# Patient Record
Sex: Male | Born: 1935 | Race: White | Hispanic: No | Marital: Married | Smoking: Never smoker
Health system: Southern US, Community
[De-identification: ages and names within clinical notes are randomized; demographics above are authoritative.]

## PROBLEM LIST (undated history)

## (undated) DIAGNOSIS — H409 Unspecified glaucoma: Secondary | ICD-10-CM

## (undated) DIAGNOSIS — D649 Anemia, unspecified: Secondary | ICD-10-CM

## (undated) DIAGNOSIS — N400 Enlarged prostate without lower urinary tract symptoms: Secondary | ICD-10-CM

## (undated) DIAGNOSIS — Z860100 Personal history of colon polyps, unspecified: Secondary | ICD-10-CM

## (undated) DIAGNOSIS — F419 Anxiety disorder, unspecified: Secondary | ICD-10-CM

## (undated) DIAGNOSIS — S0291XA Unspecified fracture of skull, initial encounter for closed fracture: Secondary | ICD-10-CM

## (undated) DIAGNOSIS — T7840XA Allergy, unspecified, initial encounter: Secondary | ICD-10-CM

## (undated) DIAGNOSIS — K219 Gastro-esophageal reflux disease without esophagitis: Secondary | ICD-10-CM

## (undated) DIAGNOSIS — N329 Bladder disorder, unspecified: Secondary | ICD-10-CM

## (undated) DIAGNOSIS — E785 Hyperlipidemia, unspecified: Secondary | ICD-10-CM

## (undated) DIAGNOSIS — K5790 Diverticulosis of intestine, part unspecified, without perforation or abscess without bleeding: Secondary | ICD-10-CM

## (undated) DIAGNOSIS — I700 Atherosclerosis of aorta: Secondary | ICD-10-CM

## (undated) DIAGNOSIS — E559 Vitamin D deficiency, unspecified: Secondary | ICD-10-CM

## (undated) DIAGNOSIS — IMO0002 Reserved for concepts with insufficient information to code with codable children: Secondary | ICD-10-CM

## (undated) DIAGNOSIS — I10 Essential (primary) hypertension: Secondary | ICD-10-CM

## (undated) DIAGNOSIS — H269 Unspecified cataract: Secondary | ICD-10-CM

## (undated) DIAGNOSIS — I495 Sick sinus syndrome: Secondary | ICD-10-CM

## (undated) DIAGNOSIS — H8309 Labyrinthitis, unspecified ear: Secondary | ICD-10-CM

## (undated) DIAGNOSIS — J309 Allergic rhinitis, unspecified: Secondary | ICD-10-CM

## (undated) DIAGNOSIS — D696 Thrombocytopenia, unspecified: Secondary | ICD-10-CM

## (undated) DIAGNOSIS — D126 Benign neoplasm of colon, unspecified: Secondary | ICD-10-CM

## (undated) HISTORY — DX: Hyperlipidemia, unspecified: E78.5

## (undated) HISTORY — DX: Personal history of colonic polyps: Z86.010

## (undated) HISTORY — DX: Unspecified cataract: H26.9

## (undated) HISTORY — DX: Reserved for concepts with insufficient information to code with codable children: IMO0002

## (undated) HISTORY — PX: TREATMENT FISTULA ANAL: SUR1390

## (undated) HISTORY — DX: Diverticulosis of intestine, part unspecified, without perforation or abscess without bleeding: K57.90

## (undated) HISTORY — DX: Thrombocytopenia, unspecified: D69.6

## (undated) HISTORY — DX: Benign prostatic hyperplasia without lower urinary tract symptoms: N40.0

## (undated) HISTORY — DX: Bladder disorder, unspecified: N32.9

## (undated) HISTORY — DX: Allergic rhinitis, unspecified: J30.9

## (undated) HISTORY — DX: Atherosclerosis of aorta: I70.0

## (undated) HISTORY — PX: COLONOSCOPY: SHX174

## (undated) HISTORY — PX: OTHER SURGICAL HISTORY: SHX169

## (undated) HISTORY — DX: Personal history of colon polyps, unspecified: Z86.0100

## (undated) HISTORY — DX: Benign neoplasm of colon, unspecified: D12.6

## (undated) HISTORY — DX: Unspecified glaucoma: H40.9

## (undated) HISTORY — PX: COLON SURGERY: SHX602

## (undated) HISTORY — DX: Vitamin D deficiency, unspecified: E55.9

## (undated) HISTORY — DX: Allergy, unspecified, initial encounter: T78.40XA

## (undated) HISTORY — DX: Labyrinthitis, unspecified ear: H83.09

## (undated) HISTORY — DX: Unspecified fracture of skull, initial encounter for closed fracture: S02.91XA

## (undated) HISTORY — PX: EYE SURGERY: SHX253

---
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
## 2000-10-10 ENCOUNTER — Ambulatory Visit (HOSPITAL_COMMUNITY): Admission: RE | Admit: 2000-10-10 | Discharge: 2000-10-10 | Payer: Self-pay | Admitting: Family Medicine

## 2000-10-10 ENCOUNTER — Encounter: Payer: Self-pay | Admitting: Family Medicine

## 2001-12-28 ENCOUNTER — Encounter: Payer: Self-pay | Admitting: Family Medicine

## 2001-12-28 ENCOUNTER — Encounter: Admission: RE | Admit: 2001-12-28 | Discharge: 2001-12-28 | Payer: Self-pay | Admitting: Family Medicine

## 2003-04-18 ENCOUNTER — Ambulatory Visit (HOSPITAL_COMMUNITY): Admission: RE | Admit: 2003-04-18 | Discharge: 2003-04-18 | Payer: Self-pay | Admitting: Gastroenterology

## 2008-04-09 ENCOUNTER — Encounter: Payer: Self-pay | Admitting: Internal Medicine

## 2008-04-09 ENCOUNTER — Ambulatory Visit: Payer: Self-pay | Admitting: Internal Medicine

## 2008-04-24 ENCOUNTER — Ambulatory Visit: Payer: Self-pay | Admitting: Internal Medicine

## 2008-04-24 ENCOUNTER — Encounter: Payer: Self-pay | Admitting: Internal Medicine

## 2008-04-24 DIAGNOSIS — Z860100 Personal history of colon polyps, unspecified: Secondary | ICD-10-CM | POA: Insufficient documentation

## 2008-04-25 ENCOUNTER — Encounter: Payer: Self-pay | Admitting: Internal Medicine

## 2010-10-25 ENCOUNTER — Encounter: Admission: RE | Admit: 2010-10-25 | Discharge: 2010-10-25 | Payer: Self-pay | Admitting: Family Medicine

## 2010-11-03 ENCOUNTER — Encounter: Admission: RE | Admit: 2010-11-03 | Discharge: 2010-11-03 | Payer: Self-pay | Admitting: Family Medicine

## 2011-04-22 NOTE — Op Note (Signed)
   Paul Parks, Paul Parks                          ACCOUNT NO.:  [id number]   MEDICAL RECORD NO.:  [id number]                   PATIENT TYPE:  AMB   LOCATION:  ENDO                                 FACILITY:  [facility]   PHYSICIAN:  John C. Madilyn Fireman, M.D.                 DATE OF BIRTH:  09/04/1936   DATE OF PROCEDURE:  04/18/2003  DATE OF DISCHARGE:                                 OPERATIVE REPORT   INDICATIONS FOR PROCEDURE:  Family history of colon cancer in a first-degree  relative.   PROCEDURE:  The patient was placed in the left lateral decubitus position  and placed on the pulse monitor with continuous low-flow oxygen delivered by  nasal cannula.  He was sedated with 50 mcg IV fentanyl and 4 mg IV Versed.  The Olympus video colonoscope was inserted into the rectum and advanced to  the cecum, confirmed by transillumination at McBurney's point and  visualization of the ileocecal valve and appendiceal orifice.  The prep was  excellent.  The cecum, ascending, transverse, descending, and sigmoid colon  all appeared normal with no masses, polyps, diverticula, or other mucosal  abnormalities.  The rectum likewise appeared normal and retroflex view of  the anus revealed no obvious internal hemorrhoids.  The colonoscope was then  withdrawn and the patient returned to the recovery room in stable condition.  He tolerated the procedure well and there were no immediate complications.   IMPRESSION:  Normal colonoscopy.   PLAN:  Repeat colonoscopy in five years based on family history.                                               John C. Madilyn Fireman, M.D.    [facility]MED[facility]  D:  04/18/2003  T:  04/18/2003  Job:  [id number]   cc:   Ernestina Penna, M.D.  [address] [place]  [place] [address]  Fax: [phone]

## 2012-07-24 ENCOUNTER — Other Ambulatory Visit: Payer: Self-pay | Admitting: Ophthalmology

## 2012-07-24 DIAGNOSIS — H5340 Unspecified visual field defects: Secondary | ICD-10-CM

## 2012-07-30 ENCOUNTER — Other Ambulatory Visit: Payer: Self-pay | Admitting: Ophthalmology

## 2012-07-30 DIAGNOSIS — H5340 Unspecified visual field defects: Secondary | ICD-10-CM

## 2012-07-31 ENCOUNTER — Ambulatory Visit
Admission: RE | Admit: 2012-07-31 | Discharge: 2012-07-31 | Disposition: A | Discharge status: Home / Self Care | Payer: Medicare Other | Source: Ambulatory Visit | Attending: Ophthalmology | Admitting: Ophthalmology

## 2012-07-31 DIAGNOSIS — H5340 Unspecified visual field defects: Secondary | ICD-10-CM

## 2012-07-31 MED ORDER — GADOBENATE DIMEGLUMINE 529 MG/ML IV SOLN
17.0000 mL | Freq: Once | INTRAVENOUS | Status: AC | PRN
  Administered 2012-07-31: 17 mL via INTRAVENOUS

## 2013-03-12 ENCOUNTER — Ambulatory Visit (INDEPENDENT_AMBULATORY_CARE_PROVIDER_SITE_OTHER): Payer: Medicare Other

## 2013-03-12 ENCOUNTER — Encounter: Payer: Self-pay | Admitting: Family Medicine

## 2013-03-12 ENCOUNTER — Ambulatory Visit (INDEPENDENT_AMBULATORY_CARE_PROVIDER_SITE_OTHER): Payer: Medicare Other | Admitting: Family Medicine

## 2013-03-12 VITALS — BP 118/60 | HR 69 | Temp 97.3°F | Ht 69.25 in | Wt 196.0 lb

## 2013-03-12 DIAGNOSIS — E785 Hyperlipidemia, unspecified: Secondary | ICD-10-CM | POA: Insufficient documentation

## 2013-03-12 DIAGNOSIS — S81812A Laceration without foreign body, left lower leg, initial encounter: Secondary | ICD-10-CM

## 2013-03-12 DIAGNOSIS — M79609 Pain in unspecified limb: Secondary | ICD-10-CM

## 2013-03-12 DIAGNOSIS — M79605 Pain in left leg: Secondary | ICD-10-CM

## 2013-03-12 DIAGNOSIS — S81809A Unspecified open wound, unspecified lower leg, initial encounter: Secondary | ICD-10-CM

## 2013-03-12 MED ORDER — ROSUVASTATIN CALCIUM 10 MG PO TABS: 10.0000 mg | ORAL_TABLET | Freq: Every day | ORAL | Status: DC

## 2013-03-12 MED ORDER — CEPHALEXIN 500 MG PO CAPS: 500.0000 mg | ORAL_CAPSULE | Freq: Three times a day (TID) | ORAL | Status: DC

## 2013-03-12 NOTE — Patient Instructions (Signed)
Elevate leg as much as possible Cleanse daily with Betadine solution Do not use Neosporin ointment Bactroban ointment or Polysporin ointment would be okay to place on cut

## 2013-03-12 NOTE — Addendum Note (Signed)
Addended by: Gwenith Daily on: 03/12/2013 03:25 PM   Modules accepted: Orders
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
## 2013-03-19 ENCOUNTER — Encounter: Payer: Self-pay | Admitting: Family Medicine

## 2013-03-19 ENCOUNTER — Ambulatory Visit (INDEPENDENT_AMBULATORY_CARE_PROVIDER_SITE_OTHER): Payer: Medicare Other | Admitting: Family Medicine

## 2013-03-19 ENCOUNTER — Ambulatory Visit
Admission: RE | Admit: 2013-03-19 | Discharge: 2013-03-19 | Disposition: A | Discharge status: Home / Self Care | Payer: 59 | Source: Ambulatory Visit | Attending: Family Medicine | Admitting: Family Medicine

## 2013-03-19 VITALS — BP 126/72 | HR 72 | Temp 97.7°F | Ht 69.25 in | Wt 198.6 lb

## 2013-03-19 DIAGNOSIS — M79609 Pain in unspecified limb: Secondary | ICD-10-CM

## 2013-03-19 DIAGNOSIS — M79662 Pain in left lower leg: Secondary | ICD-10-CM

## 2013-03-19 DIAGNOSIS — M79605 Pain in left leg: Secondary | ICD-10-CM

## 2013-03-19 NOTE — Patient Instructions (Signed)
Get Doppler study Elevate leg as directed finish Keflex

## 2013-03-19 NOTE — Progress Notes (Signed)
  Subjective:    Patient ID: Paul Parks, male    DOB: 1936-11-07, 77 y.o.   MRN: [id number]  HPI [name] returns today for a followup of his left leg injury from over a week ago. He indicates that that it is better but there is still a lot of swelling and especially pain after arising in the morning and after arising after sitting.   Review of Systems See above.No  chest pain no shortness of breath.    Objective:   Physical Exam The left lower leg is still swollen and slightly warm to touch. The abrasion appears to be healing without any sign of any infection. There is bruising in the distal toes. There is a good dorsalis pedis pulse. The leg and calf are tender to palpation and to touch    Assessment & Plan:  This is a recheck of the left leg injury  Persistent edema  Persistent pain in calf and leg  We'll schedule venous Dopplers. Continue to elevate leg several times during the day Continue and finish Keflex Will call patient once Doppler results are available

## 2013-05-20 ENCOUNTER — Encounter: Payer: Self-pay | Admitting: Internal Medicine

## 2013-05-30 ENCOUNTER — Encounter: Payer: Self-pay | Admitting: Family Medicine

## 2013-05-30 ENCOUNTER — Ambulatory Visit (INDEPENDENT_AMBULATORY_CARE_PROVIDER_SITE_OTHER): Payer: Medicare Other | Admitting: Family Medicine

## 2013-05-30 VITALS — BP 140/70 | HR 54 | Temp 97.1°F | Ht 69.25 in | Wt 190.0 lb

## 2013-05-30 DIAGNOSIS — J309 Allergic rhinitis, unspecified: Secondary | ICD-10-CM

## 2013-05-30 DIAGNOSIS — N400 Enlarged prostate without lower urinary tract symptoms: Secondary | ICD-10-CM

## 2013-05-30 DIAGNOSIS — E785 Hyperlipidemia, unspecified: Secondary | ICD-10-CM

## 2013-05-30 DIAGNOSIS — Z8739 Personal history of other diseases of the musculoskeletal system and connective tissue: Secondary | ICD-10-CM

## 2013-05-30 DIAGNOSIS — R5383 Other fatigue: Secondary | ICD-10-CM

## 2013-05-30 DIAGNOSIS — H8309 Labyrinthitis, unspecified ear: Secondary | ICD-10-CM

## 2013-05-30 DIAGNOSIS — R5381 Other malaise: Secondary | ICD-10-CM

## 2013-05-30 DIAGNOSIS — H8303 Labyrinthitis, bilateral: Secondary | ICD-10-CM

## 2013-05-30 DIAGNOSIS — E559 Vitamin D deficiency, unspecified: Secondary | ICD-10-CM

## 2013-05-30 LAB — POCT CBC
Granulocyte percent: 50.7 %G (ref 37–80)
HCT, POC: 41.3 % — AB (ref 43.5–53.7)
Hemoglobin: 14.8 g/dL (ref 14.1–18.1)
Lymph, poc: 2.3 (ref 0.6–3.4)
MCHC: 35.9 g/dL — AB (ref 31.8–35.4)
MPV: 7.2 fL (ref 0–99.8)
POC Granulocyte: 2.6 (ref 2–6.9)
RDW, POC: 12.6 %

## 2013-05-30 LAB — HEPATIC FUNCTION PANEL
Albumin: 4.2 g/dL (ref 3.5–5.2)
Alkaline Phosphatase: 47 U/L (ref 39–117)
Bilirubin, Direct: 0.2 mg/dL (ref 0.0–0.3)
Total Bilirubin: 0.8 mg/dL (ref 0.3–1.2)

## 2013-05-30 LAB — BASIC METABOLIC PANEL WITH GFR
Calcium: 9.4 mg/dL (ref 8.4–10.5)
GFR, Est African American: 75 mL/min
GFR, Est Non African American: 65 mL/min
Potassium: 4.9 mEq/L (ref 3.5–5.3)
Sodium: 137 mEq/L (ref 135–145)

## 2013-05-30 MED ORDER — FLUTICASONE PROPIONATE 50 MCG/ACT NA SUSP: NASAL | Status: DC

## 2013-05-30 NOTE — Patient Instructions (Addendum)
Fall precautions discussed Continue current meds and therapeutic lifestyle changes Use Flonase more regularly 1-2 sprays each nostril at bedtime Take Claritin more regularly one daily in the morning We will see if this regular use of these 2 medications helps with the recurrent dizziness
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
## 2013-05-31 LAB — NMR LIPOPROFILE WITH LIPIDS
HDL Size: 9.2 nm (ref >=9.2)
HDL-C: 48 mg/dL (ref >=40)
LDL (calc): 68 mg/dL (ref <100)
LDL Size: 20.4 nm — ABNORMAL LOW (ref >20.5)
LP-IR Score: <25 (ref <=45)
Triglycerides: 100 mg/dL (ref <150)

## 2013-05-31 LAB — VITAMIN D 25 HYDROXY (VIT D DEFICIENCY, FRACTURES): Vit D, 25-Hydroxy: 38 ng/mL (ref 30–89)

## 2013-07-29 ENCOUNTER — Other Ambulatory Visit: Payer: Self-pay | Admitting: Family Medicine

## 2013-09-24 ENCOUNTER — Other Ambulatory Visit: Payer: Self-pay | Admitting: Family Medicine

## 2013-10-01 ENCOUNTER — Ambulatory Visit: Payer: Medicare Other | Admitting: Family Medicine

## 2013-10-08 ENCOUNTER — Ambulatory Visit (INDEPENDENT_AMBULATORY_CARE_PROVIDER_SITE_OTHER): Payer: Medicare Other | Admitting: Family Medicine

## 2013-10-08 ENCOUNTER — Encounter: Payer: Self-pay | Admitting: Family Medicine

## 2013-10-08 ENCOUNTER — Ambulatory Visit (INDEPENDENT_AMBULATORY_CARE_PROVIDER_SITE_OTHER): Payer: Medicare Other

## 2013-10-08 VITALS — BP 120/69 | HR 51 | Temp 98.2°F | Ht 69.25 in | Wt 183.0 lb

## 2013-10-08 DIAGNOSIS — M5137 Other intervertebral disc degeneration, lumbosacral region: Secondary | ICD-10-CM

## 2013-10-08 DIAGNOSIS — H8309 Labyrinthitis, unspecified ear: Secondary | ICD-10-CM

## 2013-10-08 DIAGNOSIS — Z23 Encounter for immunization: Secondary | ICD-10-CM

## 2013-10-08 DIAGNOSIS — M5136 Other intervertebral disc degeneration, lumbar region: Secondary | ICD-10-CM | POA: Insufficient documentation

## 2013-10-08 DIAGNOSIS — E559 Vitamin D deficiency, unspecified: Secondary | ICD-10-CM

## 2013-10-08 DIAGNOSIS — N400 Enlarged prostate without lower urinary tract symptoms: Secondary | ICD-10-CM

## 2013-10-08 DIAGNOSIS — E785 Hyperlipidemia, unspecified: Secondary | ICD-10-CM

## 2013-10-08 LAB — POCT CBC
Lymph, poc: 2.4 (ref 0.6–3.4)
MCH, POC: 30.5 pg (ref 27–31.2)
MCHC: 34.7 g/dL (ref 31.8–35.4)
MCV: 88.1 fL (ref 80–97)
Platelet Count, POC: 134 10*3/uL — AB (ref 142–424)
RBC: 4.8 M/uL (ref 4.69–6.13)
RDW, POC: 12.4 %
WBC: 5.8 10*3/uL (ref 4.6–10.2)

## 2013-10-08 NOTE — Progress Notes (Addendum)
Subjective:    Patient ID: Paul Parks, male    DOB: 02-23-1936, 77 y.o.   MRN: [id number]  HPI Pt here for follow up and management of chronic medical problems. He does complain of urgency. He has a visit scheduled with the urologist in January. He does not complain of any burning.     Patient Active Problem List   Diagnosis Date Noted  . BPH (benign prostatic hypertrophy) 05/30/2013  . Labyrinthitis 05/30/2013  . Hyperlipemia 03/12/2013   Outpatient Encounter Prescriptions as of 10/08/2013  Medication Sig  . aspirin 81 MG tablet Take 81 mg by mouth daily.  . beta carotene w/minerals (OCUVITE) tablet Take 1 tablet by mouth daily.  . cholecalciferol (VITAMIN D) 1000 UNITS tablet Take 1,000 Units by mouth daily.  . fluticasone (FLONASE) 50 MCG/ACT nasal spray INSTILL 1 SPRAY IN EACH NOSTRIL DAILY  . meloxicam (MOBIC) 15 MG tablet TAKE 1/2 TO 1 TABLET BY MOUTH EVERY DAY AFTER MEALS  . rosuvastatin (CRESTOR) 10 MG tablet Take 1 tablet (10 mg total) by mouth daily. 1/2 qd  . dutasteride (AVODART) 0.5 MG capsule Take 0.5 mg by mouth daily. qod    Review of Systems  Constitutional: Negative.   HENT: Negative.   Eyes: Negative.   Respiratory: Negative.   Cardiovascular: Negative.   Gastrointestinal: Negative.   Endocrine: Negative.   Genitourinary: Positive for urgency (dr Annabell Howells- last visit 1/14).  Musculoskeletal: Negative.   Skin: Negative.   Allergic/Immunologic: Negative.   Neurological: Positive for dizziness.  Hematological: Negative.   Psychiatric/Behavioral: Negative.        Objective:   Physical Exam  Nursing note and vitals reviewed. Constitutional: He is oriented to person, place, and time. He appears well-developed and well-nourished. No distress.  HENT:  Head: Normocephalic and atraumatic.  Right Ear: External ear normal.  Left Ear: External ear normal.  Nose: Nose normal.  Mouth/Throat: Oropharynx is clear and moist. No oropharyngeal exudate.  Eyes:  Conjunctivae and EOM are normal. Pupils are equal, round, and reactive to light. Right eye exhibits no discharge. Left eye exhibits no discharge. No scleral icterus.  Neck: Normal range of motion. Neck supple. No tracheal deviation present. No thyromegaly present.  No carotid bruit  Cardiovascular: Normal rate, regular rhythm, normal heart sounds and intact distal pulses.  Exam reveals no gallop and no friction rub.   No murmur heard. At 60 per minute  Pulmonary/Chest: Effort normal and breath sounds normal. No respiratory distress. He has no wheezes. He has no rales. He exhibits no tenderness.  Abdominal: Soft. Bowel sounds are normal. He exhibits no mass. There is no tenderness. There is no rebound and no guarding.  Musculoskeletal: Normal range of motion. He exhibits no edema and no tenderness.  Lymphadenopathy:    He has no cervical adenopathy.  Neurological: He is alert and oriented to person, place, and time. He has normal reflexes. No cranial nerve deficit.  Skin: Skin is warm and dry. No rash noted. No erythema. No pallor.  Psychiatric: He has a normal mood and affect. His behavior is normal. Judgment and thought content normal.   BP 120/69  Pulse 51  Temp(Src) 98.2 F (36.8 C) (Oral)  Ht 5' 9.25" (1.759 m)  Wt 183 lb (83.008 kg)  BMI 26.83 kg/m2  [facility] reading (PRIMARY) by  Dr. Christell Constant; chest x-ray--within normal limits  Assessment & Plan:   1. Hyperlipemia   2. BPH (benign prostatic hypertrophy)   3. Vitamin D deficiency   4. Degenerative disc disease, lumbar   5. Labyrinthitis, unspecified laterality    Orders Placed This Encounter  Procedures  . DG Chest 2 View    Standing Status: Future     Number of Occurrences: 1     Standing Expiration Date: 12/08/2014    Order Specific Question:  Reason for Exam (SYMPTOM  OR DIAGNOSIS REQUIRED)    Answer:  hyperlipidemia    Order Specific Question:  Preferred imaging location?    Answer:   Internal  . Hepatic function panel  . BMP8+EGFR  . NMR, lipoprofile  . Vit D  25 hydroxy (rtn osteoporosis monitoring)  . POCT CBC   Meds ordered this encounter  Medications  . cholecalciferol (VITAMIN D) 1000 UNITS tablet    Sig: Take 1,000 Units by mouth daily.  Marland Kitchen aspirin 81 MG tablet    Sig: Take 81 mg by mouth daily.  . beta carotene w/minerals (OCUVITE) tablet    Sig: Take 1 tablet by mouth daily.   Patient Instructions  Continue current medications. Continue good therapeutic lifestyle changes.  Fall precautions discussed with patient. Follow up as planned and earlier as needed. Take Claritin more regularly Followup with the urologist as planned We will call you with the results of your lab work and chest x-ray when these are available We will also try to arrange an exercise stress test with cardiology     Nyra Capes MD

## 2013-10-08 NOTE — Patient Instructions (Addendum)
Continue current medications. Continue good therapeutic lifestyle changes.  Fall precautions discussed with patient. Follow up as planned and earlier as needed. Take Claritin more regularly Followup with the urologist as planned We will call you with the results of your lab work and chest x-ray when these are available We will also try to arrange an exercise stress test with cardiology

## 2013-10-09 ENCOUNTER — Telehealth: Payer: Self-pay | Admitting: *Deleted

## 2013-10-09 NOTE — Telephone Encounter (Signed)
error 

## 2013-10-10 LAB — BMP8+EGFR
BUN/Creatinine Ratio: 12 (ref 10–22)
BUN: 13 mg/dL (ref 8–27)
CO2: 25 mmol/L (ref 18–29)
Chloride: 99 mmol/L (ref 97–108)
Creatinine, Ser: 1.05 mg/dL (ref 0.76–1.27)
GFR calc non Af Amer: 69 mL/min/{1.73_m2} (ref >59)
Glucose: 95 mg/dL (ref 65–99)
Potassium: 4.9 mmol/L (ref 3.5–5.2)

## 2013-10-10 LAB — HEPATIC FUNCTION PANEL
AST: 21 IU/L (ref 0–40)
Albumin: 4.5 g/dL (ref 3.5–4.8)
Alkaline Phosphatase: 55 IU/L (ref 39–117)
Total Protein: 6.2 g/dL (ref 6.0–8.5)

## 2013-10-10 LAB — NMR, LIPOPROFILE
HDL Cholesterol by NMR: 57 mg/dL (ref >=40)
HDL Particle Number: 30.6 umol/L (ref >=30.5)
LDL Particle Number: 971 nmol/L (ref <1000)
LDLC SERPL CALC-MCNC: 79 mg/dL (ref <100)
LP-IR Score: <25 (ref <=45)

## 2013-10-10 LAB — VITAMIN D 25 HYDROXY (VIT D DEFICIENCY, FRACTURES): Vit D, 25-Hydroxy: 32 ng/mL (ref 30.0–100.0)

## 2013-11-05 ENCOUNTER — Encounter (HOSPITAL_COMMUNITY): Payer: 59

## 2013-11-11 ENCOUNTER — Ambulatory Visit: Payer: Medicare Other | Admitting: Family Medicine

## 2013-11-12 ENCOUNTER — Ambulatory Visit (HOSPITAL_COMMUNITY)
Admission: RE | Admit: 2013-11-12 | Discharge: 2013-11-12 | Disposition: A | Discharge status: Home / Self Care | Payer: 59 | Source: Ambulatory Visit | Attending: Cardiovascular Disease | Admitting: Cardiovascular Disease

## 2013-11-12 DIAGNOSIS — I4949 Other premature depolarization: Secondary | ICD-10-CM | POA: Insufficient documentation

## 2013-11-12 DIAGNOSIS — E785 Hyperlipidemia, unspecified: Secondary | ICD-10-CM

## 2013-12-09 ENCOUNTER — Other Ambulatory Visit: Payer: Self-pay | Admitting: Family Medicine

## 2013-12-11 NOTE — Telephone Encounter (Signed)
Not on med list

## 2013-12-27 ENCOUNTER — Encounter: Payer: Self-pay | Admitting: Internal Medicine

## 2014-01-15 ENCOUNTER — Ambulatory Visit (INDEPENDENT_AMBULATORY_CARE_PROVIDER_SITE_OTHER): Payer: Medicare Other | Admitting: Family Medicine

## 2014-01-15 ENCOUNTER — Encounter: Payer: Self-pay | Admitting: Family Medicine

## 2014-01-15 VITALS — BP 117/72 | HR 55 | Temp 98.6°F | Ht 69.25 in | Wt 188.0 lb

## 2014-01-15 DIAGNOSIS — E785 Hyperlipidemia, unspecified: Secondary | ICD-10-CM

## 2014-01-15 DIAGNOSIS — N400 Enlarged prostate without lower urinary tract symptoms: Secondary | ICD-10-CM

## 2014-01-15 DIAGNOSIS — J069 Acute upper respiratory infection, unspecified: Secondary | ICD-10-CM

## 2014-01-15 DIAGNOSIS — Z139 Encounter for screening, unspecified: Secondary | ICD-10-CM

## 2014-01-15 DIAGNOSIS — J029 Acute pharyngitis, unspecified: Secondary | ICD-10-CM

## 2014-01-15 DIAGNOSIS — E559 Vitamin D deficiency, unspecified: Secondary | ICD-10-CM

## 2014-01-15 LAB — POCT CBC
GRANULOCYTE PERCENT: 40.6 % (ref 37–80)
HEMATOCRIT: 43.9 % (ref 43.5–53.7)
HEMOGLOBIN: 14.9 g/dL (ref 14.1–18.1)
LYMPH, POC: 2.6 (ref 0.6–3.4)
MCH, POC: 30.2 pg (ref 27–31.2)
MCHC: 33.8 g/dL (ref 31.8–35.4)
MCV: 89.2 fL (ref 80–97)
MPV: 7.3 fL (ref 0–99.8)
POC Granulocyte: 1.9 — AB (ref 2–6.9)
POC LYMPH PERCENT: 54.9 %L — AB (ref 10–50)
Platelet Count, POC: 102 10*3/uL — AB (ref 142–424)
RBC: 4.9 M/uL (ref 4.69–6.13)
RDW, POC: 12.6 %
WBC: 4.7 10*3/uL (ref 4.6–10.2)

## 2014-01-15 LAB — POCT INFLUENZA A/B
Influenza A, POC: NEGATIVE
Influenza B, POC: NEGATIVE

## 2014-01-15 MED ORDER — AMOXICILLIN 500 MG PO CAPS: 500.0000 mg | ORAL_CAPSULE | Freq: Three times a day (TID) | ORAL | Status: DC

## 2014-01-15 NOTE — Addendum Note (Signed)
Addended by: Zannie Cove on: 01/15/2014 10:51 AM   Modules accepted: Orders

## 2014-01-15 NOTE — Patient Instructions (Addendum)
Continue current medications. Continue good therapeutic lifestyle changes which include good diet and exercise. Fall precautions discussed with patient. Schedule your flu vaccine if you haven't had it yet If you are over 76 years old - you may need Prevnar 41 or the adult Pneumonia vaccine. Take Tylenol for aches pains and fever Drink plenty of fluids Use Mucinex maximum strength, blue and white in color one twice daily for cough and congestion with a large glass of water Take prescribed medication as directed Continued followup with the urologist Do not forget to return and get your Prevnar vaccine when you're feeling better Get your colonoscopy as planned

## 2014-01-15 NOTE — Addendum Note (Signed)
Addended by: Pollyann Kennedy F on: 01/15/2014 10:00 AM   Modules accepted: Orders

## 2014-01-15 NOTE — Progress Notes (Signed)
Subjective:    Patient ID: Paul Parks, male    DOB: 10-08-1936, 78 y.o.   MRN: [id number]  HPI Pt here for follow up and management of chronic medical problems.         Patient Active Problem List   Diagnosis Date Noted  . Degenerative disc disease, lumbar 10/08/2013  . BPH (benign prostatic hypertrophy) 05/30/2013  . Labyrinthitis 05/30/2013  . Hyperlipemia 03/12/2013   Outpatient Encounter Prescriptions as of 01/15/2014  Medication Sig  . aspirin 81 MG tablet Take 81 mg by mouth daily.  . cholecalciferol (VITAMIN D) 1000 UNITS tablet Take 1,000 Units by mouth daily.  Marland Kitchen dutasteride (AVODART) 0.5 MG capsule Take 0.5 mg by mouth daily. qod  . fluticasone (FLONASE) 50 MCG/ACT nasal spray INSTILL 1 SPRAY IN EACH NOSTRIL DAILY  . meclizine (ANTIVERT) 12.5 MG tablet TAKE 1 TABLET 4 TIMES A DAY WITH MEALS AND AT BEDTIME FOR DIZZINESS  . meloxicam (MOBIC) 15 MG tablet TAKE 1/2 TO 1 TABLET BY MOUTH EVERY DAY AFTER MEALS  . Multiple Vitamin (MULTIVITAMIN WITH MINERALS) TABS tablet Take 1 tablet by mouth daily.  . rosuvastatin (CRESTOR) 10 MG tablet Take 1 tablet (10 mg total) by mouth daily. 1/2 qd  . [DISCONTINUED] beta carotene w/minerals (OCUVITE) tablet Take 1 tablet by mouth daily.    Review of Systems  Constitutional: Negative.  Negative for fever.  HENT: Positive for congestion (x 1 week).   Eyes: Negative.   Respiratory: Positive for cough.   Cardiovascular: Negative.   Gastrointestinal: Negative.   Endocrine: Negative.   Genitourinary: Negative.   Musculoskeletal: Negative.   Skin: Negative.   Allergic/Immunologic: Negative.   Neurological: Negative.   Hematological: Negative.   Psychiatric/Behavioral: Negative.        Objective:   Physical Exam  Nursing note and vitals reviewed. Constitutional: He is oriented to person, place, and time. He appears well-developed and well-nourished. No distress.  Patient feels weak and achy and fatigued today do to a  respiratory infection and head congestion that have been going on for about a week.  HENT:  Head: Normocephalic and atraumatic.  Right Ear: External ear normal.  Left Ear: External ear normal.  Mouth/Throat: Oropharynx is clear and moist. No oropharyngeal exudate.  Patient has nasal congestion bilaterally, a red posterior throat, and bilateral ethmoid and maxillary sinus tenderness.  Eyes: Conjunctivae and EOM are normal. Pupils are equal, round, and reactive to light. Right eye exhibits no discharge. Left eye exhibits no discharge. No scleral icterus.  Neck: Normal range of motion. Neck supple. No thyromegaly present.  No carotid bruits  Cardiovascular: Normal rate, regular rhythm, normal heart sounds and intact distal pulses.  Exam reveals no gallop and no friction rub.   No murmur heard. At 72 per minute  Pulmonary/Chest: Effort normal and breath sounds normal. No respiratory distress. He has no wheezes. He has no rales. He exhibits no tenderness.  Mostly a dry cough. No axillary nodes and no chest wall masses.  Abdominal: Soft. Bowel sounds are normal. He exhibits no mass. There is tenderness (generally slightly tender). There is no rebound and no guarding.  Genitourinary: Penis normal.  No inguinal hernia palpated on either side and no abdominal wall hernia palpated  Musculoskeletal: Normal range of motion. He exhibits no edema and no tenderness.  Lymphadenopathy:    He has no cervical adenopathy.  Neurological: He is alert and oriented to person, place, and time. He has normal reflexes. No cranial nerve deficit.  Skin:  Skin is warm and dry. No rash noted.  Psychiatric: He has a normal mood and affect. His behavior is normal. Judgment and thought content normal.   BP 117/72  Pulse 55  Temp(Src) 98.6 F (37 C) (Oral)  Ht 5' 9.25" (1.759 m)  Wt 188 lb (85.276 kg)  BMI 27.56 kg/m2        Assessment & Plan:  1. BPH (benign prostatic hypertrophy) - POCT CBC  2.  Hyperlipemia - POCT CBC - BMP8+EGFR - Hepatic function panel - NMR, lipoprofile  3. Vitamin D deficiency - POCT CBC - Vit D  25 hydroxy (rtn osteoporosis monitoring)  4. URI (upper respiratory infection) - amoxicillin (AMOXIL) 500 MG capsule; Take 1 capsule (500 mg total) by mouth 3 (three) times daily.  Dispense: 30 capsule; Refill: 0  Patient Instructions  Continue current medications. Continue good therapeutic lifestyle changes which include good diet and exercise. Fall precautions discussed with patient. Schedule your flu vaccine if you haven't had it yet If you are over 33 years old - you may need Prevnar 34 or the adult Pneumonia vaccine. Take Tylenol for aches pains and fever Drink plenty of fluids Use Mucinex maximum strength, blue and white in color one twice daily for cough and congestion with a large glass of water Take prescribed medication as directed Continued followup with the urologist Do not forget to return and get your Prevnar vaccine when you're feeling better Get your colonoscopy as planned   Arrie Senate MD

## 2014-01-18 LAB — HEPATIC FUNCTION PANEL
ALBUMIN: 4.5 g/dL (ref 3.5–4.8)
ALK PHOS: 60 IU/L (ref 39–117)
ALT: 17 IU/L (ref 0–44)
AST: 25 IU/L (ref 0–40)
BILIRUBIN DIRECT: 0.13 mg/dL (ref 0.00–0.40)
BILIRUBIN TOTAL: 0.4 mg/dL (ref 0.0–1.2)
Total Protein: 6.7 g/dL (ref 6.0–8.5)

## 2014-01-18 LAB — BMP8+EGFR
BUN/Creatinine Ratio: 11 (ref 10–22)
BUN: 11 mg/dL (ref 8–27)
CO2: 28 mmol/L (ref 18–29)
CREATININE: 1.04 mg/dL (ref 0.76–1.27)
Calcium: 9.4 mg/dL (ref 8.6–10.2)
Chloride: 98 mmol/L (ref 97–108)
GFR, EST AFRICAN AMERICAN: 81 mL/min/{1.73_m2} (ref >59)
GFR, EST NON AFRICAN AMERICAN: 70 mL/min/{1.73_m2} (ref >59)
Glucose: 95 mg/dL (ref 65–99)
Potassium: 5 mmol/L (ref 3.5–5.2)
SODIUM: 140 mmol/L (ref 134–144)

## 2014-01-18 LAB — NMR, LIPOPROFILE
Cholesterol: 147 mg/dL (ref <200)
HDL CHOLESTEROL BY NMR: 51 mg/dL (ref >=40)
HDL PARTICLE NUMBER: 29.6 umol/L — AB (ref >=30.5)
LDL Particle Number: 1080 nmol/L — ABNORMAL HIGH (ref <1000)
LDL SIZE: 20.4 nm — AB (ref >20.5)
LDLC SERPL CALC-MCNC: 68 mg/dL (ref <100)
LP-IR Score: 41 (ref <=45)
SMALL LDL PARTICLE NUMBER: 581 nmol/L — AB (ref <=527)
Triglycerides by NMR: 140 mg/dL (ref <150)

## 2014-01-18 LAB — VITAMIN D 25 HYDROXY (VIT D DEFICIENCY, FRACTURES): VIT D 25 HYDROXY: 40.9 ng/mL (ref 30.0–100.0)

## 2014-01-29 ENCOUNTER — Ambulatory Visit (AMBULATORY_SURGERY_CENTER): Payer: Self-pay

## 2014-01-29 VITALS — Ht 70.5 in | Wt 183.0 lb

## 2014-01-29 DIAGNOSIS — Z860100 Personal history of colon polyps, unspecified: Secondary | ICD-10-CM

## 2014-01-29 MED ORDER — SUPREP BOWEL PREP KIT 17.5-3.13-1.6 GM/177ML PO SOLN: 1.0000 | Freq: Once | ORAL | Status: DC

## 2014-01-30 ENCOUNTER — Encounter: Payer: Self-pay | Admitting: Internal Medicine

## 2014-02-06 ENCOUNTER — Ambulatory Visit (AMBULATORY_SURGERY_CENTER): Payer: Medicare Other | Admitting: Internal Medicine

## 2014-02-06 ENCOUNTER — Encounter: Payer: Self-pay | Admitting: Internal Medicine

## 2014-02-06 VITALS — BP 127/69 | HR 48 | Temp 98.1°F | Resp 21 | Ht 70.5 in | Wt 183.0 lb

## 2014-02-06 DIAGNOSIS — Z86010 Personal history of colonic polyps: Secondary | ICD-10-CM

## 2014-02-06 DIAGNOSIS — D126 Benign neoplasm of colon, unspecified: Secondary | ICD-10-CM

## 2014-02-06 DIAGNOSIS — K5730 Diverticulosis of large intestine without perforation or abscess without bleeding: Secondary | ICD-10-CM

## 2014-02-06 MED ORDER — SODIUM CHLORIDE 0.9 % IV SOLN: 500.0000 mL | INTRAVENOUS | Status: DC

## 2014-02-06 NOTE — Progress Notes (Signed)
Called to room to assist during endoscopic procedure.  Patient ID and intended procedure confirmed with present staff. Received instructions for my participation in the procedure from the performing physician.  

## 2014-02-06 NOTE — Progress Notes (Signed)
A/ox3 pleased with MAC, report to April RN 

## 2014-02-06 NOTE — Patient Instructions (Addendum)
I found and removed two small polyps from the colon - no signs of cancer. You also have a condition called diverticulosis - common and not usually a problem. Please read the handout provided.  I will let you know pathology results and when to have another routine colonoscopy by mail. Probably to be considered in 5 years.  I appreciate the opportunity to care for you. Gatha Mayer, MD, FACG   YOU HAD AN ENDOSCOPIC PROCEDURE TODAY AT [facility]: Refer to the procedure report that was given to you for any specific questions about what was found during the examination.  If the procedure report does not answer your questions, please call your gastroenterologist to clarify.  If you requested that your care partner not be given the details of your procedure findings, then the procedure report has been included in a sealed envelope for you to review at your convenience later.  YOU SHOULD EXPECT: Some feelings of bloating in the abdomen. Passage of more gas than usual.  Walking can help get rid of the air that was put into your GI tract during the procedure and reduce the bloating. If you had a lower endoscopy (such as a colonoscopy or flexible sigmoidoscopy) you may notice spotting of blood in your stool or on the toilet paper. If you underwent a bowel prep for your procedure, then you may not have a normal bowel movement for a few days.  DIET: Your first meal following the procedure should be a light meal and then it is ok to progress to your normal diet.  A half-sandwich or bowl of soup is an example of a good first meal.  Heavy or fried foods are harder to digest and may make you feel nauseous or bloated.  Likewise meals heavy in dairy and vegetables can cause extra gas to form and this can also increase the bloating.  Drink plenty of fluids but you should avoid alcoholic beverages for 24 hours.  ACTIVITY: Your care partner should take you home directly after the procedure.  You should  plan to take it easy, moving slowly for the rest of the day.  You can resume normal activity the day after the procedure however you should NOT DRIVE or use heavy machinery for 24 hours (because of the sedation medicines used during the test).    SYMPTOMS TO REPORT IMMEDIATELY: A gastroenterologist can be reached at any hour.  During normal business hours, 8:30 AM to 5:00 PM Monday through Friday, call [phone].  After hours and on weekends, please call the GI answering service at [phone] who will take a message and have the physician on call contact you.   Following lower endoscopy (colonoscopy or flexible sigmoidoscopy):  Excessive amounts of blood in the stool  Significant tenderness or worsening of abdominal pains  Swelling of the abdomen that is new, acute  Fever of 100F or higher  FOLLOW UP: If any biopsies were taken you will be contacted by phone or by letter within the next 1-3 weeks.  Call your gastroenterologist if you have not heard about the biopsies in 3 weeks.  Our staff will call the home number listed on your records the next business day following your procedure to check on you and address any questions or concerns that you may have at that time regarding the information given to you following your procedure. This is a courtesy call and so if there is no answer at the home number and we have  heard from you through the emergency physician on call, we will assume that you have returned to your regular daily activities without incident.  SIGNATURES/CONFIDENTIALITY: You and/or your care partner have signed paperwork which will be entered into your electronic medical record.  These signatures attest to the fact that that the information above on your After Visit Summary has been reviewed and is understood.  Full responsibility of the confidentiality of this discharge information lies with you and/or your care-partner. 

## 2014-02-06 NOTE — Op Note (Signed)
[facility]  [address] [place], [id number]   COLONOSCOPY PROCEDURE REPORT  PATIENT: Paul Parks, Paul Parks  MR#: [id number] BIRTHDATE: 1936-11-21 , 77  yrs. old GENDER: Male ENDOSCOPIST: Gatha Mayer, MD, [facility] PROCEDURE DATE:  02/06/2014 PROCEDURE:   Colonoscopy with snare polypectomy First Screening Colonoscopy - Avg.  risk and is 50 yrs.  old or older - No.  Prior Negative Screening - Now for repeat screening. N/A  History of Adenoma - Now for follow-up colonoscopy & has been > or = to 3 yrs.  Yes hx of adenoma.  Has been 3 or more years since last colonoscopy.  Polyps Removed Today? Yes. ASA CLASS:   Class II INDICATIONS:Patient's personal history of adenomatous colon polyps.  MEDICATIONS: propofol (Diprivan) 200mg  IV, MAC sedation, administered by CRNA, and These medications were titrated to patient response per physician's verbal order  DESCRIPTION OF PROCEDURE:   After the risks benefits and alternatives of the procedure were thoroughly explained, informed consent was obtained.  A digital rectal exam revealed no rectal mass.   The LB [id number]  endoscope was introduced through the anus and advanced to the cecum, which was identified by both the appendix and ileocecal valve. No adverse events experienced. The quality of the prep was Suprep good  The instrument was then slowly withdrawn as the colon was fully examined.   COLON FINDINGS: Two diminutive sessile polyps were found at the cecum and in the transverse colon.  A polypectomy was performed with a cold snare.  The resection was complete and the polyp tissue was completely retrieved.   Moderate diverticulosis was noted in the sigmoid colon.   The colon mucosa was otherwise normal.   A right colon retroflexion was performed.  Retroflexed views revealed no abnormalities. The time to cecum=1 minutes 52 seconds. Withdrawal time=11 minutes 53 seconds.  The scope was withdrawn and the procedure  completed. COMPLICATIONS: There were no complications.  ENDOSCOPIC IMPRESSION: 1.   Two diminutive sessile polyps were found at the cecum and in the transverse colon; polypectomy was performed with a cold snare 2.   Moderate diverticulosis was noted in the sigmoid colon 3.   The colon mucosa was otherwise normal - good prep - hx 2 adenomas 2009 and FHx CRCA  RECOMMENDATIONS: 1.  Timing of repeat colonoscopy will be determined by pathology findings. 2.   probably 5 years if remains vigorous  eSigned:  Gatha Mayer, MD, [facility] 02/06/2014 12:26 PM  cc: The Patient

## 2014-02-07 ENCOUNTER — Telehealth: Payer: Self-pay | Admitting: *Deleted

## 2014-02-07 NOTE — Telephone Encounter (Signed)
  Follow up Call-  Call back number 02/06/2014  Post procedure Call Back phone  # [phone]  Permission to leave phone message Yes     Patient questions:  Do you have a fever, pain , or abdominal swelling? no Pain Score  0 *  Have you tolerated food without any problems? yes  Have you been able to return to your normal activities? yes  Do you have any questions about your discharge instructions: Diet   no Medications  no Follow up visit  no  Do you have questions or concerns about your Care? no  Actions: * If pain score is 4 or above: No action needed, pain <4.  Pt. States he has had several bouts of "dizziness" after procedure.  Denies bleeding, abdominal pain, or abdominal distention..  Advised that it was unlikely this could be coming from anesthesia.  Encouraged  to call back if this becomes more of a problem today.

## 2014-02-12 ENCOUNTER — Encounter: Payer: Self-pay | Admitting: Internal Medicine

## 2014-02-12 ENCOUNTER — Ambulatory Visit (INDEPENDENT_AMBULATORY_CARE_PROVIDER_SITE_OTHER): Payer: Medicare Other | Admitting: *Deleted

## 2014-02-12 DIAGNOSIS — Z23 Encounter for immunization: Secondary | ICD-10-CM

## 2014-02-12 NOTE — Progress Notes (Signed)
Quick Note:  2 diminutive adenomas - repeat colon 2020 if fit at 73 ______

## 2014-02-24 ENCOUNTER — Other Ambulatory Visit: Payer: Self-pay | Admitting: Family Medicine

## 2014-05-08 ENCOUNTER — Other Ambulatory Visit: Payer: Self-pay

## 2014-05-19 ENCOUNTER — Encounter (INDEPENDENT_AMBULATORY_CARE_PROVIDER_SITE_OTHER): Payer: Self-pay

## 2014-05-19 ENCOUNTER — Encounter: Payer: Self-pay | Admitting: Family Medicine

## 2014-05-19 ENCOUNTER — Ambulatory Visit (INDEPENDENT_AMBULATORY_CARE_PROVIDER_SITE_OTHER): Payer: Medicare Other | Admitting: Family Medicine

## 2014-05-19 VITALS — BP 115/67 | HR 54 | Temp 98.2°F | Ht 70.5 in | Wt 186.0 lb

## 2014-05-19 DIAGNOSIS — E559 Vitamin D deficiency, unspecified: Secondary | ICD-10-CM

## 2014-05-19 DIAGNOSIS — H409 Unspecified glaucoma: Secondary | ICD-10-CM

## 2014-05-19 DIAGNOSIS — N400 Enlarged prostate without lower urinary tract symptoms: Secondary | ICD-10-CM

## 2014-05-19 DIAGNOSIS — E785 Hyperlipidemia, unspecified: Secondary | ICD-10-CM

## 2014-05-19 LAB — POCT CBC
GRANULOCYTE PERCENT: 53.3 % (ref 37–80)
HCT, POC: 42 % — AB (ref 43.5–53.7)
HEMOGLOBIN: 13.7 g/dL — AB (ref 14.1–18.1)
Lymph, poc: 2.1 (ref 0.6–3.4)
MCH, POC: 29.6 pg (ref 27–31.2)
MCHC: 32.6 g/dL (ref 31.8–35.4)
MCV: 90.8 fL (ref 80–97)
MPV: 7 fL (ref 0–99.8)
PLATELET COUNT, POC: 143 10*3/uL (ref 142–424)
POC Granulocyte: 2.7 (ref 2–6.9)
POC LYMPH PERCENT: 41.3 %L (ref 10–50)
RBC: 4.6 M/uL — AB (ref 4.69–6.13)
RDW, POC: 12.6 %
WBC: 5.1 10*3/uL (ref 4.6–10.2)

## 2014-05-19 NOTE — Addendum Note (Signed)
Addended by: Zannie Cove on: 05/19/2014 09:22 AM   Modules accepted: Orders

## 2014-05-19 NOTE — Progress Notes (Signed)
Subjective:    Patient ID: Paul Parks, male    DOB: Sep 10, 1936, 78 y.o.   MRN: [id number]  HPI Pt here for follow up and management of chronic medical problems. The patient has no specific complaints. He doesn't need any refills. His health maintenance was reviewed and this is up to date. He is to get lab work done today. He recently had a PSA and prostate exam by the urologist. He will see the urologist again in 6 months. He spent a lot of time recently helping to take care of his wife who had been in the hospital and is recuperating from a long stay there.          Patient Active Problem List   Diagnosis Date Noted  . Degenerative disc disease, lumbar 10/08/2013  . BPH (benign prostatic hypertrophy) 05/30/2013  . Labyrinthitis 05/30/2013  . Hyperlipemia 03/12/2013  . Personal history of colonic polyps - adenomas 04/24/2008   Outpatient Encounter Prescriptions as of 05/19/2014  Medication Sig  . aspirin 81 MG tablet Take 81 mg by mouth daily.  . cholecalciferol (VITAMIN D) 1000 UNITS tablet Take 2,000 Units by mouth daily.   Marland Kitchen dutasteride (AVODART) 0.5 MG capsule Take 0.5 mg by mouth daily. qod  . fluticasone (FLONASE) 50 MCG/ACT nasal spray INSTILL 1 SPRAY IN EACH NOSTRIL DAILY  . meclizine (ANTIVERT) 12.5 MG tablet TAKE 1 TABLET 4 TIMES A DAY WITH MEALS AND AT BEDTIME FOR DIZZINESS  . meloxicam (MOBIC) 15 MG tablet TAKE 1/2 TO 1 TABLET BY MOUTH EVERY DAY AFTER MEALS  . Multiple Vitamin (MULTIVITAMIN WITH MINERALS) TABS tablet Take 1 tablet by mouth daily.  . rosuvastatin (CRESTOR) 10 MG tablet Take 1 tablet (10 mg total) by mouth daily. 1/2 qd    Review of Systems  Constitutional: Negative.   HENT: Negative.   Eyes: Negative.   Respiratory: Negative.   Cardiovascular: Negative.   Gastrointestinal: Negative.   Endocrine: Negative.   Genitourinary: Negative.   Musculoskeletal: Negative.   Skin: Negative.   Allergic/Immunologic: Negative.   Neurological: Negative.     Hematological: Negative.   Psychiatric/Behavioral: Negative.        Objective:   Physical Exam  Nursing note and vitals reviewed. Constitutional: He is oriented to person, place, and time. He appears well-developed and well-nourished. No distress.  HENT:  Head: Normocephalic and atraumatic.  Right Ear: External ear normal.  Left Ear: External ear normal.  Mouth/Throat: Oropharynx is clear and moist. No oropharyngeal exudate.  Nasal congestion and turbinate swelling bilaterally  Eyes: Conjunctivae and EOM are normal. Pupils are equal, round, and reactive to light. Right eye exhibits no discharge. Left eye exhibits no discharge. No scleral icterus.  Neck: Normal range of motion. Neck supple. No thyromegaly present.  No carotid bruits  Cardiovascular: Normal rate, regular rhythm, normal heart sounds and intact distal pulses.  Exam reveals no gallop and no friction rub.   No murmur heard. At 72 per minute  Pulmonary/Chest: Effort normal and breath sounds normal. No respiratory distress. He has no wheezes. He has no rales. He exhibits no tenderness.  Abdominal: Soft. Bowel sounds are normal. He exhibits no mass. There is no tenderness. There is no rebound and no guarding.  Musculoskeletal: Normal range of motion. He exhibits no edema and no tenderness.  Lymphadenopathy:    He has no cervical adenopathy.  Neurological: He is alert and oriented to person, place, and time. He has normal reflexes. No cranial nerve deficit.  Skin: Skin is  warm and dry. No rash noted. No erythema. No pallor.  Multiple insect bites on right side of torso and right thigh  Psychiatric: He has a normal mood and affect. His behavior is normal. Judgment and thought content normal.   BP 115/67  Pulse 54  Temp(Src) 98.2 F (36.8 C) (Oral)  Ht 5' 10.5" (1.791 m)  Wt 186 lb (84.369 kg)  BMI 26.30 kg/m2        Assessment & Plan:  1. BPH (benign prostatic hypertrophy) - POCT CBC -Followed by the urologist  every six-month  2. Hyperlipemia - POCT CBC - BMP8+EGFR - Hepatic function panel - NMR, lipoprofile  3. Vitamin D deficiency - Vit D  25 hydroxy (rtn osteoporosis monitoring)  4. Insect bite  5. Allergic rhinitis  6. Chronic back pain secondary to osteoarthritis  Patient Instructions                       Medicare Annual Wellness Visit  [facility] and the medical providers at [facility] strive to bring you the best medical care.  In doing so we not only want to address your current medical conditions and concerns but also to detect new conditions early and prevent illness, disease and health-related problems.    Medicare offers a yearly Wellness Visit which allows our clinical staff to assess your need for preventative services including immunizations, lifestyle education, counseling to decrease risk of preventable diseases and screening for fall risk and other medical concerns.    This visit is provided free of charge (no copay) for all Medicare recipients. The clinical pharmacists at [facility] have begun to conduct these Wellness Visits which will also include a thorough review of all your medications.    As you primary medical provider recommend that you make an appointment for your Annual Wellness Visit if you have not done so already this year.  You may set up this appointment before you leave today or you may call back ([phone]) and schedule an appointment.  Please make sure when you call that you mention that you are scheduling your Annual Wellness Visit with the clinical pharmacist so that the appointment may be made for the proper length of time.      Tick Bite Information Ticks are insects that attach themselves to the skin and draw blood for food. There are various types of ticks. Common types include wood ticks and deer ticks. Most ticks live in shrubs and grassy areas. Ticks can climb onto your body when you make contact  with leaves or grass where the tick is waiting. The most common places on the body for ticks to attach themselves are the scalp, neck, armpits, waist, and groin. Most tick bites are harmless, but sometimes ticks carry germs that cause diseases. These germs can be spread to a person during the tick's feeding process. The chance of a disease spreading through a tick bite depends on:   The type of tick.  Time of year.   How long the tick is attached.   Geographic location.  HOW CAN YOU PREVENT TICK BITES? Take these steps to help prevent tick bites when you are outdoors:  Wear protective clothing. Long sleeves and long pants are best.   Wear white clothes so you can see ticks more easily.  Tuck your pant legs into your socks.   If walking on a trail, stay in the middle of the trail to avoid brushing against bushes.  Avoid walking through areas with long grass.  Put insect repellent on all exposed skin and along boot tops, pant legs, and sleeve cuffs.   Check clothing, hair, and skin repeatedly and before going inside.   Brush off any ticks that are not attached.  Take a shower or bath as soon as possible after being outdoors.  WHAT IS THE PROPER WAY TO REMOVE A TICK? Ticks should be removed as soon as possible to help prevent diseases caused by tick bites. 1. If latex gloves are available, put them on before trying to remove a tick.  2. Using fine-point tweezers, grasp the tick as close to the skin as possible. You may also use curved forceps or a tick removal tool. Grasp the tick as close to its head as possible. Avoid grasping the tick on its body. 3. Pull gently with steady upward pressure until the tick lets go. Do not twist the tick or jerk it suddenly. This may break off the tick's head or mouth parts. 4. Do not squeeze or crush the tick's body. This could force disease-carrying fluids from the tick into your body.  5. After the tick is removed, wash the bite area  and your hands with soap and water or other disinfectant such as alcohol. 6. Apply a small amount of antiseptic cream or ointment to the bite site.  7. Wash and disinfect any instruments that were used.  Do not try to remove a tick by applying a hot match, petroleum jelly, or fingernail polish to the tick. These methods do not work and may increase the chances of disease being spread from the tick bite.  WHEN SHOULD YOU SEEK MEDICAL CARE? Contact your health care provider if you are unable to remove a tick from your skin or if a part of the tick breaks off and is stuck in the skin.  After a tick bite, you need to be aware of signs and symptoms that could be related to diseases spread by ticks. Contact your health care provider if you develop any of the following in the days or weeks after the tick bite:  Unexplained fever.  Rash. A circular rash that appears days or weeks after the tick bite may indicate the possibility of Lyme disease. The rash may resemble a target with a bull's-eye and may occur at a different part of your body than the tick bite.  Redness and swelling in the area of the tick bite.   Tender, swollen lymph glands.   Diarrhea.   Weight loss.   Cough.   Fatigue.   Muscle, joint, or bone pain.   Abdominal pain.   Headache.   Lethargy or a change in your level of consciousness.  Difficulty walking or moving your legs.   Numbness in the legs.   Paralysis.  Shortness of breath.   Confusion.   Repeated vomiting.  Document Released: 11/18/2000 Document Revised: 09/11/2013 Document Reviewed: 05/01/2013 [facility] Patient Information 2014 [place].   Continue current medications. Continue good therapeutic lifestyle changes which include good diet and exercise. Fall precautions discussed with patient. If an FOBT was given today- please return it to our front desk. If you are over 39 years old - you may need Prevnar 70 or the adult  Pneumonia vaccine.  Monitor yourself daily for ticks   Arrie Senate MD

## 2014-05-19 NOTE — Patient Instructions (Addendum)
Medicare Annual Wellness Visit  [facility] and the medical providers at [facility] strive to bring you the best medical care.  In doing so we not only want to address your current medical conditions and concerns but also to detect new conditions early and prevent illness, disease and health-related problems.    Medicare offers a yearly Wellness Visit which allows our clinical staff to assess your need for preventative services including immunizations, lifestyle education, counseling to decrease risk of preventable diseases and screening for fall risk and other medical concerns.    This visit is provided free of charge (no copay) for all Medicare recipients. The clinical pharmacists at [facility] have begun to conduct these Wellness Visits which will also include a thorough review of all your medications.    As you primary medical provider recommend that you make an appointment for your Annual Wellness Visit if you have not done so already this year.  You may set up this appointment before you leave today or you may call back ([phone]) and schedule an appointment.  Please make sure when you call that you mention that you are scheduling your Annual Wellness Visit with the clinical pharmacist so that the appointment may be made for the proper length of time.      Tick Bite Information Ticks are insects that attach themselves to the skin and draw blood for food. There are various types of ticks. Common types include wood ticks and deer ticks. Most ticks live in shrubs and grassy areas. Ticks can climb onto your body when you make contact with leaves or grass where the tick is waiting. The most common places on the body for ticks to attach themselves are the scalp, neck, armpits, waist, and groin. Most tick bites are harmless, but sometimes ticks carry germs that cause diseases. These germs can be spread to a person during the tick's  feeding process. The chance of a disease spreading through a tick bite depends on:   The type of tick.  Time of year.   How long the tick is attached.   Geographic location.  HOW CAN YOU PREVENT TICK BITES? Take these steps to help prevent tick bites when you are outdoors:  Wear protective clothing. Long sleeves and long pants are best.   Wear white clothes so you can see ticks more easily.  Tuck your pant legs into your socks.   If walking on a trail, stay in the middle of the trail to avoid brushing against bushes.  Avoid walking through areas with long grass.  Put insect repellent on all exposed skin and along boot tops, pant legs, and sleeve cuffs.   Check clothing, hair, and skin repeatedly and before going inside.   Brush off any ticks that are not attached.  Take a shower or bath as soon as possible after being outdoors.  WHAT IS THE PROPER WAY TO REMOVE A TICK? Ticks should be removed as soon as possible to help prevent diseases caused by tick bites. 1. If latex gloves are available, put them on before trying to remove a tick.  2. Using fine-point tweezers, grasp the tick as close to the skin as possible. You may also use curved forceps or a tick removal tool. Grasp the tick as close to its head as possible. Avoid grasping the tick on its body. 3. Pull gently with steady upward pressure until the tick lets go. Do not twist the tick or jerk it suddenly. This  may break off the tick's head or mouth parts. 4. Do not squeeze or crush the tick's body. This could force disease-carrying fluids from the tick into your body.  5. After the tick is removed, wash the bite area and your hands with soap and water or other disinfectant such as alcohol. 6. Apply a small amount of antiseptic cream or ointment to the bite site.  7. Wash and disinfect any instruments that were used.  Do not try to remove a tick by applying a hot match, petroleum jelly, or fingernail polish to  the tick. These methods do not work and may increase the chances of disease being spread from the tick bite.  WHEN SHOULD YOU SEEK MEDICAL CARE? Contact your health care provider if you are unable to remove a tick from your skin or if a part of the tick breaks off and is stuck in the skin.  After a tick bite, you need to be aware of signs and symptoms that could be related to diseases spread by ticks. Contact your health care provider if you develop any of the following in the days or weeks after the tick bite:  Unexplained fever.  Rash. A circular rash that appears days or weeks after the tick bite may indicate the possibility of Lyme disease. The rash may resemble a target with a bull's-eye and may occur at a different part of your body than the tick bite.  Redness and swelling in the area of the tick bite.   Tender, swollen lymph glands.   Diarrhea.   Weight loss.   Cough.   Fatigue.   Muscle, joint, or bone pain.   Abdominal pain.   Headache.   Lethargy or a change in your level of consciousness.  Difficulty walking or moving your legs.   Numbness in the legs.   Paralysis.  Shortness of breath.   Confusion.   Repeated vomiting.  Document Released: 11/18/2000 Document Revised: 09/11/2013 Document Reviewed: 05/01/2013 [facility] Patient Information 2014 [place].   Continue current medications. Continue good therapeutic lifestyle changes which include good diet and exercise. Fall precautions discussed with patient. If an FOBT was given today- please return it to our front desk. If you are over 85 years old - you may need Prevnar 44 or the adult Pneumonia vaccine.  Monitor yourself daily for ticks

## 2014-05-20 LAB — NMR, LIPOPROFILE
Cholesterol: 163 mg/dL (ref 100–199)
HDL CHOLESTEROL BY NMR: 63 mg/dL (ref >39)
HDL Particle Number: 32 umol/L (ref >=30.5)
LDL Particle Number: 949 nmol/L (ref <1000)
LDL Size: 21.1 nm (ref >20.5)
LDLC SERPL CALC-MCNC: 84 mg/dL (ref 0–99)
LP-IR Score: <25 (ref <=45)
SMALL LDL PARTICLE NUMBER: 188 nmol/L (ref <=527)
TRIGLYCERIDES BY NMR: 78 mg/dL (ref 0–149)

## 2014-05-20 LAB — BMP8+EGFR
BUN / CREAT RATIO: 13 (ref 10–22)
BUN: 14 mg/dL (ref 8–27)
CO2: 26 mmol/L (ref 18–29)
CREATININE: 1.09 mg/dL (ref 0.76–1.27)
Calcium: 9.3 mg/dL (ref 8.6–10.2)
Chloride: 99 mmol/L (ref 97–108)
GFR calc Af Amer: 76 mL/min/{1.73_m2} (ref >59)
GFR, EST NON AFRICAN AMERICAN: 66 mL/min/{1.73_m2} (ref >59)
Glucose: 88 mg/dL (ref 65–99)
Potassium: 4.5 mmol/L (ref 3.5–5.2)
Sodium: 137 mmol/L (ref 134–144)

## 2014-05-20 LAB — HEPATIC FUNCTION PANEL
ALT: 14 IU/L (ref 0–44)
AST: 23 IU/L (ref 0–40)
Albumin: 4.5 g/dL (ref 3.5–4.8)
Alkaline Phosphatase: 52 IU/L (ref 39–117)
Bilirubin, Direct: 0.19 mg/dL (ref 0.00–0.40)
Total Bilirubin: 0.8 mg/dL (ref 0.0–1.2)
Total Protein: 6.3 g/dL (ref 6.0–8.5)

## 2014-05-20 LAB — VITAMIN D 25 HYDROXY (VIT D DEFICIENCY, FRACTURES): Vit D, 25-Hydroxy: 35.9 ng/mL (ref 30.0–100.0)

## 2014-06-13 ENCOUNTER — Ambulatory Visit: Payer: Self-pay

## 2014-07-23 ENCOUNTER — Encounter (INDEPENDENT_AMBULATORY_CARE_PROVIDER_SITE_OTHER): Payer: Self-pay

## 2014-07-23 ENCOUNTER — Encounter: Payer: Self-pay | Admitting: Family

## 2014-07-23 ENCOUNTER — Ambulatory Visit (INDEPENDENT_AMBULATORY_CARE_PROVIDER_SITE_OTHER): Payer: Medicare Other | Admitting: Family

## 2014-07-23 VITALS — BP 116/64 | HR 56 | Temp 97.6°F | Ht 70.5 in | Wt 187.4 lb

## 2014-07-23 DIAGNOSIS — S2095XA Superficial foreign body of unspecified parts of thorax, initial encounter: Secondary | ICD-10-CM

## 2014-07-23 DIAGNOSIS — Z1839 Other retained organic fragments: Secondary | ICD-10-CM

## 2014-07-23 DIAGNOSIS — W57XXXA Bitten or stung by nonvenomous insect and other nonvenomous arthropods, initial encounter: Secondary | ICD-10-CM

## 2014-07-23 DIAGNOSIS — T148 Other injury of unspecified body region: Secondary | ICD-10-CM

## 2014-07-23 DIAGNOSIS — S30851A Superficial foreign body of abdominal wall, initial encounter: Secondary | ICD-10-CM

## 2014-07-23 MED ORDER — DOXYCYCLINE HYCLATE 100 MG PO TABS: 100.0000 mg | ORAL_TABLET | Freq: Two times a day (BID) | ORAL | Status: DC

## 2014-07-23 NOTE — Progress Notes (Signed)
   Subjective:    Patient ID: Paul Parks, male    DOB: [birth year], 78 y.o.   MRN: [id number]  HPI Pt states he has a tick embedded in his lower left abdomen that he noticed Monday night. Pt states his wife attempted to remove it and thought she had it all out. However, yesterday after getting out of the shower pt states he saw something else in it and it is still red. Pt states it is slightly sore 1-2 out 10.    Review of Systems  Constitutional: Negative.   HENT: Negative.   Respiratory: Negative.   Cardiovascular: Negative.   Gastrointestinal: Negative.   Endocrine: Negative.   Genitourinary: Negative.   Musculoskeletal: Negative.   Neurological: Negative.   Hematological: Negative.   Psychiatric/Behavioral: Negative.   All other systems reviewed and are negative.      Objective:   Physical Exam  Vitals reviewed. Constitutional: He is oriented to person, place, and time. He appears well-developed and well-nourished. No distress.  Cardiovascular: Normal rate, regular rhythm, normal heart sounds and intact distal pulses.   No murmur heard. Pulmonary/Chest: Effort normal and breath sounds normal. No respiratory distress. He has no wheezes.  Abdominal: Soft. Bowel sounds are normal. He exhibits no distension. There is no tenderness.  Musculoskeletal: Normal range of motion. He exhibits no edema and no tenderness.  Neurological: He is alert and oriented to person, place, and time. He has normal reflexes. No cranial nerve deficit.  Skin: Skin is warm and dry. No rash noted. There is erythema.  Small erythemas area with a small scabbed area on lower left abdomen, area cleaned and debrided and antibiotic ointment applied   Psychiatric: He has a normal mood and affect. His behavior is normal. Judgment and thought content normal.    BP 116/64  Pulse 56  Temp(Src) 97.6 F (36.4 C) (Oral)  Ht 5' 10.5" (1.791 m)  Wt 187 lb 6.4 oz (85.004 kg)  BMI 26.50 kg/m2       Assessment  & Plan:  1. Tick bite with subsequent removal of tick -Discussed s/s of infection -Report any rash, pain, joint pain, or fever -Do not pick at area -keep clean and dry -RTo prn - doxycycline (VIBRA-TABS) 100 MG tablet; Take 1 tablet (100 mg total) by mouth 2 (two) times daily.  Dispense: 42 tablet; Refill: 0  2. Embedded tick of abdominal wall, initial encounter - doxycycline (VIBRA-TABS) 100 MG tablet; Take 1 tablet (100 mg total) by mouth 2 (two) times daily.  Dispense: 42 tablet; Refill: 0  Evelina Dun, FNP

## 2014-07-23 NOTE — Patient Instructions (Signed)
Tick Bite Information Ticks are insects that attach themselves to the skin and draw blood for food. There are various types of ticks. Common types include wood ticks and deer ticks. Most ticks live in shrubs and grassy areas. Ticks can climb onto your body when you make contact with leaves or grass where the tick is waiting. The most common places on the body for ticks to attach themselves are the scalp, neck, armpits, waist, and groin. Most tick bites are harmless, but sometimes ticks carry germs that cause diseases. These germs can be spread to a person during the tick's feeding process. The chance of a disease spreading through a tick bite depends on:   The type of tick.  Time of year.   How long the tick is attached.   Geographic location.  HOW CAN YOU PREVENT TICK BITES? Take these steps to help prevent tick bites when you are outdoors:  Wear protective clothing. Long sleeves and long pants are best.   Wear white clothes so you can see ticks more easily.  Tuck your pant legs into your socks.   If walking on a trail, stay in the middle of the trail to avoid brushing against bushes.  Avoid walking through areas with long grass.  Put insect repellent on all exposed skin and along boot tops, pant legs, and sleeve cuffs.   Check clothing, hair, and skin repeatedly and before going inside.   Brush off any ticks that are not attached.  Take a shower or bath as soon as possible after being outdoors.  WHAT IS THE PROPER WAY TO REMOVE A TICK? Ticks should be removed as soon as possible to help prevent diseases caused by tick bites. 1. If latex gloves are available, put them on before trying to remove a tick.  2. Using fine-point tweezers, grasp the tick as close to the skin as possible. You may also use curved forceps or a tick removal tool. Grasp the tick as close to its head as possible. Avoid grasping the tick on its body. 3. Pull gently with steady upward pressure until  the tick lets go. Do not twist the tick or jerk it suddenly. This may break off the tick's head or mouth parts. 4. Do not squeeze or crush the tick's body. This could force disease-carrying fluids from the tick into your body.  5. After the tick is removed, wash the bite area and your hands with soap and water or other disinfectant such as alcohol. 6. Apply a small amount of antiseptic cream or ointment to the bite site.  7. Wash and disinfect any instruments that were used.  Do not try to remove a tick by applying a hot match, petroleum jelly, or fingernail polish to the tick. These methods do not work and may increase the chances of disease being spread from the tick bite.  WHEN SHOULD YOU SEEK MEDICAL CARE? Contact your health care provider if you are unable to remove a tick from your skin or if a part of the tick breaks off and is stuck in the skin.  After a tick bite, you need to be aware of signs and symptoms that could be related to diseases spread by ticks. Contact your health care provider if you develop any of the following in the days or weeks after the tick bite:  Unexplained fever.  Rash. A circular rash that appears days or weeks after the tick bite may indicate the possibility of Lyme disease. The rash may resemble   a target with a bull's-eye and may occur at a different part of your body than the tick bite.  Redness and swelling in the area of the tick bite.   Tender, swollen lymph glands.   Diarrhea.   Weight loss.   Cough.   Fatigue.   Muscle, joint, or bone pain.   Abdominal pain.   Headache.   Lethargy or a change in your level of consciousness.  Difficulty walking or moving your legs.   Numbness in the legs.   Paralysis.  Shortness of breath.   Confusion.   Repeated vomiting.  Document Released: 11/18/2000 Document Revised: 09/11/2013 Document Reviewed: 05/01/2013 Exit[employer] Patient Information 2015 [employer]. This information is  not intended to replace advice given to you by your health care provider. Make sure you discuss any questions you have with your health care provider.  

## 2014-09-24 ENCOUNTER — Ambulatory Visit (INDEPENDENT_AMBULATORY_CARE_PROVIDER_SITE_OTHER): Payer: Medicare Other | Admitting: Family Medicine

## 2014-09-24 ENCOUNTER — Encounter: Payer: Self-pay | Admitting: Family Medicine

## 2014-09-24 VITALS — BP 102/63 | HR 55 | Temp 97.9°F | Ht 70.5 in | Wt 183.0 lb

## 2014-09-24 DIAGNOSIS — N400 Enlarged prostate without lower urinary tract symptoms: Secondary | ICD-10-CM

## 2014-09-24 DIAGNOSIS — E559 Vitamin D deficiency, unspecified: Secondary | ICD-10-CM

## 2014-09-24 DIAGNOSIS — J019 Acute sinusitis, unspecified: Secondary | ICD-10-CM

## 2014-09-24 DIAGNOSIS — D696 Thrombocytopenia, unspecified: Secondary | ICD-10-CM

## 2014-09-24 DIAGNOSIS — E785 Hyperlipidemia, unspecified: Secondary | ICD-10-CM

## 2014-09-24 DIAGNOSIS — M5136 Other intervertebral disc degeneration, lumbar region: Secondary | ICD-10-CM

## 2014-09-24 DIAGNOSIS — J029 Acute pharyngitis, unspecified: Secondary | ICD-10-CM

## 2014-09-24 LAB — POCT CBC
Granulocyte percent: 60 %G (ref 37–80)
HEMATOCRIT: 42.5 % — AB (ref 43.5–53.7)
HEMOGLOBIN: 14 g/dL — AB (ref 14.1–18.1)
Lymph, poc: 2 (ref 0.6–3.4)
MCH, POC: 29.6 pg (ref 27–31.2)
MCHC: 32.9 g/dL (ref 31.8–35.4)
MCV: 89.9 fL (ref 80–97)
MPV: 7.2 fL (ref 0–99.8)
POC Granulocyte: 3.2 (ref 2–6.9)
POC LYMPH PERCENT: 36.8 %L (ref 10–50)
Platelet Count, POC: 131 10*3/uL — AB (ref 142–424)
RBC: 4.7 M/uL (ref 4.69–6.13)
RDW, POC: 12.4 %
WBC: 5.4 10*3/uL (ref 4.6–10.2)

## 2014-09-24 LAB — POCT RAPID STREP A (OFFICE): Rapid Strep A Screen: NEGATIVE

## 2014-09-24 MED ORDER — AMOXICILLIN 500 MG PO CAPS: 500.0000 mg | ORAL_CAPSULE | Freq: Three times a day (TID) | ORAL | Status: DC

## 2014-09-24 NOTE — Progress Notes (Addendum)
Subjective:    Patient ID: Paul Parks, male    DOB: 08-13-1936, 78 y.o.   MRN: [id number]  HPI Pt here for follow up and management of chronic medical problems. In addition to his regular problems, the patient does complain of fever headache congestion malaise and cough. He is due to get his flu shot but will come back after the current illness is better. He will get lab work today. He sees the urologist yearly and plans to see him in November. The patient also complains of some left lower quadrant pain which has been ongoing off and on .      Patient Active Problem List   Diagnosis Date Noted  . Degenerative disc disease, lumbar 10/08/2013  . BPH (benign prostatic hypertrophy) 05/30/2013  . Labyrinthitis 05/30/2013  . Hyperlipemia 03/12/2013  . Personal history of colonic polyps - adenomas 04/24/2008   Outpatient Encounter Prescriptions as of 09/24/2014  Medication Sig  . aspirin 81 MG tablet Take 81 mg by mouth daily.  . cholecalciferol (VITAMIN D) 1000 UNITS tablet Take 2,000 Units by mouth daily.   Marland Kitchen dutasteride (AVODART) 0.5 MG capsule Take 0.5 mg by mouth daily. qod  . fluticasone (FLONASE) 50 MCG/ACT nasal spray INSTILL 1 SPRAY IN EACH NOSTRIL DAILY  . meclizine (ANTIVERT) 12.5 MG tablet TAKE 1 TABLET 4 TIMES A DAY WITH MEALS AND AT BEDTIME FOR DIZZINESS  . meloxicam (MOBIC) 15 MG tablet TAKE 1/2 TO 1 TABLET BY MOUTH EVERY DAY AFTER MEALS  . Multiple Vitamin (MULTIVITAMIN WITH MINERALS) TABS tablet Take 1 tablet by mouth daily.  . rosuvastatin (CRESTOR) 10 MG tablet Take 1 tablet (10 mg total) by mouth daily. 1/2 qd  . [DISCONTINUED] doxycycline (VIBRA-TABS) 100 MG tablet Take 1 tablet (100 mg total) by mouth 2 (two) times daily.    Review of Systems  Constitutional: Positive for fever (had, now better) and fatigue.  HENT: Positive for congestion, postnasal drip and sore throat.   Eyes: Negative.   Respiratory: Positive for cough (slight).   Cardiovascular: Negative.    Gastrointestinal: Negative.   Endocrine: Negative.   Genitourinary: Negative.   Musculoskeletal: Negative.   Skin: Negative.   Allergic/Immunologic: Negative.   Neurological: Negative.   Hematological: Negative.   Psychiatric/Behavioral: Negative.        Objective:   Physical Exam  Nursing note and vitals reviewed. Constitutional: He is oriented to person, place, and time. He appears well-developed and well-nourished. No distress.  HENT:  Head: Normocephalic and atraumatic.  Right Ear: External ear normal.  Left Ear: External ear normal.  Mouth/Throat: Oropharynx is clear and moist. No oropharyngeal exudate.  Turbinate swelling and edema and redness Nasal congestion bilaterally  Eyes: Conjunctivae and EOM are normal. Pupils are equal, round, and reactive to light. Right eye exhibits no discharge. Left eye exhibits no discharge. No scleral icterus.  Neck: Normal range of motion. Neck supple. No thyromegaly present.  Cardiovascular: Normal rate, regular rhythm and normal heart sounds.  Exam reveals no gallop and no friction rub.   No murmur heard. At 72 per minute  Pulmonary/Chest: Effort normal and breath sounds normal. No respiratory distress. He has no wheezes. He has no rales. He exhibits no tenderness.  Minimal congestion with coughing  Abdominal: Soft. Bowel sounds are normal. He exhibits no mass. There is no tenderness. There is no rebound and no guarding.  Genitourinary: Rectum normal and penis normal.  There is a left lower quadrant abdominal wall weakness but without bulging. There were  no inguinal hernias palpated. The testicles appear normal.  Musculoskeletal: Normal range of motion. He exhibits no edema.  Lymphadenopathy:    He has no cervical adenopathy.  Neurological: He is alert and oriented to person, place, and time. He has normal reflexes. No cranial nerve deficit.  Skin: Skin is warm and dry. No rash noted. No erythema. No pallor.  Psychiatric: He has a  normal mood and affect. His behavior is normal. Judgment and thought content normal.   BP 102/63  Pulse 55  Temp(Src) 97.9 F (36.6 C) (Oral)  Ht 5' 10.5" (1.791 m)  Wt 183 lb (83.008 kg)  BMI 25.88 kg/m2  Results for orders placed in visit on 09/24/14  POCT RAPID STREP A (OFFICE)      Result Value Ref Range   Rapid Strep A Screen Negative  Negative         Assessment & Plan:  1. Hyperlipemia - POCT CBC - BMP8+EGFR - Hepatic function panel - NMR, lipoprofile  2. BPH (benign prostatic hypertrophy) - POCT CBC  3. Sore throat - POCT rapid strep A - Strep A culture, throat - POCT CBC  4. Vitamin D deficiency - Vit D  25 hydroxy (rtn osteoporosis monitoring)  5. Acute rhinosinusitis - amoxicillin (AMOXIL) 500 MG capsule; Take 1 capsule (500 mg total) by mouth 3 (three) times daily.  Dispense: 30 capsule; Refill: 0  6. Degenerative disc disease, lumbar  7. Thrombocytopenia  Meds ordered this encounter  Medications  . amoxicillin (AMOXIL) 500 MG capsule    Sig: Take 1 capsule (500 mg total) by mouth 3 (three) times daily.    Dispense:  30 capsule    Refill:  0   Patient Instructions                       Medicare Annual Wellness Visit  [facility] and the medical providers at [facility] strive to bring you the best medical care.  In doing so we not only want to address your current medical conditions and concerns but also to detect new conditions early and prevent illness, disease and health-related problems.    Medicare offers a yearly Wellness Visit which allows our clinical staff to assess your need for preventative services including immunizations, lifestyle education, counseling to decrease risk of preventable diseases and screening for fall risk and other medical concerns.    This visit is provided free of charge (no copay) for all Medicare recipients. The clinical pharmacists at [facility] have begun to conduct  these Wellness Visits which will also include a thorough review of all your medications.    As you primary medical provider recommend that you make an appointment for your Annual Wellness Visit if you have not done so already this year.  You may set up this appointment before you leave today or you may call back ([phone]) and schedule an appointment.  Please make sure when you call that you mention that you are scheduling your Annual Wellness Visit with the clinical pharmacist so that the appointment may be made for the proper length of time.     Continue current medications. Continue good therapeutic lifestyle changes which include good diet and exercise. Fall precautions discussed with patient. If an FOBT was given today- please return it to our front desk. If you are over 50 years old - you may need Prevnar 13 or the adult Pneumonia vaccine.  Flu Shots will be available at our   office starting mid- September. Please call and schedule a FLU CLINIC APPOINTMENT.   Take antibiotics as directed Gargle with warm salty water Take Mucinex maximum strength, blue and white in color, over-the-counter, one twice daily for cough and congestion  Use saline nose spray frequently Drink plenty of fluids If problems with left lower quadrant abdominal pain continued call back and we will arrange to get a CT scan of your abdomen to see if there is an inguinal hernia or direct hernia present. When feeling better from this upper respiratory infection, please return to clinic and get your flu shot   Don W. Moore MD   

## 2014-09-24 NOTE — Patient Instructions (Addendum)
Medicare Annual Wellness Visit  [facility] and the medical providers at [facility] strive to bring you the best medical care.  In doing so we not only want to address your current medical conditions and concerns but also to detect new conditions early and prevent illness, disease and health-related problems.    Medicare offers a yearly Wellness Visit which allows our clinical staff to assess your need for preventative services including immunizations, lifestyle education, counseling to decrease risk of preventable diseases and screening for fall risk and other medical concerns.    This visit is provided free of charge (no copay) for all Medicare recipients. The clinical pharmacists at [facility] have begun to conduct these Wellness Visits which will also include a thorough review of all your medications.    As you primary medical provider recommend that you make an appointment for your Annual Wellness Visit if you have not done so already this year.  You may set up this appointment before you leave today or you may call back ([phone]) and schedule an appointment.  Please make sure when you call that you mention that you are scheduling your Annual Wellness Visit with the clinical pharmacist so that the appointment may be made for the proper length of time.     Continue current medications. Continue good therapeutic lifestyle changes which include good diet and exercise. Fall precautions discussed with patient. If an FOBT was given today- please return it to our front desk. If you are over 58 years old - you may need Prevnar 91 or the adult Pneumonia vaccine.  Flu Shots will be available at our office starting mid- September. Please call and schedule a FLU CLINIC APPOINTMENT.   Take antibiotics as directed Gargle with warm salty water Take Mucinex maximum strength, blue and white in color, over-the-counter, one twice daily for  cough and congestion  Use saline nose spray frequently Drink plenty of fluids If problems with left lower quadrant abdominal pain continued call back and we will arrange to get a CT scan of your abdomen to see if there is an inguinal hernia or direct hernia present. When feeling better from this upper respiratory infection, please return to clinic and get your flu shot

## 2014-09-25 LAB — BMP8+EGFR
BUN / CREAT RATIO: 10 (ref 10–22)
BUN: 12 mg/dL (ref 8–27)
CHLORIDE: 98 mmol/L (ref 97–108)
CO2: 28 mmol/L (ref 18–29)
Calcium: 9.2 mg/dL (ref 8.6–10.2)
Creatinine, Ser: 1.15 mg/dL (ref 0.76–1.27)
GFR, EST AFRICAN AMERICAN: 71 mL/min/{1.73_m2} (ref >59)
GFR, EST NON AFRICAN AMERICAN: 61 mL/min/{1.73_m2} (ref >59)
GLUCOSE: 92 mg/dL (ref 65–99)
Potassium: 4.4 mmol/L (ref 3.5–5.2)
Sodium: 139 mmol/L (ref 134–144)

## 2014-09-25 LAB — NMR, LIPOPROFILE
CHOLESTEROL: 157 mg/dL (ref 100–199)
HDL CHOLESTEROL BY NMR: 57 mg/dL (ref >39)
HDL Particle Number: 31.1 umol/L (ref >=30.5)
LDL PARTICLE NUMBER: 965 nmol/L (ref <1000)
LDL Size: 20.7 nm (ref >20.5)
LDLC SERPL CALC-MCNC: 82 mg/dL (ref 0–99)
LP-IR SCORE: 25 (ref <=45)
Small LDL Particle Number: 353 nmol/L (ref <=527)
Triglycerides by NMR: 90 mg/dL (ref 0–149)

## 2014-09-25 LAB — HEPATIC FUNCTION PANEL
ALK PHOS: 55 IU/L (ref 39–117)
ALT: 11 IU/L (ref 0–44)
AST: 16 IU/L (ref 0–40)
Albumin: 4.4 g/dL (ref 3.5–4.8)
Bilirubin, Direct: 0.13 mg/dL (ref 0.00–0.40)
TOTAL PROTEIN: 6.7 g/dL (ref 6.0–8.5)
Total Bilirubin: 0.6 mg/dL (ref 0.0–1.2)

## 2014-09-25 LAB — VITAMIN D 25 HYDROXY (VIT D DEFICIENCY, FRACTURES): Vit D, 25-Hydroxy: 36.9 ng/mL (ref 30.0–100.0)

## 2014-09-26 LAB — STREP A CULTURE, THROAT: STREP A CULTURE: NEGATIVE

## 2014-09-30 ENCOUNTER — Telehealth: Payer: Self-pay | Admitting: Family Medicine

## 2014-10-01 ENCOUNTER — Encounter: Payer: Self-pay | Admitting: Family Medicine

## 2014-10-16 ENCOUNTER — Ambulatory Visit: Payer: Medicare Other

## 2014-10-21 ENCOUNTER — Ambulatory Visit (INDEPENDENT_AMBULATORY_CARE_PROVIDER_SITE_OTHER): Payer: Medicare Other

## 2014-10-21 DIAGNOSIS — Z23 Encounter for immunization: Secondary | ICD-10-CM

## 2014-11-13 IMAGING — CR DG CHEST 2V
2 series · 2 of 2 positions shown · non-contrast
Comparison: Chest x-ray of 10/25/2010

CLINICAL DATA: Hyperlipidemia

EXAM:
CHEST  2 VIEW

[view not recorded (1 of 2)]
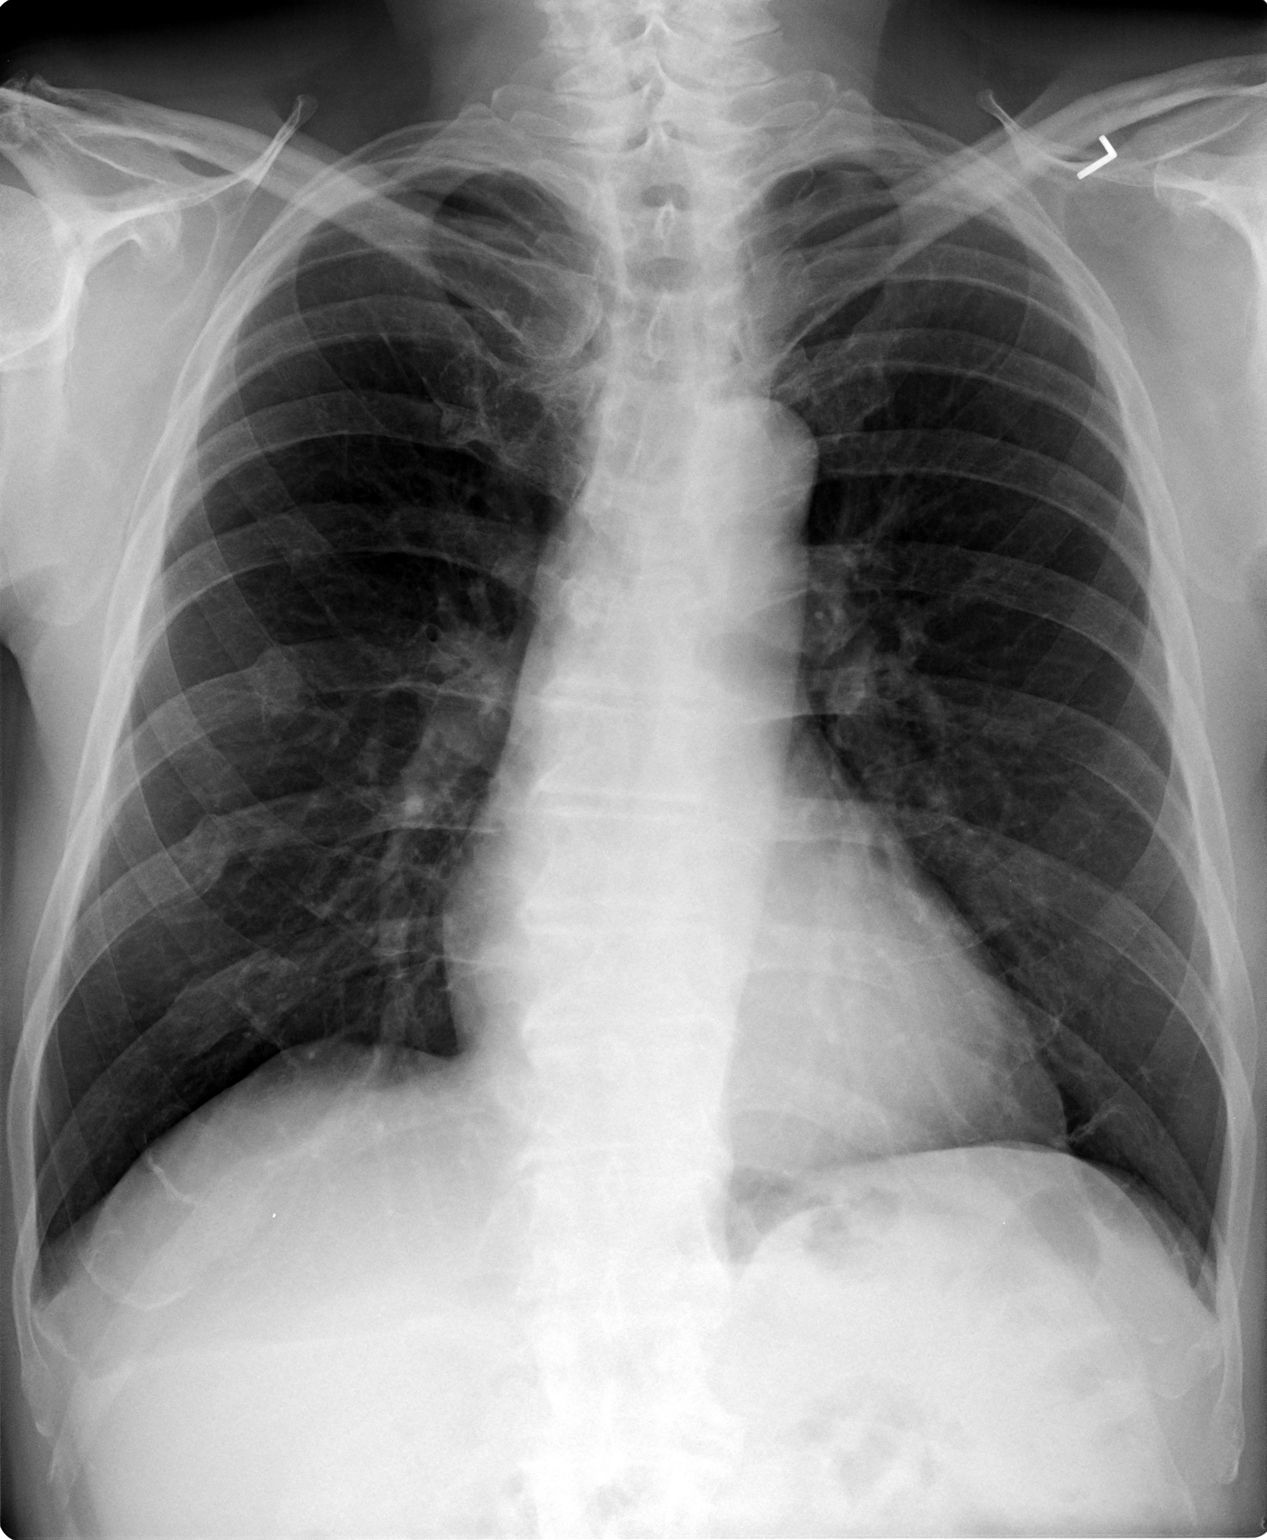

[view not recorded (2 of 2)]
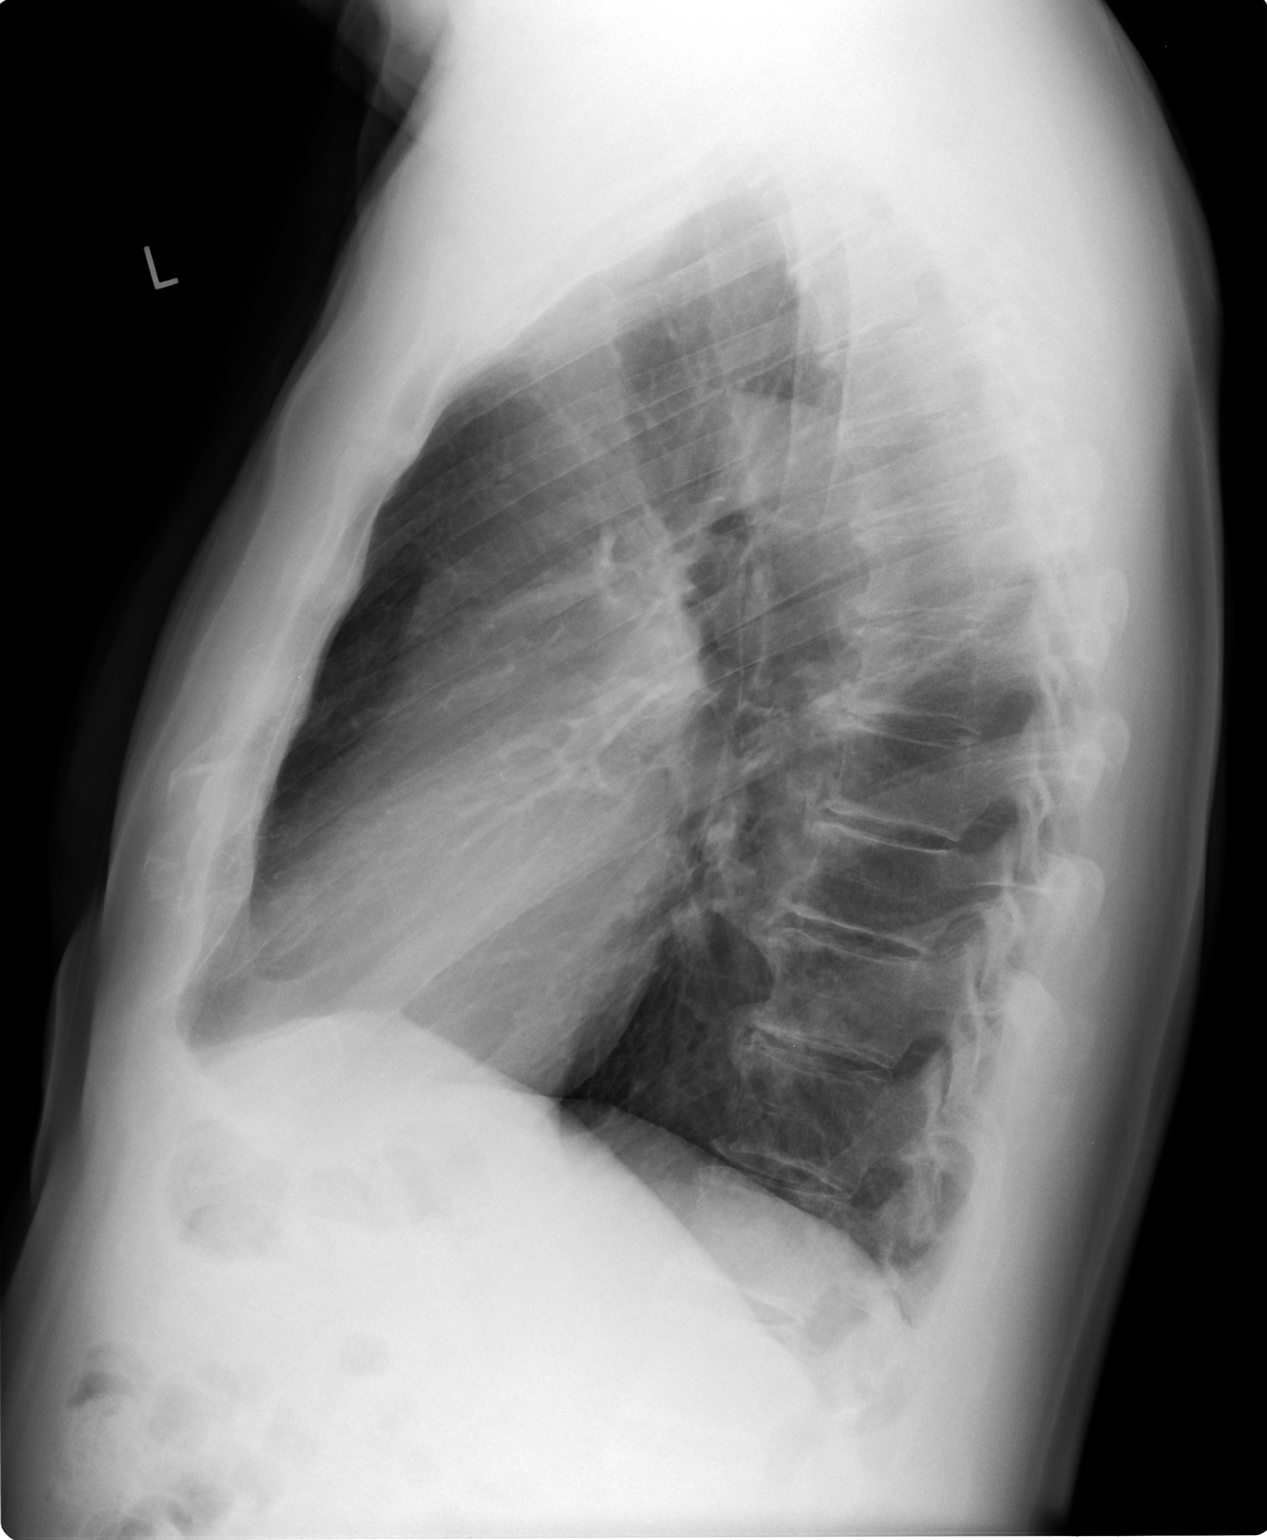

[2 of 2 positions shown; findings below may reference images not displayed]

FINDINGS: No active infiltrate or effusion is seen. Mediastinal contours
appear stable. The heart is within upper limits normal for age. Old
right rib fractures are noted with healing and there are
degenerative changes throughout the thoracic spine.
IMPRESSION: No active lung disease.

## 2014-11-17 ENCOUNTER — Other Ambulatory Visit: Payer: Self-pay | Admitting: Family Medicine

## 2014-12-30 ENCOUNTER — Encounter: Payer: Self-pay | Admitting: *Deleted

## 2015-02-03 DIAGNOSIS — H401123 Primary open-angle glaucoma, left eye, severe stage: Secondary | ICD-10-CM | POA: Insufficient documentation

## 2015-02-03 DIAGNOSIS — H401112 Primary open-angle glaucoma, right eye, moderate stage: Secondary | ICD-10-CM | POA: Insufficient documentation

## 2015-02-10 ENCOUNTER — Ambulatory Visit (INDEPENDENT_AMBULATORY_CARE_PROVIDER_SITE_OTHER): Payer: Medicare Other | Admitting: Family Medicine

## 2015-02-10 ENCOUNTER — Encounter: Payer: Self-pay | Admitting: Family Medicine

## 2015-02-10 VITALS — BP 118/71 | HR 63 | Temp 97.7°F | Ht 70.5 in | Wt 190.0 lb

## 2015-02-10 DIAGNOSIS — N400 Enlarged prostate without lower urinary tract symptoms: Secondary | ICD-10-CM | POA: Diagnosis not present

## 2015-02-10 DIAGNOSIS — D696 Thrombocytopenia, unspecified: Secondary | ICD-10-CM | POA: Diagnosis not present

## 2015-02-10 DIAGNOSIS — E785 Hyperlipidemia, unspecified: Secondary | ICD-10-CM

## 2015-02-10 DIAGNOSIS — R5383 Other fatigue: Secondary | ICD-10-CM | POA: Diagnosis not present

## 2015-02-10 DIAGNOSIS — R42 Dizziness and giddiness: Secondary | ICD-10-CM | POA: Diagnosis not present

## 2015-02-10 DIAGNOSIS — E559 Vitamin D deficiency, unspecified: Secondary | ICD-10-CM | POA: Diagnosis not present

## 2015-02-10 LAB — POCT CBC
Granulocyte percent: 52.7 %G (ref 37–80)
HCT, POC: 43.2 % — AB (ref 43.5–53.7)
Hemoglobin: 14.2 g/dL (ref 14.1–18.1)
LYMPH, POC: 2.3 (ref 0.6–3.4)
MCH, POC: 29.9 pg (ref 27–31.2)
MCHC: 32.9 g/dL (ref 31.8–35.4)
MCV: 91 fL (ref 80–97)
MPV: 6.9 fL (ref 0–99.8)
POC GRANULOCYTE: 3 (ref 2–6.9)
POC LYMPH %: 40.7 % (ref 10–50)
Platelet Count, POC: 139 10*3/uL — AB (ref 142–424)
RBC: 4.75 M/uL (ref 4.69–6.13)
RDW, POC: 12.6 %
WBC: 5.7 10*3/uL (ref 4.6–10.2)

## 2015-02-10 MED ORDER — MELOXICAM 15 MG PO TABS: ORAL_TABLET | ORAL | Status: DC

## 2015-02-10 NOTE — Progress Notes (Signed)
Subjective:    Patient ID: Paul Parks, male    DOB: 06/22/1936, 79 y.o.   MRN: [id number]  HPI Pt here for follow up and management of chronic medical problems which includes hyperlipidemia. He is taking medications regularly. The patient is doing well. He has no complaints with chest pain shortness of breath GI symptoms or voiding symptoms. He is still taking Avodart.        Patient Active Problem List   Diagnosis Date Noted  . Thrombocytopenia 09/24/2014  . Degenerative disc disease, lumbar 10/08/2013  . BPH (benign prostatic hypertrophy) 05/30/2013  . Labyrinthitis 05/30/2013  . Hyperlipemia 03/12/2013  . Personal history of colonic polyps - adenomas 04/24/2008   Outpatient Encounter Prescriptions as of 02/10/2015  Medication Sig  . aspirin 81 MG tablet Take 81 mg by mouth daily.  . cholecalciferol (VITAMIN D) 1000 UNITS tablet Take 2,000 Units by mouth daily.   Marland Kitchen dutasteride (AVODART) 0.5 MG capsule Take 0.5 mg by mouth daily. qod  . fluticasone (FLONASE) 50 MCG/ACT nasal spray INSTILL 1 SPRAY IN EACH NOSTRIL DAILY  . meclizine (ANTIVERT) 12.5 MG tablet TAKE 1 TABLET 4 TIMES A DAY WITH MEALS AND AT BEDTIME FOR DIZZINESS  . meloxicam (MOBIC) 15 MG tablet TAKE 1/2 TO 1 TABLET BY MOUTH EVERY DAY AFTER MEALS  . Multiple Vitamin (MULTIVITAMIN WITH MINERALS) TABS tablet Take 1 tablet by mouth daily.  . rosuvastatin (CRESTOR) 10 MG tablet Take 1 tablet (10 mg total) by mouth daily. 1/2 qd  . TRAVATAN Z 0.004 % SOLN ophthalmic solution   . [DISCONTINUED] amoxicillin (AMOXIL) 500 MG capsule Take 1 capsule (500 mg total) by mouth 3 (three) times daily.    Review of Systems  Constitutional: Positive for fatigue.  HENT: Negative.   Eyes: Negative.   Respiratory: Negative.   Cardiovascular: Negative.   Gastrointestinal: Negative.   Endocrine: Negative.   Genitourinary: Negative.   Musculoskeletal: Negative.   Skin: Negative.   Allergic/Immunologic: Negative.   Neurological:  Negative.   Hematological: Negative.   Psychiatric/Behavioral: Negative.        Objective:   Physical Exam  Constitutional: He is oriented to person, place, and time. He appears well-developed and well-nourished.  HENT:  Head: Normocephalic and atraumatic.  Right Ear: External ear normal.  Left Ear: External ear normal.  Nose: Nose normal.  Mouth/Throat: Oropharynx is clear and moist. No oropharyngeal exudate.  Eyes: Conjunctivae and EOM are normal. Pupils are equal, round, and reactive to light. Right eye exhibits no discharge. Left eye exhibits no discharge. No scleral icterus.  Neck: Normal range of motion. Neck supple. No thyromegaly present.  The neck is without bruits or thyromegaly  Cardiovascular: Normal rate, regular rhythm, normal heart sounds and intact distal pulses.   No murmur heard. At 72/m  Pulmonary/Chest: Effort normal and breath sounds normal. No respiratory distress. He has no wheezes. He has no rales. He exhibits no tenderness.  Abdominal: Soft. Bowel sounds are normal. He exhibits no mass. There is tenderness. There is no rebound and no guarding.  There is slight epigastric tenderness without masses.  Musculoskeletal: Normal range of motion. He exhibits no edema or tenderness.  Lymphadenopathy:    He has no cervical adenopathy.  Neurological: He is alert and oriented to person, place, and time. He has normal reflexes. No cranial nerve deficit.  Skin: Skin is warm and dry. No rash noted. No erythema. No pallor.  Psychiatric: He has a normal mood and affect. His behavior is normal. Judgment  and thought content normal.  Nursing note and vitals reviewed.  BP 118/71 mmHg  Pulse 63  Temp(Src) 97.7 F (36.5 C) (Oral)  Ht 5' 10.5" (1.791 m)  Wt 190 lb (86.183 kg)  BMI 26.87 kg/m2        Assessment & Plan:  1. Hyperlipemia -The patient should continue with current treatment and with as aggressive lifestyle changes as possible. - POCT CBC - BMP8+EGFR -  Hepatic function panel - NMR, lipoprofile  2. BPH (benign prostatic hypertrophy) -Hold the Avodart as directed to see if this is playing a role with his weakness and dizziness -Also rechecking a testosterone level to see if this can be playing a role with his weakness and fatigue - POCT CBC  3. Vitamin D deficiency -Continue current treatment pending results of lab work - POCT CBC - Vit D  25 hydroxy (rtn osteoporosis monitoring)  4. Thrombocytopenia -The platelet count remains stable and fact is improved slightly from the previous reading. -He is having no bleeding issues related to this. - POCT CBC  5. Other fatigue -We will consider in the future getting an MRI of the lumbar spine thinking about the possibility of spinal stenosis playing a role with his weakness in his legs. - POCT CBC - BMP8+EGFR - Hepatic function panel - Vit D  25 hydroxy (rtn osteoporosis monitoring) - Thyroid Panel With TSH - Testosterone,Free and Total  6. Dizziness and giddiness -Record blood pressure readings as directed, use nasal saline as directed, wear support nose as directed and hold the Avodart for a couple weeks.  Patient Instructions                       Medicare Annual Wellness Visit  [facility] and the medical providers at [facility] strive to bring you the best medical care.  In doing so we not only want to address your current medical conditions and concerns but also to detect new conditions early and prevent illness, disease and health-related problems.    Medicare offers a yearly Wellness Visit which allows our clinical staff to assess your need for preventative services including immunizations, lifestyle education, counseling to decrease risk of preventable diseases and screening for fall risk and other medical concerns.    This visit is provided free of charge (no copay) for all Medicare recipients. The clinical pharmacists at [facility] have begun to conduct these Wellness Visits which will also include a thorough review of all your medications.    As you primary medical provider recommend that you make an appointment for your Annual Wellness Visit if you have not done so already this year.  You may set up this appointment before you leave today or you may call back ([phone]) and schedule an appointment.  Please make sure when you call that you mention that you are scheduling your Annual Wellness Visit with the clinical pharmacist so that the appointment may be made for the proper length of time.     Continue current medications. Continue good therapeutic lifestyle changes which include good diet and exercise. Fall precautions discussed with patient. If an FOBT was given today- please return it to our front desk. If you are over 49 years old - you may need Prevnar 101 or the adult Pneumonia vaccine.  Flu Shots are still available at our office. If you still haven't had one please call to set up a nurse visit to get one.   After  your visit with [name] today you will receive a survey in the mail or online from [employer] regarding your care with [name]. Please take a moment to fill this out. Your feedback is very important to [name] as you can help [name] better understand your patient needs as well as improve your experience and satisfaction. WE CARE ABOUT YOU!!!   The patient will leave off the Avodart for a couple of weeks and see if this makes his dizziness better He will keep a blood pressure diary with blood pressure readings when he is feeling dizzy and even when he is not feeling dizzy He will try a pair of mild support hose that he will put on the first thing in the morning He should continue to use nasal saline frequently during the day in each nostril We will repeat the testosterone levels that have been low in the past and if they remain low and this time we will consider talking with Dr. Jeffie Pollock because of the patient's  decreased energy and increased fatigue We will consider in the future the possibility of getting an MRI of his back with the concern that maybe he could have spinal stenosis.   Arrie Senate MD

## 2015-02-10 NOTE — Patient Instructions (Addendum)
Medicare Annual Wellness Visit  [facility] and the medical providers at [facility] strive to bring you the best medical care.  In doing so we not only want to address your current medical conditions and concerns but also to detect new conditions early and prevent illness, disease and health-related problems.    Medicare offers a yearly Wellness Visit which allows our clinical staff to assess your need for preventative services including immunizations, lifestyle education, counseling to decrease risk of preventable diseases and screening for fall risk and other medical concerns.    This visit is provided free of charge (no copay) for all Medicare recipients. The clinical pharmacists at [facility] have begun to conduct these Wellness Visits which will also include a thorough review of all your medications.    As you primary medical provider recommend that you make an appointment for your Annual Wellness Visit if you have not done so already this year.  You may set up this appointment before you leave today or you may call back ([phone]) and schedule an appointment.  Please make sure when you call that you mention that you are scheduling your Annual Wellness Visit with the clinical pharmacist so that the appointment may be made for the proper length of time.     Continue current medications. Continue good therapeutic lifestyle changes which include good diet and exercise. Fall precautions discussed with patient. If an FOBT was given today- please return it to our front desk. If you are over 62 years old - you may need Prevnar 59 or the adult Pneumonia vaccine.  Flu Shots are still available at our office. If you still haven't had one please call to set up a nurse visit to get one.   After your visit with [name] today you will receive a survey in the mail or online from [employer] regarding your care with [name]. Please take a moment to  fill this out. Your feedback is very important to [name] as you can help [name] better understand your patient needs as well as improve your experience and satisfaction. WE CARE ABOUT YOU!!!   The patient will leave off the Avodart for a couple of weeks and see if this makes his dizziness better He will keep a blood pressure diary with blood pressure readings when he is feeling dizzy and even when he is not feeling dizzy He will try a pair of mild support hose that he will put on the first thing in the morning He should continue to use nasal saline frequently during the day in each nostril We will repeat the testosterone levels that have been low in the past and if they remain low and this time we will consider talking with Dr. Jeffie Pollock because of the patient's decreased energy and increased fatigue We will consider in the future the possibility of getting an MRI of his back with the concern that maybe he could have spinal stenosis.

## 2015-02-11 ENCOUNTER — Ambulatory Visit: Payer: Medicare Other | Admitting: Family Medicine

## 2015-02-17 ENCOUNTER — Ambulatory Visit: Payer: Medicare Other | Admitting: *Deleted

## 2015-02-18 ENCOUNTER — Encounter: Payer: Self-pay | Admitting: *Deleted

## 2015-02-18 ENCOUNTER — Ambulatory Visit (INDEPENDENT_AMBULATORY_CARE_PROVIDER_SITE_OTHER): Payer: Medicare Other | Admitting: *Deleted

## 2015-02-18 VITALS — BP 118/68 | HR 66 | Ht 69.0 in | Wt 191.0 lb

## 2015-02-18 DIAGNOSIS — Z0000 Encounter for general adult medical examination without abnormal findings: Secondary | ICD-10-CM

## 2015-02-18 DIAGNOSIS — H409 Unspecified glaucoma: Secondary | ICD-10-CM | POA: Insufficient documentation

## 2015-02-18 LAB — TESTOSTERONE,FREE AND TOTAL
TESTOSTERONE: 553 ng/dL (ref 348–1197)
Testosterone, Free: 9 pg/mL (ref 6.6–18.1)

## 2015-02-18 LAB — BMP8+EGFR
BUN/Creatinine Ratio: 15 (ref 10–22)
BUN: 16 mg/dL (ref 8–27)
CHLORIDE: 100 mmol/L (ref 97–108)
CO2: 27 mmol/L (ref 18–29)
Calcium: 9.2 mg/dL (ref 8.6–10.2)
Creatinine, Ser: 1.05 mg/dL (ref 0.76–1.27)
GFR calc Af Amer: 79 mL/min/{1.73_m2} (ref >59)
GFR calc non Af Amer: 69 mL/min/{1.73_m2} (ref >59)
GLUCOSE: 89 mg/dL (ref 65–99)
POTASSIUM: 4.9 mmol/L (ref 3.5–5.2)
SODIUM: 141 mmol/L (ref 134–144)

## 2015-02-18 LAB — THYROID PANEL WITH TSH
FREE THYROXINE INDEX: 1.6 (ref 1.2–4.9)
T3 Uptake Ratio: 29 % (ref 24–39)
T4 TOTAL: 5.4 ug/dL (ref 4.5–12.0)
TSH: 2.28 u[IU]/mL (ref 0.450–4.500)

## 2015-02-18 LAB — HEPATIC FUNCTION PANEL
ALBUMIN: 4.4 g/dL (ref 3.5–4.8)
ALT: 18 IU/L (ref 0–44)
AST: 22 IU/L (ref 0–40)
Alkaline Phosphatase: 50 IU/L (ref 39–117)
BILIRUBIN TOTAL: 0.7 mg/dL (ref 0.0–1.2)
Bilirubin, Direct: 0.18 mg/dL (ref 0.00–0.40)
Total Protein: 6.6 g/dL (ref 6.0–8.5)

## 2015-02-18 LAB — NMR, LIPOPROFILE
Cholesterol: 158 mg/dL (ref 100–199)
HDL Cholesterol by NMR: 60 mg/dL (ref >39)
HDL PARTICLE NUMBER: 30.4 umol/L — AB (ref >=30.5)
LDL Particle Number: 1091 nmol/L — ABNORMAL HIGH (ref <1000)
LDL Size: 20.8 nm (ref >20.5)
LDL-C: 81 mg/dL (ref 0–99)
SMALL LDL PARTICLE NUMBER: 289 nmol/L (ref <=527)
Triglycerides by NMR: 84 mg/dL (ref 0–149)

## 2015-02-18 LAB — VITAMIN D 25 HYDROXY (VIT D DEFICIENCY, FRACTURES): Vit D, 25-Hydroxy: 39.7 ng/mL (ref 30.0–100.0)

## 2015-02-18 NOTE — Progress Notes (Signed)
Subjective:   Paul Parks is a 79 y.o. male who presents for an Initial Medicare Annual Wellness Visit. He has no complaints today, and is feeling well.   Cardiac Risk Factors include: advanced age (>5men, >62 women);dyslipidemia;male gender    Objective:    Today's Vitals   02/18/15 0933  BP: 118/68  Pulse: 66  Height: 5\' 9"  (1.753 m)  Weight: 191 lb (86.637 kg)  PainSc: 0-No pain    Current Medications (verified) Outpatient Encounter Prescriptions as of 02/18/2015  Medication Sig  . aspirin 81 MG tablet Take 81 mg by mouth daily.  . cholecalciferol (VITAMIN D) 1000 UNITS tablet Take 2,000 Units by mouth daily.   . fluticasone (FLONASE) 50 MCG/ACT nasal spray INSTILL 1 SPRAY IN EACH NOSTRIL DAILY (Patient taking differently: INSTILL 1 SPRAY IN EACH NOSTRIL DAILY prn)  . meclizine (ANTIVERT) 12.5 MG tablet TAKE 1 TABLET 4 TIMES A DAY WITH MEALS AND AT BEDTIME FOR DIZZINESS  . meloxicam (MOBIC) 15 MG tablet TAKE 1/2 TO 1 TABLET BY MOUTH EVERY DAY AFTER MEALS (Patient taking differently: TAKE 1/2 TO 1 TABLET BY MOUTH EVERY DAY AFTER MEALS PRN)  . Multiple Vitamin (MULTIVITAMIN WITH MINERALS) TABS tablet Take 1 tablet by mouth daily.  . rosuvastatin (CRESTOR) 10 MG tablet Take 1 tablet (10 mg total) by mouth daily. 1/2 qd  . TRAVATAN Z 0.004 % SOLN ophthalmic solution   . dutasteride (AVODART) 0.5 MG capsule Take 0.5 mg by mouth daily. qod    Allergies (verified) Sulfa antibiotics; Proscar; and Vesicare   History: Past Medical History  Diagnosis Date  . DDD (degenerative disc disease)   . BPH (benign prostatic hyperplasia)   . Hyperlipidemia   . Vitamin D deficiency   . Personal history of colonic polyps    Past Surgical History  Procedure Laterality Date  . Treatment fistula anal    . Colon surgery      polyps   . Colonoscopy     Family History  Problem Relation Age of Onset  . Heart disease Mother   . Heart disease Brother   . Colon cancer Neg Hx   .  Heart disease Father   . Lumbar disc disease Sister       Activities of Daily Living In your present state of health, do you have any difficulty performing the following activities: 02/18/2015  Is the patient deaf or have difficulty hearing? N  Hearing N  Vision Y  Difficulty concentrating or making decisions N  Walking or climbing stairs? N  Doing errands, shopping? N  Preparing Food and eating ? N  Using the Toilet? N  In the past six months, have you accidently leaked urine? Y  Do you have problems with loss of bowel control? N  Managing your Medications? N  Managing your Finances? N  Housekeeping or managing your Housekeeping? N    Immunizations and Health Maintenance Immunization History  Administered Date(s) Administered  . Influenza,inj,Quad PF,36+ Mos 10/08/2013, 10/21/2014  . Pneumococcal Conjugate-13 02/12/2014  . Pneumococcal Polysaccharide-23 03/19/2009  . Tdap 11/08/2011  . Zoster 01/22/2007   Health Maintenance Due  Topic Date Due  . PNA vac Low Risk Adult (2 of 2 - PPSV23) 02/13/2015    Patient Care Team: Chipper Herb, MD as PCP - General (Family Medicine) Rondel Oh, MD as Referring Physician (Ophthalmology) Irine Seal, MD as Attending Physician (Urology)     Assessment:   This is a routine wellness examination for Paul Parks.  Hearing/Vision screen Vision Screening Comments: Up to date- Saw Dr. Edilia Bo in [place], [place] opthamology 01/2015 will call to obtain records.   Dietary issues and exercise activities discussed: Current Exercise Habits:: Home exercise routine, Type of exercise: treadmill;strength training/weights, Time (Minutes): 60, Frequency (Times/Week): 6, Weekly Exercise (Minutes/Week): 360, Intensity: Moderate   Depression Screen PHQ 2/9 Scores 02/18/2015 02/10/2015 05/19/2014  PHQ - 2 Score 0 0 0    Fall Risk Fall Risk  02/18/2015 02/10/2015 05/19/2014 05/30/2013  Falls in the past year? No No No Yes  Number falls in past yr: - - -  2 or more  Risk for fall due to : - - - Other (Comment)    Cognitive Function: MMSE - Mini Mental State Exam 02/18/2015  Orientation to time 5  Orientation to Place 5  Registration 3  Attention/ Calculation 3  Recall 3  Language- name 2 objects 2  Language- repeat 1  Language- follow 3 step command 3  Language- read & follow direction 1  Write a sentence 1  Copy design 1  Total score 28    Screening Tests Health Maintenance  Topic Date Due  . PNA vac Low Risk Adult (2 of 2 - PPSV23) 02/13/2015  . INFLUENZA VACCINE  07/06/2015  . COLONOSCOPY  02/07/2019  . TETANUS/TDAP  11/04/2021  . ZOSTAVAX  Completed        Plan:     During the course of the visit Paul Parks was educated and counseled about the following appropriate screening and preventive services:   Vaccines to include Pneumoccal, Influenza, Hepatitis B, Td, Zostavax, HCV- patient is up to date on all necessary vaccines  Colorectal cancer screening  Glaucoma screening- saw Dr. Edilia Bo 01/08/2015- have obtained records from that visit.  Nutrition counseling  Prostate cancer screening    Patient Instructions (the written plan) were given to the patient.   Aurianna Earlywine M, RN   02/18/2015   I have reviewed and agree with the above AWV documentation.  Claretta Fraise, M.D.

## 2015-02-18 NOTE — Patient Instructions (Signed)
Information about advanced directives  - www.caringinfo.org  Preventive Care for Adults A healthy lifestyle and preventive care can promote health and wellness. Preventive health guidelines for men include the following key practices:  A routine yearly physical is a good way to check with your health care provider about your health and preventative screening. It is a chance to share any concerns and updates on your health and to receive a thorough exam.  Visit your dentist for a routine exam and preventative care every 6 months. Brush your teeth twice a day and floss once a day. Good oral hygiene prevents tooth decay and gum disease.  The frequency of eye exams is based on your age, health, family medical history, use of contact lenses, and other factors. Follow your health care provider's recommendations for frequency of eye exams.  Eat a healthy diet. Foods such as vegetables, fruits, whole grains, low-fat dairy products, and lean protein foods contain the nutrients you need without too many calories. Decrease your intake of foods high in solid fats, added sugars, and salt. Eat the right amount of calories for you.Get information about a proper diet from your health care provider, if necessary.  Regular physical exercise is one of the most important things you can do for your health. Most adults should get at least 150 minutes of moderate-intensity exercise (any activity that increases your heart rate and causes you to sweat) each week. In addition, most adults need muscle-strengthening exercises on 2 or more days a week.  Maintain a healthy weight. The body mass index (BMI) is a screening tool to identify possible weight problems. It provides an estimate of body fat based on height and weight. Your health care provider can find your BMI and can help you achieve or maintain a healthy weight.For adults 20 years and older:  A BMI below 18.5 is considered underweight.  A BMI of 18.5 to 24.9 is  normal.  A BMI of 25 to 29.9 is considered overweight.  A BMI of 30 and above is considered obese.  Maintain normal blood lipids and cholesterol levels by exercising and minimizing your intake of saturated fat. Eat a balanced diet with plenty of fruit and vegetables. Blood tests for lipids and cholesterol should begin at age 52 and be repeated every 5 years. If your lipid or cholesterol levels are high, you are over 50, or you are at high risk for heart disease, you may need your cholesterol levels checked more frequently.Ongoing high lipid and cholesterol levels should be treated with medicines if diet and exercise are not working.  If you smoke, find out from your health care provider how to quit. If you do not use tobacco, do not start.  Lung cancer screening is recommended for adults aged 61-80 years who are at high risk for developing lung cancer because of a history of smoking. A yearly low-dose CT scan of the lungs is recommended for people who have at least a 30-pack-year history of smoking and are a current smoker or have quit within the past 15 years. A pack year of smoking is smoking an average of 1 pack of cigarettes a day for 1 year (for example: 1 pack a day for 30 years or 2 packs a day for 15 years). Yearly screening should continue until the smoker has stopped smoking for at least 15 years. Yearly screening should be stopped for people who develop a health problem that would prevent them from having lung cancer treatment.  If you choose  to drink alcohol, do not have more than 2 drinks per day. One drink is considered to be 12 ounces (355 mL) of beer, 5 ounces (148 mL) of wine, or 1.5 ounces (44 mL) of liquor.  Avoid use of street drugs. Do not share needles with anyone. Ask for help if you need support or instructions about stopping the use of drugs.  High blood pressure causes heart disease and increases the risk of stroke. Your blood pressure should be checked at least every 1-2  years. Ongoing high blood pressure should be treated with medicines, if weight loss and exercise are not effective.  If you are 71-74 years old, ask your health care provider if you should take aspirin to prevent heart disease.  Diabetes screening involves taking a blood sample to check your fasting blood sugar level. This should be done once every 3 years, after age 67, if you are within normal weight and without risk factors for diabetes. Testing should be considered at a younger age or be carried out more frequently if you are overweight and have at least 1 risk factor for diabetes.  Colorectal cancer can be detected and often prevented. Most routine colorectal cancer screening begins at the age of 86 and continues through age 42. However, your health care provider may recommend screening at an earlier age if you have risk factors for colon cancer. On a yearly basis, your health care provider may provide home test kits to check for hidden blood in the stool. Use of a small camera at the end of a tube to directly examine the colon (sigmoidoscopy or colonoscopy) can detect the earliest forms of colorectal cancer. Talk to your health care provider about this at age 18, when routine screening begins. Direct exam of the colon should be repeated every 5-10 years through age 62, unless early forms of precancerous polyps or small growths are found.  People who are at an increased risk for hepatitis B should be screened for this virus. You are considered at high risk for hepatitis B if:  You were born in a country where hepatitis B occurs often. Talk with your health care provider about which countries are considered high risk.  Your parents were born in a high-risk country and you have not received a shot to protect against hepatitis B (hepatitis B vaccine).  You have HIV or AIDS.  You use needles to inject street drugs.  You live with, or have sex with, someone who has hepatitis B.  You are a man who  has sex with other men (MSM).  You get hemodialysis treatment.  You take certain medicines for conditions such as cancer, organ transplantation, and autoimmune conditions.  Hepatitis C blood testing is recommended for all people born from [birth year] through 1965 and any individual with known risks for hepatitis C.  Practice safe sex. Use condoms and avoid high-risk sexual practices to reduce the spread of sexually transmitted infections (STIs). STIs include gonorrhea, chlamydia, syphilis, trichomonas, herpes, HPV, and human immunodeficiency virus (HIV). Herpes, HIV, and HPV are viral illnesses that have no cure. They can result in disability, cancer, and death.  If you are at risk of being infected with HIV, it is recommended that you take a prescription medicine daily to prevent HIV infection. This is called preexposure prophylaxis (PrEP). You are considered at risk if:  You are a man who has sex with other men (MSM) and have other risk factors.  You are a heterosexual man, are sexually active,  and are at increased risk for HIV infection.  You take drugs by injection.  You are sexually active with a partner who has HIV.  Talk with your health care provider about whether you are at high risk of being infected with HIV. If you choose to begin PrEP, you should first be tested for HIV. You should then be tested every 3 months for as long as you are taking PrEP.  A one-time screening for abdominal aortic aneurysm (AAA) and surgical repair of large AAAs by ultrasound are recommended for men ages 87 to 67 years who are current or former smokers.  Healthy men should no longer receive prostate-specific antigen (PSA) blood tests as part of routine cancer screening. Talk with your health care provider about prostate cancer screening.  Testicular cancer screening is not recommended for adult males who have no symptoms. Screening includes self-exam, a health care provider exam, and other screening tests.  Consult with your health care provider about any symptoms you have or any concerns you have about testicular cancer.  Use sunscreen. Apply sunscreen liberally and repeatedly throughout the day. You should seek shade when your shadow is shorter than you. Protect yourself by wearing long sleeves, pants, a wide-brimmed hat, and sunglasses year round, whenever you are outdoors.  Once a month, do a whole-body skin exam, using a mirror to look at the skin on your back. Tell your health care provider about new moles, moles that have irregular borders, moles that are larger than a pencil eraser, or moles that have changed in shape or color.  Stay current with required vaccines (immunizations).  Influenza vaccine. All adults should be immunized every year.  Tetanus, diphtheria, and acellular pertussis (Td, Tdap) vaccine. An adult who has not previously received Tdap or who does not know his vaccine status should receive 1 dose of Tdap. This initial dose should be followed by tetanus and diphtheria toxoids (Td) booster doses every 10 years. Adults with an unknown or incomplete history of completing a 3-dose immunization series with Td-containing vaccines should begin or complete a primary immunization series including a Tdap dose. Adults should receive a Td booster every 10 years.  Varicella vaccine. An adult without evidence of immunity to varicella should receive 2 doses or a second dose if he has previously received 1 dose.  Human papillomavirus (HPV) vaccine. Males aged 26-21 years who have not received the vaccine previously should receive the 3-dose series. Males aged 22-26 years may be immunized. Immunization is recommended through the age of 25 years for any male who has sex with males and did not get any or all doses earlier. Immunization is recommended for any person with an immunocompromised condition through the age of 71 years if he did not get any or all doses earlier. During the 3-dose series, the  second dose should be obtained 4-8 weeks after the first dose. The third dose should be obtained 24 weeks after the first dose and 16 weeks after the second dose.  Zoster vaccine. One dose is recommended for adults aged 35 years or older unless certain conditions are present.  Measles, mumps, and rubella (MMR) vaccine. Adults born before [birth year] generally are considered immune to measles and mumps. Adults born in [birth year] or later should have 1 or more doses of MMR vaccine unless there is a contraindication to the vaccine or there is laboratory evidence of immunity to each of the three diseases. A routine second dose of MMR vaccine should be obtained at least 28  days after the first dose for students attending postsecondary schools, health care workers, or international travelers. People who received inactivated measles vaccine or an unknown type of measles vaccine during 1963-1967 should receive 2 doses of MMR vaccine. People who received inactivated mumps vaccine or an unknown type of mumps vaccine before 1979 and are at high risk for mumps infection should consider immunization with 2 doses of MMR vaccine. Unvaccinated health care workers born before 1 who lack laboratory evidence of measles, mumps, or rubella immunity or laboratory confirmation of disease should consider measles and mumps immunization with 2 doses of MMR vaccine or rubella immunization with 1 dose of MMR vaccine.  Pneumococcal 13-valent conjugate (PCV13) vaccine. When indicated, a person who is uncertain of his immunization history and has no record of immunization should receive the PCV13 vaccine. An adult aged 30 years or older who has certain medical conditions and has not been previously immunized should receive 1 dose of PCV13 vaccine. This PCV13 should be followed with a dose of pneumococcal polysaccharide (PPSV23) vaccine. The PPSV23 vaccine dose should be obtained at least 8 weeks after the dose of PCV13 vaccine. An adult aged 12 years  or older who has certain medical conditions and previously received 1 or more doses of PPSV23 vaccine should receive 1 dose of PCV13. The PCV13 vaccine dose should be obtained 1 or more years after the last PPSV23 vaccine dose.  Pneumococcal polysaccharide (PPSV23) vaccine. When PCV13 is also indicated, PCV13 should be obtained first. All adults aged 49 years and older should be immunized. An adult younger than age 40 years who has certain medical conditions should be immunized. Any person who resides in a nursing home or long-term care facility should be immunized. An adult smoker should be immunized. People with an immunocompromised condition and certain other conditions should receive both PCV13 and PPSV23 vaccines. People with human immunodeficiency virus (HIV) infection should be immunized as soon as possible after diagnosis. Immunization during chemotherapy or radiation therapy should be avoided. Routine use of PPSV23 vaccine is not recommended for American Indians, [place] Natives, or people younger than 65 years unless there are medical conditions that require PPSV23 vaccine. When indicated, people who have unknown immunization and have no record of immunization should receive PPSV23 vaccine. One-time revaccination 5 years after the first dose of PPSV23 is recommended for people aged 19-64 years who have chronic kidney failure, nephrotic syndrome, asplenia, or immunocompromised conditions. People who received 1-2 doses of PPSV23 before age 30 years should receive another dose of PPSV23 vaccine at age 47 years or later if at least 5 years have passed since the previous dose. Doses of PPSV23 are not needed for people immunized with PPSV23 at or after age 21 years.  Meningococcal vaccine. Adults with asplenia or persistent complement component deficiencies should receive 2 doses of quadrivalent meningococcal conjugate (MenACWY-D) vaccine. The doses should be obtained at least 2 months apart. Microbiologists  working with certain meningococcal bacteria, [place] recruits, people at risk during an outbreak, and people who travel to or live in countries with a high rate of meningitis should be immunized. A first-year college student up through age 102 years who is living in a residence hall should receive a dose if he did not receive a dose on or after his 16th birthday. Adults who have certain high-risk conditions should receive one or more doses of vaccine.  Hepatitis A vaccine. Adults who wish to be protected from this disease, have certain high-risk conditions, work with hepatitis  A-infected animals, work in hepatitis A research labs, or travel to or work in countries with a high rate of hepatitis A should be immunized. Adults who were previously unvaccinated and who anticipate close contact with an international adoptee during the first 60 days after arrival in the [place] from a country with a high rate of hepatitis A should be immunized.  Hepatitis B vaccine. Adults should be immunized if they wish to be protected from this disease, have certain high-risk conditions, may be exposed to blood or other infectious body fluids, are household contacts or sex partners of hepatitis B positive people, are clients or workers in certain care facilities, or travel to or work in countries with a high rate of hepatitis B.  Haemophilus influenzae type b (Hib) vaccine. A previously unvaccinated person with asplenia or sickle cell disease or having a scheduled splenectomy should receive 1 dose of Hib vaccine. Regardless of previous immunization, a recipient of a hematopoietic stem cell transplant should receive a 3-dose series 6-12 months after his successful transplant. Hib vaccine is not recommended for adults with HIV infection. Preventive Service / Frequency Ages 67 to 61  Blood pressure check.** / Every 1 to 2 years.  Lipid and cholesterol check.** / Every 5 years beginning at age 44.  Hepatitis C blood  test.** / For any individual with known risks for hepatitis C.  Skin self-exam. / Monthly.  Influenza vaccine. / Every year.  Tetanus, diphtheria, and acellular pertussis (Tdap, Td) vaccine.** / Consult your health care provider. 1 dose of Td every 10 years.  Varicella vaccine.** / Consult your health care provider.  HPV vaccine. / 3 doses over 6 months, if 18 or younger.  Measles, mumps, rubella (MMR) vaccine.** / You need at least 1 dose of MMR if you were born in 1957 or later. You may also need a second dose.  Pneumococcal 13-valent conjugate (PCV13) vaccine.** / Consult your health care provider.  Pneumococcal polysaccharide (PPSV23) vaccine.** / 1 to 2 doses if you smoke cigarettes or if you have certain conditions.  Meningococcal vaccine.** / 1 dose if you are age 29 to 28 years and a Market researcher living in a residence hall, or have one of several medical conditions. You may also need additional booster doses.  Hepatitis A vaccine.** / Consult your health care provider.  Hepatitis B vaccine.** / Consult your health care provider.  Haemophilus influenzae type b (Hib) vaccine.** / Consult your health care provider. Ages 62 to 75  Blood pressure check.** / Every 1 to 2 years.  Lipid and cholesterol check.** / Every 5 years beginning at age 65.  Lung cancer screening. / Every year if you are aged 59-80 years and have a 30-pack-year history of smoking and currently smoke or have quit within the past 15 years. Yearly screening is stopped once you have quit smoking for at least 15 years or develop a health problem that would prevent you from having lung cancer treatment.  Fecal occult blood test (FOBT) of stool. / Every year beginning at age 71 and continuing until age 15. You may not have to do this test if you get a colonoscopy every 10 years.  Flexible sigmoidoscopy** or colonoscopy.** / Every 5 years for a flexible sigmoidoscopy or every 10 years for a colonoscopy  beginning at age 24 and continuing until age 60.  Hepatitis C blood test.** / For all people born from [birth year] through 1965 and any individual with known risks for hepatitis C.  Skin self-exam. / Monthly.  Influenza vaccine. / Every year.  Tetanus, diphtheria, and acellular pertussis (Tdap/Td) vaccine.** / Consult your health care provider. 1 dose of Td every 10 years.  Varicella vaccine.** / Consult your health care provider.  Zoster vaccine.** / 1 dose for adults aged 55 years or older.  Measles, mumps, rubella (MMR) vaccine.** / You need at least 1 dose of MMR if you were born in 1957 or later. You may also need a second dose.  Pneumococcal 13-valent conjugate (PCV13) vaccine.** / Consult your health care provider.  Pneumococcal polysaccharide (PPSV23) vaccine.** / 1 to 2 doses if you smoke cigarettes or if you have certain conditions.  Meningococcal vaccine.** / Consult your health care provider.  Hepatitis A vaccine.** / Consult your health care provider.  Hepatitis B vaccine.** / Consult your health care provider.  Haemophilus influenzae type b (Hib) vaccine.** / Consult your health care provider. Ages 82 and over  Blood pressure check.** / Every 1 to 2 years.  Lipid and cholesterol check.**/ Every 5 years beginning at age 57.  Lung cancer screening. / Every year if you are aged 54-80 years and have a 30-pack-year history of smoking and currently smoke or have quit within the past 15 years. Yearly screening is stopped once you have quit smoking for at least 15 years or develop a health problem that would prevent you from having lung cancer treatment.  Fecal occult blood test (FOBT) of stool. / Every year beginning at age 61 and continuing until age 30. You may not have to do this test if you get a colonoscopy every 10 years.  Flexible sigmoidoscopy** or colonoscopy.** / Every 5 years for a flexible sigmoidoscopy or every 10 years for a colonoscopy beginning at age 51 and  continuing until age 71.  Hepatitis C blood test.** / For all people born from [birth year] through 1965 and any individual with known risks for hepatitis C.  Abdominal aortic aneurysm (AAA) screening.** / A one-time screening for ages 89 to 55 years who are current or former smokers.  Skin self-exam. / Monthly.  Influenza vaccine. / Every year.  Tetanus, diphtheria, and acellular pertussis (Tdap/Td) vaccine.** / 1 dose of Td every 10 years.  Varicella vaccine.** / Consult your health care provider.  Zoster vaccine.** / 1 dose for adults aged 63 years or older.  Pneumococcal 13-valent conjugate (PCV13) vaccine.** / Consult your health care provider.  Pneumococcal polysaccharide (PPSV23) vaccine.** / 1 dose for all adults aged 25 years and older.  Meningococcal vaccine.** / Consult your health care provider.  Hepatitis A vaccine.** / Consult your health care provider.  Hepatitis B vaccine.** / Consult your health care provider.  Haemophilus influenzae type b (Hib) vaccine.** / Consult your health care provider. **Family history and personal history of risk and conditions may change your health care provider's recommendations. Document Released: 01/17/2002 Document Revised: 11/26/2013 Document Reviewed: 04/18/2011 [facility] Patient Information 2015 [place], [place]. This information is not intended to replace advice given to you by your health care provider. Make sure you discuss any questions you have with your health care provider.   Thank you for coming in for your Medicare annual wellness visit today!

## 2015-02-20 ENCOUNTER — Other Ambulatory Visit: Payer: Medicare Other

## 2015-02-20 NOTE — Progress Notes (Signed)
LAB ONLY 

## 2015-02-22 LAB — FECAL OCCULT BLOOD, IMMUNOCHEMICAL: Fecal Occult Bld: NEGATIVE

## 2015-06-18 ENCOUNTER — Encounter: Payer: Self-pay | Admitting: Family Medicine

## 2015-06-18 ENCOUNTER — Ambulatory Visit (INDEPENDENT_AMBULATORY_CARE_PROVIDER_SITE_OTHER): Payer: Medicare Other | Admitting: Family Medicine

## 2015-06-18 ENCOUNTER — Encounter (INDEPENDENT_AMBULATORY_CARE_PROVIDER_SITE_OTHER): Payer: Self-pay

## 2015-06-18 VITALS — BP 135/65 | HR 52 | Temp 97.4°F | Ht 69.0 in | Wt 187.2 lb

## 2015-06-18 DIAGNOSIS — E785 Hyperlipidemia, unspecified: Secondary | ICD-10-CM | POA: Diagnosis not present

## 2015-06-18 DIAGNOSIS — D696 Thrombocytopenia, unspecified: Secondary | ICD-10-CM | POA: Diagnosis not present

## 2015-06-18 DIAGNOSIS — N400 Enlarged prostate without lower urinary tract symptoms: Secondary | ICD-10-CM | POA: Diagnosis not present

## 2015-06-18 DIAGNOSIS — M5136 Other intervertebral disc degeneration, lumbar region: Secondary | ICD-10-CM

## 2015-06-18 DIAGNOSIS — E559 Vitamin D deficiency, unspecified: Secondary | ICD-10-CM

## 2015-06-18 LAB — POCT CBC
Granulocyte percent: 56.4 %G (ref 37–80)
HCT, POC: 41.6 % — AB (ref 43.5–53.7)
HEMOGLOBIN: 13.9 g/dL — AB (ref 14.1–18.1)
Lymph, poc: 2.1 (ref 0.6–3.4)
MCH, POC: 30.2 pg (ref 27–31.2)
MCHC: 33.5 g/dL (ref 31.8–35.4)
MCV: 90.1 fL (ref 80–97)
MPV: 7.4 fL (ref 0–99.8)
POC Granulocyte: 3 (ref 2–6.9)
POC LYMPH PERCENT: 38.4 %L (ref 10–50)
Platelet Count, POC: 127 10*3/uL — AB (ref 142–424)
RBC: 4.62 M/uL — AB (ref 4.69–6.13)
RDW, POC: 12.7 %
WBC: 5.4 10*3/uL (ref 4.6–10.2)

## 2015-06-18 NOTE — Patient Instructions (Addendum)
Continue current medications. Continue good therapeutic lifestyle changes which include good diet and exercise. Fall precautions discussed with patient. If an FOBT was given today- please return it to our front desk. If you are over 77 years old - you may need Prevnar 4 or the adult Pneumonia vaccine.  Please call back in September to schedule your flu vaccine.    The patient should continue to be careful and not putting himself at risk for falling. He should exercise regularly and walk regularly He should follow-up with the urologist as planned We will call him with his lab work results once they become available

## 2015-06-18 NOTE — Progress Notes (Signed)
Subjective:    Patient ID: Paul Parks, male    DOB: Aug 06, 1936, 79 y.o.   MRN: [id number]  HPI Patient is here today for a follow up on his chronic medical problems. The patient is doing well with no specific complaints. He sees Dr. Jeffie Pollock the urologist regularly for his prostate exam. He is recently visited the dermatologist and has had multiple precancerous lesions treated on his face. He will be getting lab work today. The patient denies chest pain shortness of breath any problems with his GI tract or problems passing his water other than going frequently to the bathroom. He sees Dr. Jeffie Pollock yearly. He also continues to have off and on back pain and this seems to run in the family.   Review of Systems  Constitutional: Negative.   HENT: Negative.   Eyes: Negative.   Respiratory: Negative.   Cardiovascular: Negative.   Gastrointestinal: Negative.   Endocrine: Negative.   Genitourinary: Negative.   Musculoskeletal: Negative.   Skin: Negative.   Allergic/Immunologic: Negative.   Neurological: Negative.   Hematological: Negative.   Psychiatric/Behavioral: Negative.           Patient Active Problem List   Diagnosis Date Noted  . Glaucoma 02/18/2015  . Thrombocytopenia 09/24/2014  . Degenerative disc disease, lumbar 10/08/2013  . BPH (benign prostatic hypertrophy) 05/30/2013  . Labyrinthitis 05/30/2013  . Hyperlipemia 03/12/2013  . Personal history of colonic polyps - adenomas 04/24/2008   Outpatient Encounter Prescriptions as of 06/18/2015  Medication Sig  . aspirin 81 MG tablet Take 81 mg by mouth daily.  . cholecalciferol (VITAMIN D) 1000 UNITS tablet Take 2,000 Units by mouth daily.   Marland Kitchen dutasteride (AVODART) 0.5 MG capsule Take 0.5 mg by mouth daily. qod  . fluticasone (FLONASE) 50 MCG/ACT nasal spray INSTILL 1 SPRAY IN EACH NOSTRIL DAILY (Patient taking differently: INSTILL 1 SPRAY IN EACH NOSTRIL DAILY prn)  . meclizine (ANTIVERT) 12.5 MG tablet TAKE 1 TABLET 4  TIMES A DAY WITH MEALS AND AT BEDTIME FOR DIZZINESS  . meloxicam (MOBIC) 15 MG tablet TAKE 1/2 TO 1 TABLET BY MOUTH EVERY DAY AFTER MEALS (Patient taking differently: TAKE 1/2 TO 1 TABLET BY MOUTH EVERY DAY AFTER MEALS PRN)  . Multiple Vitamin (MULTIVITAMIN WITH MINERALS) TABS tablet Take 1 tablet by mouth daily.  . rosuvastatin (CRESTOR) 10 MG tablet Take 1 tablet (10 mg total) by mouth daily. 1/2 qd  . TRAVATAN Z 0.004 % SOLN ophthalmic solution    No facility-administered encounter medications on file as of 06/18/2015.      Objective:   Physical Exam  Constitutional: He is oriented to person, place, and time. He appears well-developed and well-nourished. No distress.  HENT:  Head: Normocephalic and atraumatic.  Right Ear: External ear normal.  Left Ear: External ear normal.  Nose: Nose normal.  Mouth/Throat: Oropharynx is clear and moist. No oropharyngeal exudate.  Eyes: Conjunctivae and EOM are normal. Pupils are equal, round, and reactive to light. Right eye exhibits no discharge. Left eye exhibits no discharge. No scleral icterus.  Neck: Normal range of motion. Neck supple. No thyromegaly present.  Cardiovascular: Normal rate, regular rhythm, normal heart sounds and intact distal pulses.  Exam reveals no gallop and no friction rub.   No murmur heard. At 72/m  Pulmonary/Chest: Effort normal and breath sounds normal. No respiratory distress. He has no wheezes. He has no rales. He exhibits no tenderness.  No axillary adenopathy  Abdominal: Soft. Bowel sounds are normal. He exhibits no mass.  There is no tenderness. There is no rebound and no guarding.  The abdomen was nontender without masses or organ enlargement or bruits  Musculoskeletal: Normal range of motion. He exhibits no edema or tenderness.  Lymphadenopathy:    He has no cervical adenopathy.  Neurological: He is alert and oriented to person, place, and time. He has normal reflexes. No cranial nerve deficit.  Skin: Skin is  warm and dry. No rash noted. No erythema. No pallor.  The patient has had multiple precancerous lesions recently frozen on his face by his dermatologist at [facility].  Psychiatric: He has a normal mood and affect. His behavior is normal. Judgment and thought content normal.  Nursing note and vitals reviewed.   BP 135/65 mmHg  Pulse 52  Temp(Src) 97.4 F (36.3 C) (Oral)  Ht _0  (1.753 m)  Wt 187 lb 3.2 oz (84.913 kg)  BMI 27.63 kg/m2       Assessment & Plan:  1. Hyperlipemia -Patient will continue his current cholesterol treatment pending results of lab work - POCT CBC - [place], lipoprofile  2. Vitamin D deficiency -He will continue with his vitamin D replacement contingent upon lab work that will - POCT CBC - Vit D  25 hydroxy (rtn osteoporosis monitoring)  3. BPH (benign prostatic hypertrophy) -He is followed regularly by the urologist for his BPH  4. Degenerative disc disease, lumbar -This is stable at the current time and he knows to be careful to not put himself at risk for falling  5. Thrombocytopenia -The patient's had no bleeding issues and no adverse effects from this  No orders of the defined types were placed in this encounter.   Patient Instructions  Continue current medications. Continue good therapeutic lifestyle changes which include good diet and exercise. Fall precautions discussed with patient. If an FOBT was given today- please return it to our front desk. If you are over 61 years old - you may need Prevnar 44 or the adult Pneumonia vaccine.  Please call back in September to schedule your flu vaccine.    The patient should continue to be careful and not putting himself at risk for falling. He should exercise regularly and walk regularly He should follow-up with the urologist as planned We will call him with his lab work results once they become available   Arrie Senate MD

## 2015-06-19 LAB — BMP8+EGFR
BUN/Creatinine Ratio: 12 (ref 10–22)
BUN: 12 mg/dL (ref 8–27)
CHLORIDE: 99 mmol/L (ref 97–108)
CO2: 28 mmol/L (ref 18–29)
CREATININE: 0.97 mg/dL (ref 0.76–1.27)
Calcium: 9.1 mg/dL (ref 8.6–10.2)
GFR calc Af Amer: 87 mL/min/{1.73_m2} (ref >59)
GFR calc non Af Amer: 76 mL/min/{1.73_m2} (ref >59)
Glucose: 94 mg/dL (ref 65–99)
Potassium: 4.4 mmol/L (ref 3.5–5.2)
Sodium: 140 mmol/L (ref 134–144)

## 2015-06-19 LAB — NMR, LIPOPROFILE
Cholesterol: 190 mg/dL (ref 100–199)
HDL Cholesterol by NMR: 62 mg/dL (ref >39)
HDL Particle Number: 32.1 umol/L (ref >=30.5)
LDL Particle Number: 1224 nmol/L — ABNORMAL HIGH (ref <1000)
LDL Size: 20.8 nm (ref >20.5)
LDL-C: 108 mg/dL — ABNORMAL HIGH (ref 0–99)
Small LDL Particle Number: 466 nmol/L (ref <=527)
Triglycerides by NMR: 101 mg/dL (ref 0–149)

## 2015-06-19 LAB — VITAMIN D 25 HYDROXY (VIT D DEFICIENCY, FRACTURES): Vit D, 25-Hydroxy: 41.6 ng/mL (ref 30.0–100.0)

## 2015-06-29 ENCOUNTER — Ambulatory Visit (INDEPENDENT_AMBULATORY_CARE_PROVIDER_SITE_OTHER): Payer: Medicare Other | Admitting: Family Medicine

## 2015-06-29 ENCOUNTER — Encounter: Payer: Self-pay | Admitting: Family Medicine

## 2015-06-29 VITALS — BP 117/64 | HR 70 | Temp 97.7°F | Ht 69.0 in | Wt 188.0 lb

## 2015-06-29 DIAGNOSIS — IMO0002 Reserved for concepts with insufficient information to code with codable children: Secondary | ICD-10-CM

## 2015-06-29 DIAGNOSIS — M25511 Pain in right shoulder: Secondary | ICD-10-CM | POA: Diagnosis not present

## 2015-06-29 DIAGNOSIS — R229 Localized swelling, mass and lump, unspecified: Secondary | ICD-10-CM | POA: Diagnosis not present

## 2015-06-29 DIAGNOSIS — M542 Cervicalgia: Secondary | ICD-10-CM

## 2015-06-29 MED ORDER — MELOXICAM 15 MG PO TABS: ORAL_TABLET | ORAL | Status: DC

## 2015-06-29 NOTE — Progress Notes (Signed)
Subjective:    Patient ID: Paul Parks, male    DOB: 10-Mar-1936, 79 y.o.   MRN: [id number]  HPI Patient here today for neck pain and right shoulder knot. The patient was weed eating on Saturday and awoke yesterday morning with a lump on the superior right shoulder area. He has some pain to his neck and to his shoulder from this lump. There is no bruising noted. He indicates that it may be somewhat smaller today than it was yesterday.     Patient Active Problem List   Diagnosis Date Noted  . Glaucoma 02/18/2015  . Thrombocytopenia 09/24/2014  . Degenerative disc disease, lumbar 10/08/2013  . BPH (benign prostatic hypertrophy) 05/30/2013  . Labyrinthitis 05/30/2013  . Hyperlipemia 03/12/2013  . Personal history of colonic polyps - adenomas 04/24/2008   Outpatient Encounter Prescriptions as of 06/29/2015  Medication Sig  . aspirin 81 MG tablet Take 81 mg by mouth daily.  . cholecalciferol (VITAMIN D) 1000 UNITS tablet Take 2,000 Units by mouth daily.   Marland Kitchen dutasteride (AVODART) 0.5 MG capsule Take 0.5 mg by mouth daily. qod  . fluticasone (FLONASE) 50 MCG/ACT nasal spray INSTILL 1 SPRAY IN EACH NOSTRIL DAILY (Patient taking differently: INSTILL 1 SPRAY IN EACH NOSTRIL DAILY prn)  . meclizine (ANTIVERT) 12.5 MG tablet TAKE 1 TABLET 4 TIMES A DAY WITH MEALS AND AT BEDTIME FOR DIZZINESS  . meloxicam (MOBIC) 15 MG tablet TAKE 1/2 TO 1 TABLET BY MOUTH EVERY DAY AFTER MEALS (Patient taking differently: TAKE 1/2 TO 1 TABLET BY MOUTH EVERY DAY AFTER MEALS PRN)  . Multiple Vitamin (MULTIVITAMIN WITH MINERALS) TABS tablet Take 1 tablet by mouth daily.  . rosuvastatin (CRESTOR) 10 MG tablet Take 1 tablet (10 mg total) by mouth daily. 1/2 qd  . TRAVATAN Z 0.004 % SOLN ophthalmic solution    No facility-administered encounter medications on file as of 06/29/2015.      Review of Systems  Constitutional: Negative.   HENT: Negative.   Eyes: Negative.   Respiratory: Negative.   Cardiovascular:  Negative.   Gastrointestinal: Negative.   Endocrine: Negative.   Genitourinary: Negative.   Musculoskeletal: Positive for arthralgias (neck pain and right shoulder knot).  Skin: Negative.   Allergic/Immunologic: Negative.   Neurological: Negative.   Hematological: Negative.   Psychiatric/Behavioral: Negative.        Objective:   Physical Exam  Constitutional: He is oriented to person, place, and time. He appears well-developed and well-nourished. He appears distressed.  HENT:  Head: Normocephalic.  Eyes: Conjunctivae and EOM are normal. Pupils are equal, round, and reactive to light. Right eye exhibits no discharge. Left eye exhibits no discharge. No scleral icterus.  Neck: Normal range of motion. Neck supple. No thyromegaly present.  Cardiovascular: Normal rate, regular rhythm and normal heart sounds.   No murmur heard. Pulmonary/Chest: Effort normal and breath sounds normal. He has no wheezes. He has no rales.  Musculoskeletal: Normal range of motion. He exhibits no tenderness.  There is a fullness in the right suprascapular area without bruising and with minimal tenderness.  Neurological: He is alert and oriented to person, place, and time.  Skin: Skin is warm. No rash noted. No erythema.  Psychiatric: He has a normal mood and affect. His behavior is normal. Judgment and thought content normal.  Nursing note and vitals reviewed.  BP 117/64 mmHg  Pulse 70  Temp(Src) 97.7 F (36.5 C) (Oral)  Ht 5\' 9"  (1.753 m)  Wt 188 lb (85.276 kg)  BMI 27.75  kg/m2 There is a lump on the superior right shoulder which is somewhat firm and asymmetric to the left shoulder. It is basically in the supra scapular area within the muscle sheath.       Assessment & Plan:  1. Neck pain -The patient has a history of degenerative disc disease with disc problems in his neck back to 2001. This x-ray result was reviewed with him and he was given a copy of the report. - [name] Misc Soft Tissue;  Future  2. Right shoulder pain -He should use cold compresses off and on for the next 24 hours then warm wet compresses and start meloxicam 7/2 mg 1 daily for the next 7-10 days after eating. - [name] Misc Soft Tissue; Future  3. Lump -This is most likely traumatic in nature because of his use of the weed eater and the sudden appearance of this. We will get an ultrasound for confirmation. - [name] Misc Soft Tissue; Future  Meds ordered this encounter  Medications  . meloxicam (MOBIC) 15 MG tablet    Sig: TAKE 1/2 TO 1 TABLET BY MOUTH EVERY DAY AFTER a meal.    Dispense:  30 tablet    Refill:  2   Patient Instructions  Use cold compresses or ice packs off and on for the next 24 hours, then use warm wet compresses 20 minutes 3 or 4 times daily Take meloxicam 15 mg, one half tablet daily after eating for the next 7-8 days. We will arrange for you to get an ultrasound of the right superior shoulder Avoid any heavy lifting pushing or pulling for the next few days until this area resolves   Arrie Senate MD

## 2015-06-29 NOTE — Patient Instructions (Signed)
Use cold compresses or ice packs off and on for the next 24 hours, then use warm wet compresses 20 minutes 3 or 4 times daily Take meloxicam 15 mg, one half tablet daily after eating for the next 7-8 days. We will arrange for you to get an ultrasound of the right superior shoulder Avoid any heavy lifting pushing or pulling for the next few days until this area resolves

## 2015-07-01 ENCOUNTER — Ambulatory Visit: Payer: Medicare Other | Admitting: Family Medicine

## 2015-07-01 ENCOUNTER — Ambulatory Visit
Admission: RE | Admit: 2015-07-01 | Discharge: 2015-07-01 | Disposition: A | Discharge status: Home / Self Care | Payer: Medicare Other | Source: Ambulatory Visit | Attending: Family Medicine | Admitting: Family Medicine

## 2015-07-01 ENCOUNTER — Other Ambulatory Visit: Payer: Self-pay | Admitting: Family Medicine

## 2015-07-01 DIAGNOSIS — R229 Localized swelling, mass and lump, unspecified: Secondary | ICD-10-CM

## 2015-07-01 DIAGNOSIS — IMO0002 Reserved for concepts with insufficient information to code with codable children: Secondary | ICD-10-CM

## 2015-07-01 DIAGNOSIS — M25511 Pain in right shoulder: Secondary | ICD-10-CM

## 2015-07-01 DIAGNOSIS — M542 Cervicalgia: Secondary | ICD-10-CM

## 2015-07-03 ENCOUNTER — Other Ambulatory Visit: Payer: Self-pay | Admitting: Family Medicine

## 2015-07-03 DIAGNOSIS — R911 Solitary pulmonary nodule: Secondary | ICD-10-CM

## 2015-07-07 ENCOUNTER — Ambulatory Visit: Payer: Medicare Other | Admitting: Family Medicine

## 2015-07-08 ENCOUNTER — Ambulatory Visit (HOSPITAL_COMMUNITY)
Admission: RE | Admit: 2015-07-08 | Discharge: 2015-07-08 | Disposition: A | Discharge status: Home / Self Care | Payer: Medicare Other | Source: Ambulatory Visit | Attending: Family Medicine | Admitting: Family Medicine

## 2015-07-08 DIAGNOSIS — R911 Solitary pulmonary nodule: Secondary | ICD-10-CM | POA: Diagnosis not present

## 2015-07-08 MED ORDER — IOHEXOL 300 MG/ML  SOLN
75.0000 mL | Freq: Once | INTRAMUSCULAR | Status: AC | PRN
  Administered 2015-07-08: 75 mL via INTRAVENOUS

## 2015-07-10 ENCOUNTER — Ambulatory Visit: Payer: Medicare Other | Admitting: Family Medicine

## 2015-10-13 ENCOUNTER — Ambulatory Visit (INDEPENDENT_AMBULATORY_CARE_PROVIDER_SITE_OTHER): Payer: Medicare Other

## 2015-10-13 DIAGNOSIS — Z23 Encounter for immunization: Secondary | ICD-10-CM | POA: Diagnosis not present

## 2015-11-10 ENCOUNTER — Encounter: Payer: Self-pay | Admitting: Family Medicine

## 2015-11-10 ENCOUNTER — Ambulatory Visit (INDEPENDENT_AMBULATORY_CARE_PROVIDER_SITE_OTHER): Payer: Medicare Other | Admitting: Family Medicine

## 2015-11-10 VITALS — BP 114/60 | HR 52 | Temp 98.1°F | Ht 69.0 in | Wt 186.0 lb

## 2015-11-10 DIAGNOSIS — N400 Enlarged prostate without lower urinary tract symptoms: Secondary | ICD-10-CM | POA: Diagnosis not present

## 2015-11-10 DIAGNOSIS — E785 Hyperlipidemia, unspecified: Secondary | ICD-10-CM

## 2015-11-10 DIAGNOSIS — E559 Vitamin D deficiency, unspecified: Secondary | ICD-10-CM | POA: Diagnosis not present

## 2015-11-10 DIAGNOSIS — D696 Thrombocytopenia, unspecified: Secondary | ICD-10-CM

## 2015-11-10 DIAGNOSIS — H8303 Labyrinthitis, bilateral: Secondary | ICD-10-CM | POA: Diagnosis not present

## 2015-11-10 MED ORDER — MECLIZINE HCL 12.5 MG PO TABS: ORAL_TABLET | ORAL | Status: DC

## 2015-11-10 NOTE — Patient Instructions (Addendum)
Medicare Annual Wellness Visit  [place] and the medical providers at [facility] strive to bring you the best medical care.  In doing so we not only want to address your current medical conditions and concerns but also to detect new conditions early and prevent illness, disease and health-related problems.    Medicare offers a yearly Wellness Visit which allows our clinical staff to assess your need for preventative services including immunizations, lifestyle education, counseling to decrease risk of preventable diseases and screening for fall risk and other medical concerns.    This visit is provided free of charge (no copay) for all Medicare recipients. The clinical pharmacists at [facility] have begun to conduct these Wellness Visits which will also include a thorough review of all your medications.    As you primary medical provider recommend that you make an appointment for your Annual Wellness Visit if you have not done so already this year.  You may set up this appointment before you leave today or you may call back [phone] and schedule an appointment.  Please make sure when you call that you mention that you are scheduling your Annual Wellness Visit with the clinical pharmacist so that the appointment may be made for the proper length of time.     Continue current medications. Continue good therapeutic lifestyle changes which include good diet and exercise. Fall precautions discussed with patient. If an FOBT was given today- please return it to our front desk. If you are over 20 years old - you may need Prevnar 78 or the adult Pneumonia vaccine.  **Flu shots are available--- please call and schedule a FLU-CLINIC appointment**  After your visit with [name] today you will receive a survey in the mail or online from [employer] regarding your care with [name]. Please take a moment to fill this out. Your feedback is very  important to [name] as you can help [name] better understand your patient needs as well as improve your experience and satisfaction. WE CARE ABOUT YOU!!!   The patient should continue to drink plenty of fluids and stay well hydrated He should use nasal saline regularly He should use the Flonase regularly 1 spray each nostril at bedtime He should be careful not put himself at risk for falling We will refill his meclizine to take as needed He should keep his appointment with the urologist tomorrow for his BPH We will also arrange for him to see an ear nose and throat specialist because of the persistent bouts of in her ear and dizziness that seem to be seasonally related.

## 2015-11-10 NOTE — Addendum Note (Signed)
Addended by: Zannie Cove on: 11/10/2015 05:12 PM   Modules accepted: Orders

## 2015-11-10 NOTE — Progress Notes (Signed)
Subjective:    Patient ID: Paul Parks, male    DOB: 1935-12-08, 79 y.o.   MRN: [id number]  HPI Pt here for follow up and management of chronic medical problems which includes hyperlipidemia. He is taking medications regularly. The patient continues to have in her ear and vertigo issues. He is been very sick with this recently and literally cannot get out of the bed for for 5 days because of dizziness and vomiting. The patient denies chest pain shortness of breath trouble swallowing and heartburn indigestion blood in the stool or black tarry bowel movements. On review of his x-rays he did have a previous CT scan and as her results of this there were some areas in the liver that the radiologist felt like should be reviewed again with an MRI 6-12 months after the previous exam which was in August. The x-ray technician was given this to put on her call back information and the patient was informed that we would get the appropriate exam at the appropriate time. The patient continues to pass his water frequently and this is because of his BPH and as I said he is seeing the urologist tomorrow.      Patient Active Problem List   Diagnosis Date Noted  . Glaucoma 02/18/2015  . Thrombocytopenia ([place]) 09/24/2014  . Degenerative disc disease, lumbar 10/08/2013  . BPH (benign prostatic hypertrophy) 05/30/2013  . Labyrinthitis 05/30/2013  . Hyperlipemia 03/12/2013  . Personal history of colonic polyps - adenomas 04/24/2008   Outpatient Encounter Prescriptions as of 11/10/2015  Medication Sig  . aspirin 81 MG tablet Take 81 mg by mouth daily.  . cholecalciferol (VITAMIN D) 1000 UNITS tablet Take 2,000 Units by mouth daily.   Marland Kitchen dutasteride (AVODART) 0.5 MG capsule Take 0.5 mg by mouth daily. qod  . fluticasone (FLONASE) 50 MCG/ACT nasal spray INSTILL 1 SPRAY IN EACH NOSTRIL DAILY (Patient taking differently: INSTILL 1 SPRAY IN EACH NOSTRIL DAILY prn)  . meclizine (ANTIVERT) 12.5 MG tablet TAKE 1  TABLET 4 TIMES A DAY WITH MEALS AND AT BEDTIME FOR DIZZINESS  . meloxicam (MOBIC) 15 MG tablet TAKE 1/2 TO 1 TABLET BY MOUTH EVERY DAY AFTER a meal.  . Multiple Vitamin (MULTIVITAMIN WITH MINERALS) TABS tablet Take 1 tablet by mouth daily.  . rosuvastatin (CRESTOR) 10 MG tablet Take 1 tablet (10 mg total) by mouth daily. 1/2 qd  . TRAVATAN Z 0.004 % SOLN ophthalmic solution    No facility-administered encounter medications on file as of 11/10/2015.     Review of Systems  Constitutional: Negative.   HENT: Negative.        Vertigo - inner ear  Eyes: Negative.   Respiratory: Negative.   Cardiovascular: Negative.   Gastrointestinal: Negative.   Endocrine: Negative.   Genitourinary: Negative.   Musculoskeletal: Negative.   Skin: Negative.   Allergic/Immunologic: Negative.   Neurological: Negative.   Hematological: Negative.   Psychiatric/Behavioral: Negative.        Objective:   Physical Exam  Constitutional: He is oriented to person, place, and time. He appears well-developed and well-nourished. No distress.  HENT:  Head: Normocephalic and atraumatic.  Right Ear: External ear normal.  Left Ear: External ear normal.  Mouth/Throat: Oropharynx is clear and moist. No oropharyngeal exudate.  Nasal congestion and turbinate swelling bilaterally  Eyes: Conjunctivae and EOM are normal. Pupils are equal, round, and reactive to light. Right eye exhibits no discharge. Left eye exhibits no discharge. No scleral icterus.  Neck: Normal range of motion.  Neck supple. No thyromegaly present.  Cardiovascular: Normal rate, regular rhythm, normal heart sounds and intact distal pulses.   No murmur heard. At 60/m with a regular rate and rhythm  Pulmonary/Chest: Effort normal and breath sounds normal. No respiratory distress. He has no wheezes. He has no rales. He exhibits no tenderness.  Clear anteriorly and posteriorly  Abdominal: Soft. Bowel sounds are normal. He exhibits no mass. There is no  tenderness. There is no rebound and no guarding.  There is slight suprapubic tenderness and left upper quadrant. And there are no bruits.  Genitourinary:  He is to get his prostate exam tomorrow by Dr. Jeffie Pollock.  Musculoskeletal: Normal range of motion. He exhibits no edema or tenderness.  Lymphadenopathy:    He has no cervical adenopathy.  Neurological: He is alert and oriented to person, place, and time. He has normal reflexes. No cranial nerve deficit.  The patient has some dizziness with laying down and was sitting up  Skin: Skin is warm and dry. No rash noted.  Psychiatric: He has a normal mood and affect. His behavior is normal. Judgment and thought content normal.  Nursing note and vitals reviewed.  BP 114/60 mmHg  Pulse 52  Temp(Src) 98.1 F (36.7 C) (Oral)  Ht _0  (1.753 m)  Wt 186 lb (84.369 kg)  BMI 27.45 kg/m2        Assessment & Plan:  1. Hyperlipemia -Continue current treatment pending results of lab work - BMP8+EGFR - CBC with Differential/Platelet - Hepatic function panel - NMR, lipoprofile  2. Vitamin D deficiency -Continue current treatment pending results of lab work - CBC with Differential/Platelet - VITAMIN D 25 Hydroxy (Vit-D Deficiency, Fractures)  3. BPH (benign prostatic hypertrophy) -Follow-up with the urologist tomorrow as planned - CBC with Differential/Platelet  4. Thrombocytopenia ([place]) -The patient is having no bleeding symptoms and we will continue to monitor this. - CBC with Differential/Platelet  5. Labyrinthitis, bilateral -Because of the persistent dizziness and especially recently that appears to be seasonal in nature patient will be referred to the ear nose and throat specialist for further evaluation - Ambulatory referral to ENT  No orders of the defined types were placed in this encounter.   Patient Instructions                       Medicare Annual Wellness Visit  [place] and the medical providers at [facility] strive to bring you the best medical care.  In doing so we not only want to address your current medical conditions and concerns but also to detect new conditions early and prevent illness, disease and health-related problems.    Medicare offers a yearly Wellness Visit which allows our clinical staff to assess your need for preventative services including immunizations, lifestyle education, counseling to decrease risk of preventable diseases and screening for fall risk and other medical concerns.    This visit is provided free of charge (no copay) for all Medicare recipients. The clinical pharmacists at [facility] have begun to conduct these Wellness Visits which will also include a thorough review of all your medications.    As you primary medical provider recommend that you make an appointment for your Annual Wellness Visit if you have not done so already this year.  You may set up this appointment before you leave today or you may call back [phone]) and schedule an appointment.  Please make sure when you call that you  mention that you are scheduling your Annual Wellness Visit with the clinical pharmacist so that the appointment may be made for the proper length of time.     Continue current medications. Continue good therapeutic lifestyle changes which include good diet and exercise. Fall precautions discussed with patient. If an FOBT was given today- please return it to our front desk. If you are over 23 years old - you may need Prevnar 67 or the adult Pneumonia vaccine.  **Flu shots are available--- please call and schedule a FLU-CLINIC appointment**  After your visit with [name] today you will receive a survey in the mail or online from [employer] regarding your care with [name]. Please take a moment to fill this out. Your feedback is very important to [name] as you can help [name] better understand your patient needs as well as improve your experience and  satisfaction. WE CARE ABOUT YOU!!!   The patient should continue to drink plenty of fluids and stay well hydrated He should use nasal saline regularly He should use the Flonase regularly 1 spray each nostril at bedtime He should be careful not put himself at risk for falling We will refill his meclizine to take as needed He should keep his appointment with the urologist tomorrow for his BPH We will also arrange for him to see an ear nose and throat specialist because of the persistent bouts of in her ear and dizziness that seem to be seasonally related.   Arrie Senate MD

## 2015-11-11 LAB — CBC WITH DIFFERENTIAL/PLATELET
BASOS: 0 %
Basophils Absolute: 0 10*3/uL (ref 0.0–0.2)
EOS (ABSOLUTE): 0.1 10*3/uL (ref 0.0–0.4)
Eos: 1 %
HEMOGLOBIN: 14.2 g/dL (ref 12.6–17.7)
Hematocrit: 41.4 % (ref 37.5–51.0)
IMMATURE GRANS (ABS): 0 10*3/uL (ref 0.0–0.1)
IMMATURE GRANULOCYTES: 0 %
Lymphocytes Absolute: 1.7 10*3/uL (ref 0.7–3.1)
Lymphs: 31 %
MCH: 31 pg (ref 26.6–33.0)
MCHC: 34.3 g/dL (ref 31.5–35.7)
MCV: 90 fL (ref 79–97)
MONOCYTES: 7 %
Monocytes Absolute: 0.4 10*3/uL (ref 0.1–0.9)
NEUTROS ABS: 3.4 10*3/uL (ref 1.4–7.0)
NEUTROS PCT: 61 %
PLATELETS: 151 10*3/uL (ref 150–379)
RBC: 4.58 x10E6/uL (ref 4.14–5.80)
RDW: 13.3 % (ref 12.3–15.4)
WBC: 5.5 10*3/uL (ref 3.4–10.8)

## 2015-11-11 LAB — HEPATIC FUNCTION PANEL
ALK PHOS: 52 IU/L (ref 39–117)
ALT: 13 IU/L (ref 0–44)
AST: 16 IU/L (ref 0–40)
Albumin: 4.3 g/dL (ref 3.5–4.8)
BILIRUBIN TOTAL: 0.6 mg/dL (ref 0.0–1.2)
Bilirubin, Direct: 0.14 mg/dL (ref 0.00–0.40)
TOTAL PROTEIN: 6.3 g/dL (ref 6.0–8.5)

## 2015-11-11 LAB — NMR, LIPOPROFILE
CHOLESTEROL: 145 mg/dL (ref 100–199)
HDL Cholesterol by NMR: 60 mg/dL (ref >39)
HDL Particle Number: 29.5 umol/L — ABNORMAL LOW (ref >=30.5)
LDL PARTICLE NUMBER: 828 nmol/L (ref <1000)
LDL SIZE: 20.8 nm (ref >20.5)
LDL-C: 67 mg/dL (ref 0–99)
SMALL LDL PARTICLE NUMBER: 298 nmol/L (ref <=527)
TRIGLYCERIDES BY NMR: 88 mg/dL (ref 0–149)

## 2015-11-11 LAB — BMP8+EGFR
BUN/Creatinine Ratio: 13 (ref 10–22)
BUN: 13 mg/dL (ref 8–27)
CO2: 26 mmol/L (ref 18–29)
CREATININE: 1.02 mg/dL (ref 0.76–1.27)
Calcium: 9.3 mg/dL (ref 8.6–10.2)
Chloride: 101 mmol/L (ref 97–106)
GFR calc Af Amer: 82 mL/min/{1.73_m2} (ref >59)
GFR, EST NON AFRICAN AMERICAN: 71 mL/min/{1.73_m2} (ref >59)
GLUCOSE: 90 mg/dL (ref 65–99)
Potassium: 4.7 mmol/L (ref 3.5–5.2)
Sodium: 140 mmol/L (ref 136–144)

## 2015-11-11 LAB — VITAMIN D 25 HYDROXY (VIT D DEFICIENCY, FRACTURES): Vit D, 25-Hydroxy: 45.2 ng/mL (ref 30.0–100.0)

## 2015-12-06 HISTORY — PX: SKIN LESION EXCISION: SHX2412

## 2015-12-29 ENCOUNTER — Other Ambulatory Visit: Payer: Self-pay | Admitting: Family Medicine

## 2015-12-29 DIAGNOSIS — R160 Hepatomegaly, not elsewhere classified: Secondary | ICD-10-CM

## 2015-12-31 ENCOUNTER — Other Ambulatory Visit: Payer: Self-pay | Admitting: Family Medicine

## 2015-12-31 ENCOUNTER — Other Ambulatory Visit (HOSPITAL_COMMUNITY): Payer: Self-pay | Admitting: *Deleted

## 2016-01-19 ENCOUNTER — Ambulatory Visit (HOSPITAL_COMMUNITY)
Admission: RE | Admit: 2016-01-19 | Discharge: 2016-01-19 | Disposition: A | Discharge status: Home / Self Care | Payer: Medicare Other | Source: Ambulatory Visit | Attending: Family Medicine | Admitting: Family Medicine

## 2016-01-19 DIAGNOSIS — R160 Hepatomegaly, not elsewhere classified: Secondary | ICD-10-CM | POA: Diagnosis not present

## 2016-01-19 DIAGNOSIS — K7689 Other specified diseases of liver: Secondary | ICD-10-CM | POA: Insufficient documentation

## 2016-01-19 LAB — POCT I-STAT CREATININE: Creatinine, Ser: 1.1 mg/dL (ref 0.61–1.24)

## 2016-01-19 MED ORDER — GADOBENATE DIMEGLUMINE 529 MG/ML IV SOLN
17.0000 mL | Freq: Once | INTRAVENOUS | Status: AC | PRN
  Administered 2016-01-19: 17 mL via INTRAVENOUS

## 2016-02-15 ENCOUNTER — Encounter: Payer: Self-pay | Admitting: Internal Medicine

## 2016-03-11 ENCOUNTER — Other Ambulatory Visit: Payer: Self-pay | Admitting: Family Medicine

## 2016-03-30 ENCOUNTER — Ambulatory Visit (INDEPENDENT_AMBULATORY_CARE_PROVIDER_SITE_OTHER): Payer: Medicare Other | Admitting: Family Medicine

## 2016-03-30 ENCOUNTER — Encounter (INDEPENDENT_AMBULATORY_CARE_PROVIDER_SITE_OTHER): Payer: Self-pay

## 2016-03-30 ENCOUNTER — Encounter: Payer: Self-pay | Admitting: *Deleted

## 2016-03-30 ENCOUNTER — Encounter: Payer: Self-pay | Admitting: Family Medicine

## 2016-03-30 VITALS — BP 98/58 | HR 62 | Temp 97.8°F | Ht 69.0 in | Wt 187.0 lb

## 2016-03-30 DIAGNOSIS — E785 Hyperlipidemia, unspecified: Secondary | ICD-10-CM

## 2016-03-30 DIAGNOSIS — H8303 Labyrinthitis, bilateral: Secondary | ICD-10-CM

## 2016-03-30 DIAGNOSIS — D696 Thrombocytopenia, unspecified: Secondary | ICD-10-CM | POA: Diagnosis not present

## 2016-03-30 DIAGNOSIS — Z1211 Encounter for screening for malignant neoplasm of colon: Secondary | ICD-10-CM | POA: Diagnosis not present

## 2016-03-30 DIAGNOSIS — M5136 Other intervertebral disc degeneration, lumbar region: Secondary | ICD-10-CM

## 2016-03-30 DIAGNOSIS — J301 Allergic rhinitis due to pollen: Secondary | ICD-10-CM | POA: Diagnosis not present

## 2016-03-30 DIAGNOSIS — E559 Vitamin D deficiency, unspecified: Secondary | ICD-10-CM | POA: Diagnosis not present

## 2016-03-30 DIAGNOSIS — J309 Allergic rhinitis, unspecified: Secondary | ICD-10-CM | POA: Insufficient documentation

## 2016-03-30 DIAGNOSIS — N400 Enlarged prostate without lower urinary tract symptoms: Secondary | ICD-10-CM

## 2016-03-30 LAB — LIPID PANEL
HDL: 65 mg/dL (ref 35–70)
LDL Cholesterol: 72 mg/dL

## 2016-03-30 NOTE — Progress Notes (Signed)
Subjective:    Patient ID: Paul Parks, male    DOB: 1936-01-17, 80 y.o.   MRN: [id number]  HPI Pt here for follow up and management of chronic medical problems which includes hyperlipidemia. He is taking medications regularly.The patient has a long history of labyrinthitis. He has been to the ear nose and throat specialist in the past about this. He continues to have problems with this. He is due to return an FOBT and get his lab work today. The patient continues to have dizziness which he has had for years. This seems to be worse in the past few months. He had a bad case of the flu this winter. He says he is having the dizziness issues daily and it seems to affect his balance and his ability to walk. He has not had any nausea or headaches with this. He describes the balance issues and is just being wobbly when he walks. He sees Dr. Jeffie Pollock yearly for his prostate and last saw him in December. He has a history of osteoarthritis and low back pain and this is been stable recently he has had a CT scan of his head in the past several months and we will give him a copy of this report. He otherwise denies chest pain shortness of breath chest tightness trouble swallowing heartburn indigestion nausea vomiting diarrhea or blood in the stool. He has some urinary frequency and the urologist is aware but has not had any burning or blood in the urine. He will get lab work today and will be given an FOBT to return.      Patient Active Problem List   Diagnosis Date Noted  . Allergic rhinitis 03/30/2016  . Glaucoma 02/18/2015  . Thrombocytopenia ([place]) 09/24/2014  . Degenerative disc disease, lumbar 10/08/2013  . BPH (benign prostatic hypertrophy) 05/30/2013  . Labyrinthitis 05/30/2013  . Hyperlipemia 03/12/2013  . Personal history of colonic polyps - adenomas 04/24/2008   Outpatient Encounter Prescriptions as of 03/30/2016  Medication Sig  . aspirin 81 MG tablet Take 81 mg by mouth daily.  .  cholecalciferol (VITAMIN D) 1000 UNITS tablet Take 2,000 Units by mouth daily.   Marland Kitchen dutasteride (AVODART) 0.5 MG capsule Take 0.5 mg by mouth daily. qod  . fluticasone (FLONASE) 50 MCG/ACT nasal spray INSTILL 1 SPRAY IN EACH NOSTRIL DAILY (Patient taking differently: INSTILL 1 SPRAY IN EACH NOSTRIL DAILY prn)  . Multiple Vitamin (MULTIVITAMIN WITH MINERALS) TABS tablet Take 1 tablet by mouth daily.  . rosuvastatin (CRESTOR) 10 MG tablet Take 1 tablet (10 mg total) by mouth daily. 1/2 qd  . TRAVATAN Z 0.004 % SOLN ophthalmic solution   . meclizine (ANTIVERT) 12.5 MG tablet TAKE 1 TABLET 4 TIMES A DAY WITH MEALS AND AT BEDTIME FOR DIZZINESS (Patient not taking: Reported on 03/30/2016)  . meloxicam (MOBIC) 15 MG tablet TAKE 1/2 TO 1 TABLET BY MOUTH EVERY DAY AFTER a meal. (Patient not taking: Reported on 03/30/2016)  . [DISCONTINUED] fluticasone (FLONASE) 50 MCG/ACT nasal spray INSTILL 1 SPRAY IN EACH NOSTRIL DAILY   No facility-administered encounter medications on file as of 03/30/2016.     Review of Systems  Constitutional: Negative.   HENT: Negative.   Eyes: Negative.   Respiratory: Negative.   Cardiovascular: Negative.   Gastrointestinal: Negative.   Endocrine: Negative.   Genitourinary: Negative.   Musculoskeletal: Negative.   Skin: Negative.   Allergic/Immunologic: Negative.   Neurological: Positive for dizziness.  Hematological: Negative.   Psychiatric/Behavioral: Negative.  Objective:   Physical Exam  Constitutional: He is oriented to person, place, and time. He appears well-developed and well-nourished. No distress.  HENT:  Head: Normocephalic and atraumatic.  Right Ear: External ear normal.  Left Ear: External ear normal.  Mouth/Throat: Oropharynx is clear and moist. No oropharyngeal exudate.  Nasal congestion bilaterally  Eyes: Conjunctivae and EOM are normal. Pupils are equal, round, and reactive to light. Right eye exhibits no discharge. Left eye exhibits no  discharge. No scleral icterus.  Neck: Normal range of motion. Neck supple. No thyromegaly present.  No bruits or thyromegaly  Cardiovascular: Normal rate, regular rhythm, normal heart sounds and intact distal pulses.  Exam reveals no gallop and no friction rub.   No murmur heard. Heart is regular at 72/m  Pulmonary/Chest: Effort normal and breath sounds normal. No respiratory distress. He has no wheezes. He has no rales. He exhibits no tenderness.  Clear anteriorly and posteriorly and no axillary adenopathy  Abdominal: Soft. Bowel sounds are normal. He exhibits no mass. There is no tenderness. There is no rebound and no guarding.  No liver or spleen enlargement no abdominal tenderness or bruits  Genitourinary: Penis normal.  Patient was checked today and there were no inguinal hernias palpable. He was checked because he is complaining of some left lower quadrant pain. He sees the urologist regularly and December.  Musculoskeletal: Normal range of motion. He exhibits no edema or tenderness.  Lymphadenopathy:    He has no cervical adenopathy.  Neurological: He is alert and oriented to person, place, and time. He has normal reflexes. No cranial nerve deficit.  Skin: Skin is warm and dry. No rash noted.  Psychiatric: He has a normal mood and affect. His behavior is normal. Judgment and thought content normal.  Nursing note and vitals reviewed.  BP 98/58 mmHg  Pulse 62  Temp(Src) 97.8 F (36.6 C) (Oral)  Ht _0  (1.753 m)  Wt 187 lb (84.823 kg)  BMI 27.60 kg/m2        Assessment & Plan:  1. Hyperlipemia -Continue current treatment pending results of lab work - BMP8+EGFR - CBC with Differential/Platelet - Hepatic function panel - NMR, lipoprofile  2. Vitamin D deficiency -Continue current treatment pending results of lab work - CBC with Differential/Platelet - VITAMIN D 25 Hydroxy (Vit-D Deficiency, Fractures)  3. BPH (benign prostatic hypertrophy) -Continue to follow-up  regularly with the urologist in December - CBC with Differential/Platelet  4. Thrombocytopenia ([place]) -The patient has had no bleeding issues and we will continue to monitor for his low platelet count - CBC with Differential/Platelet  5. Special screening for malignant neoplasms, colon - Fecal occult blood, imunochemical; Future  6. Allergic rhinitis due to pollen -Continue current treatment  7. Degenerative disc disease, lumbar -This is been stable recently and he will be careful about lifting pushing pulling heavy objects.  8. Labyrinthitis, bilateral -Because of the persistence of this dizziness and now it seems to be affecting his gait we will do a referral to ear nose and throat specialist at [facility] for further evaluation. He was taking any lab work done at this office with him and any recent CT scans or x-ray reports of his head.  Patient Instructions                       Medicare Annual Wellness Visit  [facility] and the medical providers at [facility] strive to bring you the best medical care.  In doing so we not only want to address your current medical conditions and concerns but also to detect new conditions early and prevent illness, disease and health-related problems.    Medicare offers a yearly Wellness Visit which allows our clinical staff to assess your need for preventative services including immunizations, lifestyle education, counseling to decrease risk of preventable diseases and screening for fall risk and other medical concerns.    This visit is provided free of charge (no copay) for all Medicare recipients. The clinical pharmacists at [facility] have begun to conduct these Wellness Visits which will also include a thorough review of all your medications.    As you primary medical provider recommend that you make an appointment for your Annual Wellness Visit if you have not done so already this year.  You  may set up this appointment before you leave today or you may call back [phone]) and schedule an appointment.  Please make sure when you call that you mention that you are scheduling your Annual Wellness Visit with the clinical pharmacist so that the appointment may be made for the proper length of time.     Continue current medications. Continue good therapeutic lifestyle changes which include good diet and exercise. Fall precautions discussed with patient. If an FOBT was given today- please return it to our front desk. If you are over 59 years old - you may need Prevnar 52 or the adult Pneumonia vaccine.  **Flu shots are available--- please call and schedule a FLU-CLINIC appointment**  After your visit with [name] today you will receive a survey in the mail or online from [employer] regarding your care with [name]. Please take a moment to fill this out. Your feedback is very important to [name] as you can help [name] better understand your patient needs as well as improve your experience and satisfaction. WE CARE ABOUT YOU!!!   We will make arrangements for you to see an ear nose and throat specialist regarding the dizziness and the gait instability at [facility] Please take a copy of the lab work were doing with you today and any x-ray reports to benefit them further for evaluating you Please do not do any climbing   Arrie Senate MD

## 2016-03-30 NOTE — Patient Instructions (Addendum)
Medicare Annual Wellness Visit  [facility] and the medical providers at [facility] strive to bring you the best medical care.  In doing so we not only want to address your current medical conditions and concerns but also to detect new conditions early and prevent illness, disease and health-related problems.    Medicare offers a yearly Wellness Visit which allows our clinical staff to assess your need for preventative services including immunizations, lifestyle education, counseling to decrease risk of preventable diseases and screening for fall risk and other medical concerns.    This visit is provided free of charge (no copay) for all Medicare recipients. The clinical pharmacists at [facility] have begun to conduct these Wellness Visits which will also include a thorough review of all your medications.    As you primary medical provider recommend that you make an appointment for your Annual Wellness Visit if you have not done so already this year.  You may set up this appointment before you leave today or you may call back [phone] and schedule an appointment.  Please make sure when you call that you mention that you are scheduling your Annual Wellness Visit with the clinical pharmacist so that the appointment may be made for the proper length of time.     Continue current medications. Continue good therapeutic lifestyle changes which include good diet and exercise. Fall precautions discussed with patient. If an FOBT was given today- please return it to our front desk. If you are over 61 years old - you may need Prevnar 16 or the adult Pneumonia vaccine.  **Flu shots are available--- please call and schedule a FLU-CLINIC appointment**  After your visit with [name] today you will receive a survey in the mail or online from [employer] regarding your care with [name]. Please take a moment to fill this out. Your feedback is very  important to [name] as you can help [name] better understand your patient needs as well as improve your experience and satisfaction. WE CARE ABOUT YOU!!!   We will make arrangements for you to see an ear nose and throat specialist regarding the dizziness and the gait instability at [facility] Please take a copy of the lab work were doing with you today and any x-ray reports to benefit them further for evaluating you Please do not do any climbing

## 2016-03-30 NOTE — Addendum Note (Signed)
Addended by: Zannie Cove on: 03/30/2016 09:32 AM   Modules accepted: Orders, SmartSet

## 2016-03-31 LAB — CBC WITH DIFFERENTIAL/PLATELET
BASOS: 0 %
Basophils Absolute: 0 10*3/uL (ref 0.0–0.2)
EOS (ABSOLUTE): 0.1 10*3/uL (ref 0.0–0.4)
Eos: 2 %
HEMOGLOBIN: 14.4 g/dL (ref 12.6–17.7)
Hematocrit: 43.4 % (ref 37.5–51.0)
IMMATURE GRANS (ABS): 0 10*3/uL (ref 0.0–0.1)
Immature Granulocytes: 0 %
LYMPHS: 33 %
Lymphocytes Absolute: 2.3 10*3/uL (ref 0.7–3.1)
MCH: 30.1 pg (ref 26.6–33.0)
MCHC: 33.2 g/dL (ref 31.5–35.7)
MCV: 91 fL (ref 79–97)
MONOCYTES: 5 %
Monocytes Absolute: 0.4 10*3/uL (ref 0.1–0.9)
NEUTROS ABS: 4.1 10*3/uL (ref 1.4–7.0)
Neutrophils: 60 %
PLATELETS: 155 10*3/uL (ref 150–379)
RBC: 4.78 x10E6/uL (ref 4.14–5.80)
RDW: 13.5 % (ref 12.3–15.4)
WBC: 6.8 10*3/uL (ref 3.4–10.8)

## 2016-03-31 LAB — BMP8+EGFR
BUN/Creatinine Ratio: 12 (ref 10–24)
BUN: 13 mg/dL (ref 8–27)
CALCIUM: 9.4 mg/dL (ref 8.6–10.2)
CO2: 26 mmol/L (ref 18–29)
Chloride: 99 mmol/L (ref 96–106)
Creatinine, Ser: 1.09 mg/dL (ref 0.76–1.27)
GFR, EST AFRICAN AMERICAN: 75 mL/min/{1.73_m2} (ref >59)
GFR, EST NON AFRICAN AMERICAN: 65 mL/min/{1.73_m2} (ref >59)
Glucose: 94 mg/dL (ref 65–99)
POTASSIUM: 4.6 mmol/L (ref 3.5–5.2)
Sodium: 139 mmol/L (ref 134–144)

## 2016-03-31 LAB — NMR, LIPOPROFILE
Cholesterol: 158 mg/dL (ref 100–199)
HDL Cholesterol by NMR: 65 mg/dL (ref >39)
HDL Particle Number: 34.9 umol/L (ref >=30.5)
LDL Particle Number: 989 nmol/L (ref <1000)
LDL SIZE: 20.8 nm (ref >20.5)
LDL-C: 72 mg/dL (ref 0–99)
SMALL LDL PARTICLE NUMBER: 247 nmol/L (ref <=527)
TRIGLYCERIDES BY NMR: 103 mg/dL (ref 0–149)

## 2016-03-31 LAB — VITAMIN D 25 HYDROXY (VIT D DEFICIENCY, FRACTURES): Vit D, 25-Hydroxy: 40.9 ng/mL (ref 30.0–100.0)

## 2016-03-31 LAB — HEPATIC FUNCTION PANEL
ALK PHOS: 57 IU/L (ref 39–117)
ALT: 18 IU/L (ref 0–44)
AST: 22 IU/L (ref 0–40)
Albumin: 4.4 g/dL (ref 3.5–4.8)
BILIRUBIN TOTAL: 0.6 mg/dL (ref 0.0–1.2)
BILIRUBIN, DIRECT: 0.17 mg/dL (ref 0.00–0.40)
Total Protein: 6.6 g/dL (ref 6.0–8.5)

## 2016-04-20 ENCOUNTER — Telehealth: Payer: Self-pay | Admitting: Family Medicine

## 2016-04-22 NOTE — Telephone Encounter (Signed)
Pt aware [facility] will have to request records from [facility]

## 2016-05-09 ENCOUNTER — Other Ambulatory Visit: Payer: Self-pay | Admitting: Family Medicine

## 2016-05-26 ENCOUNTER — Encounter: Payer: Self-pay | Admitting: Family Medicine

## 2016-06-23 ENCOUNTER — Ambulatory Visit (INDEPENDENT_AMBULATORY_CARE_PROVIDER_SITE_OTHER): Payer: Medicare Other | Admitting: Pharmacist

## 2016-06-23 ENCOUNTER — Encounter: Payer: Self-pay | Admitting: Pharmacist

## 2016-06-23 VITALS — BP 106/62 | HR 62 | Ht 70.0 in | Wt 188.0 lb

## 2016-06-23 DIAGNOSIS — Z0000 Encounter for general adult medical examination without abnormal findings: Secondary | ICD-10-CM | POA: Diagnosis not present

## 2016-06-23 NOTE — Progress Notes (Signed)
Subjective:   Paul Parks is a 80 y.o. male who presents for a subsequent Medicare Annual Wellness Visit. Paul Parks lives with his wife in [place], [place].  He is retired.   He reports that he feels well and his only health concern is his balance.  He experienced dizziness and weakness in December 2016.  He took meclizine which helped only a little.  He has over the last 3 months had a work up by Engineer, agricultural.  Both felt that patient has benign paroxysmal positional vertigo which was resolved by the time they examined patient.  He is scheduled to start PT for strength ing building in his lower extremities.  Review of Systems  Review of Systems  Constitutional: Negative.   HENT: Negative.   Eyes: Negative.   Respiratory: Negative.   Cardiovascular: Negative.   Gastrointestinal: Negative.   Musculoskeletal: Positive for joint pain.  Skin: Negative.   Neurological: Negative.   Endo/Heme/Allergies: Negative.   Psychiatric/Behavioral: Negative.       Current Medications (verified) Outpatient Encounter Prescriptions as of 06/23/2016  Medication Sig  . aspirin 81 MG tablet Take 81 mg by mouth daily.  . cholecalciferol (VITAMIN D) 1000 UNITS tablet Take 2,000 Units by mouth daily.   Marland Kitchen dutasteride (AVODART) 0.5 MG capsule Take 0.5 mg by mouth daily. qod  . fluticasone (FLONASE) 50 MCG/ACT nasal spray INSTILL 1 SPRAY IN EACH NOSTRIL DAILY (Patient taking differently: INSTILL 1 SPRAY IN EACH NOSTRIL DAILY prn)  . meloxicam (MOBIC) 15 MG tablet TAKE 1/2 TO 1 TABLET BY MOUTH EVERY DAY AFTER a meal.  . Multiple Vitamin (MULTIVITAMIN WITH MINERALS) TABS tablet Take 1 tablet by mouth daily.  . rosuvastatin (CRESTOR) 10 MG tablet Take 1 tablet (10 mg total) by mouth daily. 1/2 qd (Patient taking differently: Take 5 mg by mouth daily. 1/2 qd)  . meclizine (ANTIVERT) 12.5 MG tablet TAKE 1 TABLET 4 TIMES A DAY WITH MEALS AND AT BEDTIME FOR DIZZINESS (Patient not taking: Reported  on 03/30/2016)  . TRAVATAN Z 0.004 % SOLN ophthalmic solution Place 1 drop into both eyes at bedtime.   . [DISCONTINUED] fluticasone (FLONASE) 50 MCG/ACT nasal spray INSTILL 1 SPRAY IN EACH NOSTRIL DAILY   No facility-administered encounter medications on file as of 06/23/2016.    Allergies (verified) Sulfa antibiotics; Proscar; and Vesicare   History: Past Medical History  Diagnosis Date  . DDD (degenerative disc disease)   . BPH (benign prostatic hyperplasia)   . Hyperlipidemia   . Vitamin D deficiency   . Personal history of colonic polyps   . Glaucoma   . Allergy   . Cataract    Past Surgical History  Procedure Laterality Date  . Treatment fistula anal    . Colon surgery      polyps   . Colonoscopy    . Eye surgery     Family History  Problem Relation Age of Onset  . Heart disease Mother   . Diabetes Mother     diet controlled  . Heart disease Brother   . Emphysema Brother     smoker  . Colon cancer Neg Hx   . Heart disease Father   . Emphysema Father     smoker  . Cancer Sister     brain  . Emphysema Sister     smoker  . Lumbar disc disease Sister    Social History   Occupational History  . Not on file.   Social History Main Topics  .  Smoking status: Never Smoker   . Smokeless tobacco: Never Used  . Alcohol Use: 0.0 oz/week    0 Standard drinks or equivalent per week     Comment: every 2 months 1-2 beers                            . Drug Use: No  . Sexual Activity: Yes    Do you feel safe at home?  No Are there smokers in your home (other than you)? No  Dietary issues and exercise activities discussed: Current Exercise Habits: The patient does not participate in regular exercise at present (plan to start PT and then start gym)  Current Dietary habits:  Tried to avoid fried foods.  Eats oatmeal and orange in am;  Lunch - sandwich Supper - meat and vegetables.   Cardiac Risk Factors include: advanced age (>60men, >52 women);dyslipidemia;male  gender;sedentary lifestyle  Objective:    Today's Vitals   06/23/16 1217  BP: 106/62  Pulse: 62  Height: 5\' 10"  (1.778 m)  Weight: 188 lb (85.276 kg)  PainSc: 0-No pain   Body mass index is 26.98 kg/(m^2).   Activities of Daily Living In your present state of health, do you have any difficulty performing the following activities: 06/23/2016  Hearing? N  Vision? N  Difficulty concentrating or making decisions? N  Walking or climbing stairs? Y  Dressing or bathing? N  Doing errands, shopping? N  Preparing Food and eating ? N  Using the Toilet? N  In the past six months, have you accidently leaked urine? N  Do you have problems with loss of bowel control? N  Managing your Medications? N  Managing your Finances? N  Housekeeping or managing your Housekeeping? N     Depression Screen PHQ 2/9 Scores 06/23/2016 03/30/2016 11/10/2015 06/29/2015  PHQ - 2 Score 0 0 0 0     Fall Risk Fall Risk  06/23/2016 03/30/2016 11/10/2015 06/29/2015 06/18/2015  Falls in the past year? No No No No No  Number falls in past yr: - - - - -  Risk for fall due to : - - - - -    Cognitive Function: MMSE - Mini Mental State Exam 06/23/2016 02/18/2015  Orientation to time 5 5  Orientation to Place 5 5  Registration 3 3  Attention/ Calculation 3 3  Recall 3 3  Language- name 2 objects 2 2  Language- repeat 1 1  Language- follow 3 step command 3 3  Language- read & follow direction 1 1  Write a sentence 1 1  Copy design 1 1  Total score 28 28    Immunizations and Health Maintenance Immunization History  Administered Date(s) Administered  . Influenza, High Dose Seasonal PF 10/13/2015  . Influenza,inj,Quad PF,36+ Mos 10/08/2013, 10/21/2014  . Pneumococcal Conjugate-13 02/12/2014  . Pneumococcal Polysaccharide-23 03/19/2009  . Tdap 11/08/2011  . Zoster 01/22/2007   There are no preventive care reminders to display for this patient.  Patient Care Team: Chipper Herb, MD as PCP - General (Family  Medicine) Rondel Oh, MD as Referring Physician (Ophthalmology) Irine Seal, MD as Attending Physician (Urology) Garry Heater, MD as Consulting Physician (Dermatology) Philomena Doheny, MD as Consulting Physician (Plastic Surgery)  Indicate any recent Medical Services you may have received from other than [facility] providers in the past year (date may be approximate).    Assessment:    Annual Wellness Visit  Screening Tests Health Maintenance  Topic Date Due  . INFLUENZA VACCINE  07/05/2016  . COLONOSCOPY  02/06/2017  . TETANUS/TDAP  11/07/2021  . ZOSTAVAX  Completed  . PNA vac Low Risk Adult  Completed        Plan:   During the course of the visit Severin was educated and counseled about the following appropriate screening and preventive services:   Vaccines to include Pneumoccal, Influenza,  Td, Zostavax - patient is UTD on all vaccines  Colorectal cancer screening - colonoscoy q3 years - due next 02/2017  Cardiovascular disease screening - US of lower ext 03/2013  BP at goal today  Lipids at goal with current statin treatment  Diabetes screening - UTD  Glaucoma screening / Eye Exam - UTD  Nutrition counseling - continue to limit sodium intake and eat at least 5 fruits / vegetables daily  Prostate cancer screening - UTD  Advanced Directives - needed, information packet provided and discussed  Physical Activity - continue with plan to start PT for strength building    Patient Instructions (the written plan) were given to the patient.   Cherre Robins, [facility]   06/23/2016

## 2016-06-23 NOTE — Patient Instructions (Addendum)
  Mr. Grahan , Thank you for taking time to come for your Medicare Wellness Visit. I appreciate your ongoing commitment to your health goals. Please review the following plan we discussed and let me know if I can assist you in the future.   These are the goals we discussed:  Continue with plan with physical therapy for strengthening Continue to eat healthy  Increase non-starchy vegetables - carrots, green bean, squash, zucchini, tomatoes, onions, peppers, spinach and other green leafy vegetables, cabbage, lettuce, cucumbers, asparagus, okra (not fried), eggplant Limit sugar and processed foods (cakes, cookies, ice cream, crackers and chips) Increase fresh fruit but limit serving sizes 1/2 cup or about the size of tennis or baseball Limit red meat to no more than 1-2 times per week (serving size about the size of your palm) Choose whole grains / lean proteins - whole wheat bread, quinoa, whole grain rice (1/2 cup), fish, chicken, [place] Avoid sugar and calorie containing beverages - soda, sweet tea and juice.  Choose water or unsweetened tea instead.   This is a list of the screening recommended for you and due dates:  Health Maintenance  Topic Date Due  . Flu Shot  07/05/2016  . Colon Cancer Screening  02/06/2017  . Tetanus Vaccine  11/07/2021  . Shingles Vaccine  Completed  . Pneumonia vaccines  Completed

## 2016-08-16 ENCOUNTER — Encounter: Payer: Self-pay | Admitting: Family Medicine

## 2016-08-16 ENCOUNTER — Ambulatory Visit (INDEPENDENT_AMBULATORY_CARE_PROVIDER_SITE_OTHER): Payer: Medicare Other | Admitting: Family Medicine

## 2016-08-16 VITALS — BP 91/54 | HR 61 | Temp 98.0°F | Ht 70.0 in | Wt 186.0 lb

## 2016-08-16 DIAGNOSIS — H8303 Labyrinthitis, bilateral: Secondary | ICD-10-CM | POA: Diagnosis not present

## 2016-08-16 DIAGNOSIS — E559 Vitamin D deficiency, unspecified: Secondary | ICD-10-CM | POA: Diagnosis not present

## 2016-08-16 DIAGNOSIS — G8929 Other chronic pain: Secondary | ICD-10-CM

## 2016-08-16 DIAGNOSIS — M545 Low back pain: Secondary | ICD-10-CM

## 2016-08-16 DIAGNOSIS — E785 Hyperlipidemia, unspecified: Secondary | ICD-10-CM

## 2016-08-16 DIAGNOSIS — D696 Thrombocytopenia, unspecified: Secondary | ICD-10-CM

## 2016-08-16 DIAGNOSIS — N400 Enlarged prostate without lower urinary tract symptoms: Secondary | ICD-10-CM

## 2016-08-16 NOTE — Patient Instructions (Addendum)
Medicare Annual Wellness Visit  [facility] and the medical providers at [facility] strive to bring you the best medical care.  In doing so we not only want to address your current medical conditions and concerns but also to detect new conditions early and prevent illness, disease and health-related problems.    Medicare offers a yearly Wellness Visit which allows our clinical staff to assess your need for preventative services including immunizations, lifestyle education, counseling to decrease risk of preventable diseases and screening for fall risk and other medical concerns.    This visit is provided free of charge (no copay) for all Medicare recipients. The clinical pharmacists at [facility] have begun to conduct these Wellness Visits which will also include a thorough review of all your medications.    As you primary medical provider recommend that you make an appointment for your Annual Wellness Visit if you have not done so already this year.  You may set up this appointment before you leave today or you may call back [phone] and schedule an appointment.  Please make sure when you call that you mention that you are scheduling your Annual Wellness Visit with the clinical pharmacist so that the appointment may be made for the proper length of time.     Continue current medications. Continue good therapeutic lifestyle changes which include good diet and exercise. Fall precautions discussed with patient. If an FOBT was given today- please return it to our front desk. If you are over 74 years old - you may need Prevnar 33 or the adult Pneumonia vaccine.  **Flu shots are available--- please call and schedule a FLU-CLINIC appointment**  After your visit with [name] today you will receive a survey in the mail or online from [employer] regarding your care with [name]. Please take a moment to fill this out. Your feedback is very  important to [name] as you can help [name] better understand your patient needs as well as improve your experience and satisfaction. WE CARE ABOUT YOU!!!   Continue to follow-up with ear nose and throat specialist as recommended Try wearing some good commercial support hose and put these on the first thing with getting out of the bed in the morning Avoid use of the fan especially in the wintertime as this can dry you out more and make you've more sensitive to your environment and increase sure allergies and ultimately even affect the inner ear Do not forget to get your flu shot

## 2016-08-16 NOTE — Progress Notes (Signed)
Subjective:    Patient ID: Paul Parks, male    DOB: [birth year], 80 y.o.   MRN: [id number]  HPI Pt here for follow up and management of chronic medical problems which include: Hyperlipidemia. Pt is taking medications regularly. This patient is known to me for many years. He has a history of hyperlipidemia. Has a history also of months issues and vertigo type symptoms. He is seen the neurologist for this. He will get lab work today. He will come back for his flu shot. The patient continues to have dizziness and balance issues. He is been seeing a physician at [facility]. These problems are always worse late in the fall and early winter. The doctor there has told him to come when he is having one of these spells and he can better evaluate him at that time. He also continues to be followed by the urologist because of urinary frequency and sees him in early January on a yearly basis. He is getting some nerve stimulation and this has helped with his frequency issues. The patient denies any chest pain or pressure with walking and exercise. He denies any shortness of breath. He's not having any problems with his GI tract including nausea vomiting diarrhea blood in the stool or black tarry bowel movements. He does have a family history of colon cancer and his last colonoscopy was in March 2015 so he will need another colonoscopy in March 2020 and he is aware of this. He continues to have the frequency issues with voiding but no burning and he will see the urologist in January. He also takes meloxicam for his chronic tach pain and arthritis.    Patient Active Problem List   Diagnosis Date Noted  . Allergic rhinitis 03/30/2016  . Glaucoma 02/18/2015  . Thrombocytopenia ([place]) 09/24/2014  . Degenerative disc disease, lumbar 10/08/2013  . BPH (benign prostatic hypertrophy) 05/30/2013  . Labyrinthitis 05/30/2013  . Hyperlipemia 03/12/2013  . Personal history of colonic  polyps - adenomas 04/24/2008   Outpatient Encounter Prescriptions as of 08/16/2016  Medication Sig  . aspirin 81 MG tablet Take 81 mg by mouth daily.  . cholecalciferol (VITAMIN D) 1000 UNITS tablet Take 2,000 Units by mouth daily.   Marland Kitchen dutasteride (AVODART) 0.5 MG capsule Take 0.5 mg by mouth daily. qod  . fluticasone (FLONASE) 50 MCG/ACT nasal spray INSTILL 1 SPRAY IN EACH NOSTRIL DAILY (Patient taking differently: INSTILL 1 SPRAY IN EACH NOSTRIL DAILY prn)  . meclizine (ANTIVERT) 12.5 MG tablet TAKE 1 TABLET 4 TIMES A DAY WITH MEALS AND AT BEDTIME FOR DIZZINESS  . meloxicam (MOBIC) 15 MG tablet TAKE 1/2 TO 1 TABLET BY MOUTH EVERY DAY AFTER a meal.  . mirabegron ER (MYRBETRIQ) 25 MG TB24 tablet Take 25 mg by mouth daily.  . Multiple Vitamin (MULTIVITAMIN WITH MINERALS) TABS tablet Take 1 tablet by mouth daily.  . rosuvastatin (CRESTOR) 10 MG tablet Take 1 tablet (10 mg total) by mouth daily. 1/2 qd (Patient taking differently: Take 5 mg by mouth daily. 1/2 qd)  . TRAVATAN Z 0.004 % SOLN ophthalmic solution Place 1 drop into both eyes at bedtime.    No facility-administered encounter medications on file as of 08/16/2016.      Review of Systems  Constitutional: Negative.   HENT: Negative.   Eyes: Negative.   Respiratory: Negative.   Cardiovascular: Negative.   Gastrointestinal: Negative.   Endocrine: Negative.   Genitourinary: Negative.   Musculoskeletal: Negative.  Negative for back  pain.  Skin: Negative.   Allergic/Immunologic: Negative.   Neurological: Negative.        Balance issues   Hematological: Negative.   Psychiatric/Behavioral: Negative.        Objective:   Physical Exam  Constitutional: He is oriented to person, place, and time. He appears well-developed and well-nourished. No distress.  HENT:  Head: Normocephalic and atraumatic.  Right Ear: External ear normal.  Left Ear: External ear normal.  Mouth/Throat: Oropharynx is clear and moist. No oropharyngeal exudate.   Nasal turbinate congestion left greater than right but throat is clear and there is no anterior cervical adenopathy  Eyes: Conjunctivae and EOM are normal. Pupils are equal, round, and reactive to light. Right eye exhibits no discharge. Left eye exhibits no discharge. No scleral icterus.  Neck: Normal range of motion. Neck supple. No thyromegaly present.  No carotid bruits  Cardiovascular: Normal rate, regular rhythm, normal heart sounds and intact distal pulses.   No murmur heard. At 60/m  Pulmonary/Chest: Effort normal and breath sounds normal. No respiratory distress. He has no wheezes. He has no rales. He exhibits no tenderness.  Clear anteriorly and posteriorly and no axillary adenopathy  Abdominal: Soft. Bowel sounds are normal. He exhibits no mass. There is no tenderness. There is no rebound and no guarding.  No abdominal tenderness liver or spleen enlargement or bruits  Genitourinary:  Genitourinary Comments: This exam is deferred to his urologist whom he sees yearly.  Musculoskeletal: Normal range of motion. He exhibits no edema.  Lymphadenopathy:    He has no cervical adenopathy.  Neurological: He is alert and oriented to person, place, and time. He has normal reflexes. No cranial nerve deficit.  Skin: Skin is warm and dry. No rash noted.  Psychiatric: He has a normal mood and affect. His behavior is normal. Judgment and thought content normal.  Nursing note and vitals reviewed.   BP (!) 91/54 (BP Location: Left Arm)   Pulse 61   Temp 98 F (36.7 C) (Oral)   Ht _0  (1.778 m)   Wt 186 lb (84.4 kg)   BMI 26.69 kg/m          Assessment & Plan:  1. Hyperlipemia -Continue with current treatment pending results of lab work. Continue with therapeutic lifestyle changes - BMP8+EGFR - CBC with Differential/Platelet - Hepatic function panel - NMR, lipoprofile  2. Vitamin D deficiency -Continue with current treatment pending results of lab work - CBC with  Differential/Platelet - VITAMIN D 25 Hydroxy (Vit-D Deficiency, Fractures)  3. BPH (benign prostatic hypertrophy) -This is done by the urologist yearly in January - CBC with Differential/Platelet  4. Thrombocytopenia ([facility]) -No bleeding issues - CBC with Differential/Platelet  5. Chronic low back pain -Continue to walk and exercise regularly and avoid falling  6. Labyrinthitis, bilateral -Follow-up with ear nose and throat specialist at [facility] as they have requested  Patient Instructions                       Medicare Annual Wellness Visit  [facility] and the medical providers at [facility] strive to bring you the best medical care.  In doing so we not only want to address your current medical conditions and concerns but also to detect new conditions early and prevent illness, disease and health-related problems.    Medicare offers a yearly Wellness Visit which allows our clinical staff to assess your need for preventative services  including immunizations, lifestyle education, counseling to decrease risk of preventable diseases and screening for fall risk and other medical concerns.    This visit is provided free of charge (no copay) for all Medicare recipients. The clinical pharmacists at [facility] have begun to conduct these Wellness Visits which will also include a thorough review of all your medications.    As you primary medical provider recommend that you make an appointment for your Annual Wellness Visit if you have not done so already this year.  You may set up this appointment before you leave today or you may call back ([phone]) and schedule an appointment.  Please make sure when you call that you mention that you are scheduling your Annual Wellness Visit with the clinical pharmacist so that the appointment may be made for the proper length of time.     Continue current  medications. Continue good therapeutic lifestyle changes which include good diet and exercise. Fall precautions discussed with patient. If an FOBT was given today- please return it to our front desk. If you are over 34 years old - you may need Prevnar 97 or the adult Pneumonia vaccine.  **Flu shots are available--- please call and schedule a FLU-CLINIC appointment**  After your visit with [name] today you will receive a survey in the mail or online from [employer] regarding your care with [name]. Please take a moment to fill this out. Your feedback is very important to [name] as you can help [name] better understand your patient needs as well as improve your experience and satisfaction. WE CARE ABOUT YOU!!!   Continue to follow-up with ear nose and throat specialist as recommended Try wearing some good commercial support hose and put these on the first thing with getting out of the bed in the morning Avoid use of the fan especially in the wintertime as this can dry you out more and make you've more sensitive to your environment and increase sure allergies and ultimately even affect the inner ear Do not forget to get your flu shot   Arrie Senate MD

## 2016-08-17 ENCOUNTER — Telehealth: Payer: Self-pay | Admitting: Family Medicine

## 2016-08-17 LAB — NMR, LIPOPROFILE
CHOLESTEROL: 180 mg/dL (ref 100–199)
HDL CHOLESTEROL BY NMR: 65 mg/dL (ref >39)
HDL Particle Number: 32.5 umol/L (ref >=30.5)
LDL Particle Number: 1124 nmol/L — ABNORMAL HIGH (ref <1000)
LDL SIZE: 21 nm (ref >20.5)
LDL-C: 95 mg/dL (ref 0–99)
LP-IR Score: <25 (ref <=45)
SMALL LDL PARTICLE NUMBER: 296 nmol/L (ref <=527)
TRIGLYCERIDES BY NMR: 101 mg/dL (ref 0–149)

## 2016-08-17 LAB — CBC WITH DIFFERENTIAL/PLATELET
BASOS ABS: 0 10*3/uL (ref 0.0–0.2)
BASOS: 0 %
EOS (ABSOLUTE): 0.1 10*3/uL (ref 0.0–0.4)
Eos: 1 %
Hematocrit: 40.6 % (ref 37.5–51.0)
Hemoglobin: 13.8 g/dL (ref 12.6–17.7)
IMMATURE GRANS (ABS): 0 10*3/uL (ref 0.0–0.1)
IMMATURE GRANULOCYTES: 0 %
LYMPHS: 35 %
Lymphocytes Absolute: 1.9 10*3/uL (ref 0.7–3.1)
MCH: 30.4 pg (ref 26.6–33.0)
MCHC: 34 g/dL (ref 31.5–35.7)
MCV: 89 fL (ref 79–97)
MONOS ABS: 0.4 10*3/uL (ref 0.1–0.9)
Monocytes: 8 %
NEUTROS PCT: 56 %
Neutrophils Absolute: 3.1 10*3/uL (ref 1.4–7.0)
PLATELETS: 137 10*3/uL — AB (ref 150–379)
RBC: 4.54 x10E6/uL (ref 4.14–5.80)
RDW: 13.7 % (ref 12.3–15.4)
WBC: 5.6 10*3/uL (ref 3.4–10.8)

## 2016-08-17 LAB — HEPATIC FUNCTION PANEL
ALBUMIN: 4.5 g/dL (ref 3.5–4.8)
ALT: 15 IU/L (ref 0–44)
AST: 26 IU/L (ref 0–40)
Alkaline Phosphatase: 51 IU/L (ref 39–117)
BILIRUBIN TOTAL: 0.8 mg/dL (ref 0.0–1.2)
BILIRUBIN, DIRECT: 0.18 mg/dL (ref 0.00–0.40)
TOTAL PROTEIN: 6.6 g/dL (ref 6.0–8.5)

## 2016-08-17 LAB — BMP8+EGFR
BUN/Creatinine Ratio: 15 (ref 10–24)
BUN: 16 mg/dL (ref 8–27)
CALCIUM: 9.2 mg/dL (ref 8.6–10.2)
CHLORIDE: 99 mmol/L (ref 96–106)
CO2: 24 mmol/L (ref 18–29)
Creatinine, Ser: 1.08 mg/dL (ref 0.76–1.27)
GFR calc Af Amer: 76 mL/min/{1.73_m2} (ref >59)
GFR, EST NON AFRICAN AMERICAN: 65 mL/min/{1.73_m2} (ref >59)
GLUCOSE: 92 mg/dL (ref 65–99)
POTASSIUM: 4.5 mmol/L (ref 3.5–5.2)
SODIUM: 139 mmol/L (ref 134–144)

## 2016-08-17 LAB — VITAMIN D 25 HYDROXY (VIT D DEFICIENCY, FRACTURES): Vit D, 25-Hydroxy: 44.7 ng/mL (ref 30.0–100.0)

## 2016-08-23 ENCOUNTER — Encounter: Payer: Self-pay | Admitting: Nurse Practitioner

## 2016-08-23 ENCOUNTER — Ambulatory Visit: Payer: Medicare Other

## 2016-08-23 ENCOUNTER — Ambulatory Visit (INDEPENDENT_AMBULATORY_CARE_PROVIDER_SITE_OTHER): Payer: Medicare Other | Admitting: Nurse Practitioner

## 2016-08-23 VITALS — BP 115/67 | HR 62 | Temp 97.5°F | Ht 70.0 in | Wt 187.0 lb

## 2016-08-23 DIAGNOSIS — S6981XA Other specified injuries of right wrist, hand and finger(s), initial encounter: Secondary | ICD-10-CM | POA: Diagnosis not present

## 2016-08-23 NOTE — Progress Notes (Signed)
   Subjective:    Patient ID: Paul Parks, male    DOB: 1936/03/26, 80 y.o.   MRN: [id number]  HPI Patient comes in today c/o slamming truck door on right thumb yesterday afternoon. Throb all night long due to pressure under nail.    Review of Systems  Constitutional: Negative.   HENT: Negative.   Respiratory: Negative.   Cardiovascular: Negative.   Genitourinary: Negative.   Neurological: Negative.   Psychiatric/Behavioral: Negative.   All other systems reviewed and are negative.      Objective:   Physical Exam  Constitutional: He appears well-developed and well-nourished.  Cardiovascular: Normal rate, regular rhythm and normal heart sounds.   Pulmonary/Chest: Effort normal and breath sounds normal.  Neurological: He is alert.  Skin: Skin is warm.  echymosis under right thumb nail    BP 115/67   Pulse 62   Temp 97.5 F (36.4 C) (Oral)   Ht 5\' 10"  (1.778 m)   Wt 187 lb (84.8 kg)   BMI 26.83 kg/m   Hole in nail bed with cautery- patient tolerated well.     Assessment & Plan:   1. Nail bed injury, right, initial encounter    Keep clean and dry RTOprn  Mary-Margaret Hassell Done, FNP

## 2016-08-31 ENCOUNTER — Other Ambulatory Visit: Payer: Self-pay | Admitting: Family Medicine

## 2016-09-22 ENCOUNTER — Ambulatory Visit (INDEPENDENT_AMBULATORY_CARE_PROVIDER_SITE_OTHER): Payer: Medicare Other

## 2016-09-22 DIAGNOSIS — Z23 Encounter for immunization: Secondary | ICD-10-CM | POA: Diagnosis not present

## 2016-11-09 ENCOUNTER — Other Ambulatory Visit: Payer: Self-pay | Admitting: *Deleted

## 2016-11-09 MED ORDER — ROSUVASTATIN CALCIUM 10 MG PO TABS: 10.0000 mg | ORAL_TABLET | Freq: Every day | ORAL | 3 refills | Status: DC

## 2016-12-19 ENCOUNTER — Ambulatory Visit (INDEPENDENT_AMBULATORY_CARE_PROVIDER_SITE_OTHER): Payer: Medicare Other | Admitting: Family Medicine

## 2016-12-19 ENCOUNTER — Encounter: Payer: Self-pay | Admitting: Family Medicine

## 2016-12-19 VITALS — BP 115/62 | HR 57 | Temp 97.0°F | Ht 70.0 in | Wt 189.0 lb

## 2016-12-19 DIAGNOSIS — E559 Vitamin D deficiency, unspecified: Secondary | ICD-10-CM | POA: Diagnosis not present

## 2016-12-19 DIAGNOSIS — E7800 Pure hypercholesterolemia, unspecified: Secondary | ICD-10-CM | POA: Diagnosis not present

## 2016-12-19 DIAGNOSIS — D696 Thrombocytopenia, unspecified: Secondary | ICD-10-CM

## 2016-12-19 NOTE — Progress Notes (Signed)
Subjective:    Patient ID: Paul Parks, male    DOB: [birth year], 81 y.o.   MRN: [id number]  HPI Pt here for follow up and management of chronic medical problems which includes hyperlipidemia. He is taking medications regularly.The patient is doing well overall other than some residual cold and congestion symptoms. This is just like his wife. He and his family had some of this over the Christmas holidays and they have not completely gotten over this. He is due to get lab work today. The patient denies any chest pain or shortness of breath. He still has some head and chest congestion. The chest congestion is clear most of the time. He denies any trouble with his stomach including nausea vomiting diarrhea blood in the stool or black tarry but bowel movements. He is passing his water without problems and recently saw the urologist and is cleared for another year with a follow-up PSA at that time. He has had a history of colon polyps and he is thinking that his next colonoscopy is 5 years out which would put him in the spring of 2020   Patient Active Problem List   Diagnosis Date Noted  . Allergic rhinitis 03/30/2016  . Glaucoma 02/18/2015  . Thrombocytopenia ([place]) 09/24/2014  . Degenerative disc disease, lumbar 10/08/2013  . BPH (benign prostatic hypertrophy) 05/30/2013  . Labyrinthitis 05/30/2013  . Hyperlipemia 03/12/2013  . Personal history of colonic polyps - adenomas 04/24/2008   Outpatient Encounter Prescriptions as of 12/19/2016  Medication Sig  . aspirin 81 MG tablet Take 81 mg by mouth daily.  . cholecalciferol (VITAMIN D) 1000 UNITS tablet Take 2,000 Units by mouth daily.   Marland Kitchen dutasteride (AVODART) 0.5 MG capsule Take 0.5 mg by mouth daily. qod  . fluticasone (FLONASE) 50 MCG/ACT nasal spray INSTILL 1 SPRAY IN EACH NOSTRIL DAILY (Patient taking differently: INSTILL 1 SPRAY IN EACH NOSTRIL DAILY prn)  . meclizine (ANTIVERT) 12.5 MG tablet TAKE 1 TABLET 4 TIMES A DAY WITH MEALS AND AT  BEDTIME FOR DIZZINESS  . meloxicam (MOBIC) 15 MG tablet TAKE 1/2 TO 1 TABLET BY MOUTH EVERY DAY AFTER A MEAL.  . mirabegron ER (MYRBETRIQ) 25 MG TB24 tablet Take 25 mg by mouth daily.  . Multiple Vitamin (MULTIVITAMIN WITH MINERALS) TABS tablet Take 1 tablet by mouth daily.  . rosuvastatin (CRESTOR) 10 MG tablet Take 1 tablet (10 mg total) by mouth daily.  . TRAVATAN Z 0.004 % SOLN ophthalmic solution Place 1 drop into both eyes at bedtime.    No facility-administered encounter medications on file as of 12/19/2016.       Review of Systems  Constitutional: Negative.   HENT: Positive for congestion (chest - finished zpak).   Eyes: Negative.   Respiratory: Negative.   Cardiovascular: Negative.   Gastrointestinal: Negative.   Endocrine: Negative.   Genitourinary: Negative.   Musculoskeletal: Negative.   Skin: Negative.   Allergic/Immunologic: Negative.   Neurological: Negative.   Hematological: Negative.   Psychiatric/Behavioral: Negative.        Objective:   Physical Exam  Constitutional: He is oriented to person, place, and time. He appears well-developed and well-nourished. No distress.  HENT:  Head: Normocephalic and atraumatic.  Right Ear: External ear normal.  Left Ear: External ear normal.  Mouth/Throat: Oropharynx is clear and moist. No oropharyngeal exudate.  Nasal turbinate congestion bilaterally  Eyes: Conjunctivae and EOM are normal. Pupils are equal, round, and reactive to light. Right eye exhibits no discharge. Left eye exhibits no discharge.  No scleral icterus.  Neck: Normal range of motion. Neck supple. No thyromegaly present.  No anterior cervical adenopathy and no thyromegaly  Cardiovascular: Normal rate, regular rhythm, normal heart sounds and intact distal pulses.   No murmur heard. The heart was regular at 72/m with no murmurs or gallops  Pulmonary/Chest: Effort normal and breath sounds normal. No respiratory distress. He has no wheezes. He has no rales. He  exhibits no tenderness.  Clear anteriorly and posteriorly without rhonchi rales or bronchial congestion with coughing  Abdominal: Soft. Bowel sounds are normal. He exhibits no mass. There is no tenderness. There is no rebound and no guarding.  Genitourinary:  Genitourinary Comments: The patient saw the urologist in December and everything was good and we'll see him again yearly and he says that his PSA was actually lower than it has been in the past.  Musculoskeletal: Normal range of motion. He exhibits no edema.  Lymphadenopathy:    He has no cervical adenopathy.  Neurological: He is alert and oriented to person, place, and time. He has normal reflexes. No cranial nerve deficit.  Skin: Skin is warm and dry. No rash noted.  Psychiatric: He has a normal mood and affect. His behavior is normal. Judgment and thought content normal.  Nursing note and vitals reviewed.  BP 115/62 (BP Location: Left Arm)   Pulse (!) 57   Temp 97 F (36.1 C) (Oral)   Ht 5' 10"  (1.778 m)   Wt 189 lb (85.7 kg)   BMI 27.12 kg/m        Assessment & Plan:  1. Pure hypercholesterolemia -Continue current treatment pending results of lab work - BMP8+EGFR - CBC with Differential/Platelet - Hepatic function panel - NMR, lipoprofile  2. Vitamin D deficiency -Continue current treatment pending results of lab work - CBC with Differential/Platelet - VITAMIN D 25 Hydroxy (Vit-D Deficiency, Fractures)  3. Thrombocytopenia ([place]) -No history of any bleeding issues.  - CBC with Differential/Platelet  Patient Instructions                       Medicare Annual Wellness Visit  [facility] and the medical providers at [facility] strive to bring you the best medical care.  In doing so we not only want to address your current medical conditions and concerns but also to detect new conditions early and prevent illness, disease and health-related problems.    Medicare offers a yearly Wellness Visit  which allows our clinical staff to assess your need for preventative services including immunizations, lifestyle education, counseling to decrease risk of preventable diseases and screening for fall risk and other medical concerns.    This visit is provided free of charge (no copay) for all Medicare recipients. The clinical pharmacists at [facility] have begun to conduct these Wellness Visits which will also include a thorough review of all your medications.    As you primary medical provider recommend that you make an appointment for your Annual Wellness Visit if you have not done so already this year.  You may set up this appointment before you leave today or you may call back ([phone]) and schedule an appointment.  Please make sure when you call that you mention that you are scheduling your Annual Wellness Visit with the clinical pharmacist so that the appointment may be made for the proper length of time.     Continue current medications. Continue good therapeutic lifestyle changes which include good diet and  exercise. Fall precautions discussed with patient. If an FOBT was given today- please return it to our front desk. If you are over 106 years old - you may need Prevnar 73 or the adult Pneumonia vaccine.  **Flu shots are available--- please call and schedule a FLU-CLINIC appointment**  After your visit with [name] today you will receive a survey in the mail or online from [employer] regarding your care with [name]. Please take a moment to fill this out. Your feedback is very important to [name] as you can help [name] better understand your patient needs as well as improve your experience and satisfaction. WE CARE ABOUT YOU!!!  Continue to practice good respiratory and pulmonary hygiene as well as regular handwashing efforts.   Arrie Senate MD

## 2016-12-19 NOTE — Patient Instructions (Addendum)
Medicare Annual Wellness Visit  [facility] and the medical providers at [facility] strive to bring you the best medical care.  In doing so we not only want to address your current medical conditions and concerns but also to detect new conditions early and prevent illness, disease and health-related problems.    Medicare offers a yearly Wellness Visit which allows our clinical staff to assess your need for preventative services including immunizations, lifestyle education, counseling to decrease risk of preventable diseases and screening for fall risk and other medical concerns.    This visit is provided free of charge (no copay) for all Medicare recipients. The clinical pharmacists at [facility] have begun to conduct these Wellness Visits which will also include a thorough review of all your medications.    As you primary medical provider recommend that you make an appointment for your Annual Wellness Visit if you have not done so already this year.  You may set up this appointment before you leave today or you may call back [phone] and schedule an appointment.  Please make sure when you call that you mention that you are scheduling your Annual Wellness Visit with the clinical pharmacist so that the appointment may be made for the proper length of time.     Continue current medications. Continue good therapeutic lifestyle changes which include good diet and exercise. Fall precautions discussed with patient. If an FOBT was given today- please return it to our front desk. If you are over 59 years old - you may need Prevnar 86 or the adult Pneumonia vaccine.  **Flu shots are available--- please call and schedule a FLU-CLINIC appointment**  After your visit with [name] today you will receive a survey in the mail or online from [employer] regarding your care with [name]. Please take a moment to fill this out. Your feedback is very  important to [name] as you can help [name] better understand your patient needs as well as improve your experience and satisfaction. WE CARE ABOUT YOU!!!  Continue to practice good respiratory and pulmonary hygiene as well as regular handwashing efforts.

## 2016-12-20 LAB — CBC WITH DIFFERENTIAL/PLATELET
BASOS ABS: 0 10*3/uL (ref 0.0–0.2)
Basos: 0 %
EOS (ABSOLUTE): 0.2 10*3/uL (ref 0.0–0.4)
Eos: 3 %
HEMOGLOBIN: 13.7 g/dL (ref 13.0–17.7)
Hematocrit: 40.3 % (ref 37.5–51.0)
IMMATURE GRANS (ABS): 0 10*3/uL (ref 0.0–0.1)
IMMATURE GRANULOCYTES: 0 %
Lymphocytes Absolute: 2 10*3/uL (ref 0.7–3.1)
Lymphs: 34 %
MCH: 30.7 pg (ref 26.6–33.0)
MCHC: 34 g/dL (ref 31.5–35.7)
MCV: 90 fL (ref 79–97)
MONOCYTES: 5 %
Monocytes Absolute: 0.3 10*3/uL (ref 0.1–0.9)
NEUTROS PCT: 58 %
Neutrophils Absolute: 3.4 10*3/uL (ref 1.4–7.0)
PLATELETS: 149 10*3/uL — AB (ref 150–379)
RBC: 4.46 x10E6/uL (ref 4.14–5.80)
RDW: 13.1 % (ref 12.3–15.4)
WBC: 6 10*3/uL (ref 3.4–10.8)

## 2016-12-20 LAB — BMP8+EGFR
BUN/Creatinine Ratio: 14 (ref 10–24)
BUN: 15 mg/dL (ref 8–27)
CHLORIDE: 99 mmol/L (ref 96–106)
CO2: 25 mmol/L (ref 18–29)
Calcium: 9.4 mg/dL (ref 8.6–10.2)
Creatinine, Ser: 1.11 mg/dL (ref 0.76–1.27)
GFR, EST AFRICAN AMERICAN: 73 mL/min/{1.73_m2} (ref >59)
GFR, EST NON AFRICAN AMERICAN: 63 mL/min/{1.73_m2} (ref >59)
Glucose: 88 mg/dL (ref 65–99)
POTASSIUM: 4.6 mmol/L (ref 3.5–5.2)
Sodium: 141 mmol/L (ref 134–144)

## 2016-12-20 LAB — NMR, LIPOPROFILE
CHOLESTEROL: 154 mg/dL (ref 100–199)
HDL Cholesterol by NMR: 60 mg/dL (ref >39)
HDL PARTICLE NUMBER: 37 umol/L (ref >=30.5)
LDL PARTICLE NUMBER: 830 nmol/L (ref <1000)
LDL Size: 21.1 nm (ref >20.5)
LDL-C: 73 mg/dL (ref 0–99)
LP-IR Score: <25 (ref <=45)
Small LDL Particle Number: 260 nmol/L (ref <=527)
TRIGLYCERIDES BY NMR: 104 mg/dL (ref 0–149)

## 2016-12-20 LAB — HEPATIC FUNCTION PANEL
ALBUMIN: 4.4 g/dL (ref 3.5–4.8)
ALK PHOS: 52 IU/L (ref 39–117)
ALT: 15 IU/L (ref 0–44)
AST: 21 IU/L (ref 0–40)
BILIRUBIN TOTAL: 0.5 mg/dL (ref 0.0–1.2)
BILIRUBIN, DIRECT: 0.12 mg/dL (ref 0.00–0.40)
TOTAL PROTEIN: 6.6 g/dL (ref 6.0–8.5)

## 2016-12-20 LAB — VITAMIN D 25 HYDROXY (VIT D DEFICIENCY, FRACTURES): Vit D, 25-Hydroxy: 43.8 ng/mL (ref 30.0–100.0)

## 2017-03-18 ENCOUNTER — Other Ambulatory Visit: Payer: Self-pay | Admitting: Family Medicine

## 2017-05-18 ENCOUNTER — Encounter: Payer: Self-pay | Admitting: Family Medicine

## 2017-05-18 ENCOUNTER — Ambulatory Visit (INDEPENDENT_AMBULATORY_CARE_PROVIDER_SITE_OTHER): Payer: Medicare Other | Admitting: Family Medicine

## 2017-05-18 VITALS — BP 121/54 | HR 49 | Temp 97.0°F | Ht 70.0 in | Wt 184.0 lb

## 2017-05-18 DIAGNOSIS — E559 Vitamin D deficiency, unspecified: Secondary | ICD-10-CM | POA: Diagnosis not present

## 2017-05-18 DIAGNOSIS — R5383 Other fatigue: Secondary | ICD-10-CM | POA: Diagnosis not present

## 2017-05-18 DIAGNOSIS — E7800 Pure hypercholesterolemia, unspecified: Secondary | ICD-10-CM | POA: Diagnosis not present

## 2017-05-18 DIAGNOSIS — G8929 Other chronic pain: Secondary | ICD-10-CM

## 2017-05-18 DIAGNOSIS — W57XXXA Bitten or stung by nonvenomous insect and other nonvenomous arthropods, initial encounter: Secondary | ICD-10-CM

## 2017-05-18 DIAGNOSIS — D696 Thrombocytopenia, unspecified: Secondary | ICD-10-CM

## 2017-05-18 DIAGNOSIS — M5440 Lumbago with sciatica, unspecified side: Secondary | ICD-10-CM | POA: Diagnosis not present

## 2017-05-18 MED ORDER — DOXYCYCLINE HYCLATE 100 MG PO TABS: 100.0000 mg | ORAL_TABLET | Freq: Two times a day (BID) | ORAL | 0 refills | Status: DC

## 2017-05-18 NOTE — Progress Notes (Signed)
Subjective:    Patient ID: Paul Parks, male    DOB: 11-10-1936, 81 y.o.   MRN: [id number]  HPI Pt here for follow up and management of chronic medical problems which includes hyperlipidemia. He is taking medication regularly.The patient is doing well overall. He does complain of multiple tick bites. He gets his prostate and PSA checked by the urologist. He is due to get a repeat FOBT. The patient did have a CT scan of the chest and 816 and we will wait until the next visit to do a chest x-ray. The CT scan at that time indicated that there was no acute cardiopulmonary abnormalities and he did have aortic atherosclerosis. There was a 3 mm right middle lobe nodule that was unchanged from 2011 and there was an abnormality noted in the liver. A follow-up MR I of the liver was done in February 2017. There were tiny sub-centimeters cyst noted in the left hepatic lobe and no radiographic evidence of neoplasm or other acute findings were made. The patient has also had several tick bites and what he thinks were deer tick bites. The patient is concerned that he may need another colonoscopy based on the history of having an anal fistula. The nurse will check with Dr. Amedeo Plenty regarding the need for a colonoscopy in 3 years versus 5 years. In that case he is past due if it's 3 years. The patient denies any chest pain or shortness of breath. He denies any trouble with nausea vomiting diarrhea or change in bowel habits blood in the stool or black tarry bowel movements. He sees the urologist because of an enlarged prostate every 6 months, Dr. Jeffie Pollock.   Patient Active Problem List   Diagnosis Date Noted  . Allergic rhinitis 03/30/2016  . Glaucoma 02/18/2015  . Thrombocytopenia ([place]) 09/24/2014  . Degenerative disc disease, lumbar 10/08/2013  . BPH (benign prostatic hypertrophy) 05/30/2013  . Labyrinthitis 05/30/2013  . Hyperlipemia 03/12/2013  . Personal history of colonic polyps - adenomas 04/24/2008    Outpatient Encounter Prescriptions as of 05/18/2017  Medication Sig  . aspirin 81 MG tablet Take 81 mg by mouth daily.  . cholecalciferol (VITAMIN D) 1000 UNITS tablet Take 2,000 Units by mouth daily.   Marland Kitchen dutasteride (AVODART) 0.5 MG capsule Take 0.5 mg by mouth daily. qod  . fluticasone (FLONASE) 50 MCG/ACT nasal spray INSTILL 1 SPRAY IN EACH NOSTRIL DAILY (Patient taking differently: INSTILL 1 SPRAY IN EACH NOSTRIL DAILY prn)  . meclizine (ANTIVERT) 12.5 MG tablet TAKE 1 TABLET 4 TIMES A DAY WITH MEALS AND AT BEDTIME FOR DIZZINESS  . meloxicam (MOBIC) 15 MG tablet TAKE 1/2 TO 1 TABLET BY MOUTH EVERY DAY AFTER A MEAL.  . mirabegron ER (MYRBETRIQ) 25 MG TB24 tablet Take 25 mg by mouth daily.  . Multiple Vitamin (MULTIVITAMIN WITH MINERALS) TABS tablet Take 1 tablet by mouth daily.  . rosuvastatin (CRESTOR) 10 MG tablet Take 1 tablet (10 mg total) by mouth daily.  . TRAVATAN Z 0.004 % SOLN ophthalmic solution Place 1 drop into both eyes at bedtime.    No facility-administered encounter medications on file as of 05/18/2017.       Review of Systems  Constitutional: Negative.   HENT: Negative.   Eyes: Negative.   Respiratory: Negative.   Cardiovascular: Negative.   Gastrointestinal: Negative.   Endocrine: Negative.   Genitourinary: Negative.   Musculoskeletal: Negative.   Skin: Negative.        Tick bites  Allergic/Immunologic: Negative.   Neurological:  Negative.   Hematological: Negative.   Psychiatric/Behavioral: Negative.        Objective:   Physical Exam  Constitutional: He is oriented to person, place, and time. He appears well-developed and well-nourished. No distress.  The patient is pleasant and alert and looks much younger than his stated age  HENT:  Head: Normocephalic and atraumatic.  Right Ear: External ear normal.  Left Ear: External ear normal.  Nose: Nose normal.  Mouth/Throat: Oropharynx is clear and moist. No oropharyngeal exudate.  Eyes: Conjunctivae and  EOM are normal. Pupils are equal, round, and reactive to light. Right eye exhibits no discharge. Left eye exhibits no discharge. No scleral icterus.  Neck: Normal range of motion. Neck supple. No thyromegaly present.  No bruits thyromegaly or anterior cervical adenopathy  Cardiovascular: Normal rate, regular rhythm and intact distal pulses.   No murmur heard. The heart is regular at 48-52/m  Pulmonary/Chest: Effort normal and breath sounds normal. No respiratory distress. He has no wheezes. He has no rales. He exhibits no tenderness.  No axillary adenopathy  Abdominal: Soft. Bowel sounds are normal. He exhibits no mass. There is no tenderness. There is no rebound and no guarding.  No inguinal adenopathy and no liver or spleen enlargement and no masses with normal bowel sounds  Genitourinary:  Genitourinary Comments: The patient is followed regularly by Dr. Irine Seal about every 6 months because of his BPH  Musculoskeletal: Normal range of motion. He exhibits no edema.  He continues to have ongoing back problems secondary to degenerative arthritis of the lumbar spine. Is especially worse in the morning when he gets up and gets better during the day and he takes periodic meloxicam for this.  Lymphadenopathy:    He has no cervical adenopathy.  Neurological: He is alert and oriented to person, place, and time. He has normal reflexes. No cranial nerve deficit.  Skin: Skin is warm and dry. No rash noted.  Multiple tick bite sites.  Psychiatric: He has a normal mood and affect. His behavior is normal. Judgment and thought content normal.  Nursing note and vitals reviewed.  BP (!) 121/54 (BP Location: Left Arm)   Pulse (!) 49   Temp 97 F (36.1 C) (Oral)   Ht 5' 10"  (1.778 m)   Wt 184 lb (83.5 kg)   BMI 26.40 kg/m         Assessment & Plan:  1. Pure hypercholesterolemia -Continue with current treatment pending results of lab work - CBC with Differential/Platelet - BMP8+EGFR - Hepatic  function panel - NMR, lipoprofile  2. Vitamin D deficiency -Continue with current treatment pending results of lab work - CBC with Differential/Platelet - VITAMIN D 25 Hydroxy (Vit-D Deficiency, Fractures)  3. Thrombocytopenia ([facility]) -No bleeding issues or bruising noted. - CBC with Differential/Platelet  4. Tick bite, initial encounter -Take antibiotic as directed until completed with a large glass of water and with food - CBC with Differential/Platelet - Lyme Ab/Western Blot Reflex - Rocky mtn spotted fvr abs pnl(IgG+IgM) - doxycycline (VIBRA-TABS) 100 MG tablet; Take 1 tablet (100 mg total) by mouth 2 (two) times daily. 1 po bid  Dispense: 42 tablet; Refill: 0 - Magnesium  5. Other fatigue - Magnesium  6. Chronic low back pain with sciatica, sciatica laterality unspecified, unspecified back pain laterality -Continue with meloxicam one half tablet as needed always after eating.  Meds ordered this encounter  Medications  . doxycycline (VIBRA-TABS) 100 MG tablet    Sig: Take 1 tablet (100 mg  total) by mouth 2 (two) times daily. 1 po bid    Dispense:  42 tablet    Refill:  0   Patient Instructions                       Medicare Annual Wellness Visit  [facility] and the medical providers at [facility] strive to bring you the best medical care.  In doing so we not only want to address your current medical conditions and concerns but also to detect new conditions early and prevent illness, disease and health-related problems.    Medicare offers a yearly Wellness Visit which allows our clinical staff to assess your need for preventative services including immunizations, lifestyle education, counseling to decrease risk of preventable diseases and screening for fall risk and other medical concerns.    This visit is provided free of charge (no copay) for all Medicare recipients. The clinical pharmacists at [facility] have begun to  conduct these Wellness Visits which will also include a thorough review of all your medications.    As you primary medical provider recommend that you make an appointment for your Annual Wellness Visit if you have not done so already this year.  You may set up this appointment before you leave today or you may call back ([phone]) and schedule an appointment.  Please make sure when you call that you mention that you are scheduling your Annual Wellness Visit with the clinical pharmacist so that the appointment may be made for the proper length of time.     Continue current medications. Continue good therapeutic lifestyle changes which include good diet and exercise. Fall precautions discussed with patient. If an FOBT was given today- please return it to our front desk. If you are over 81 years old - you may need Prevnar 58 or the adult Pneumonia vaccine.  **Flu shots are available--- please call and schedule a FLU-CLINIC appointment**  After your visit with [name] today you will receive a survey in the mail or online from [employer] regarding your care with [name]. Please take a moment to fill this out. Your feedback is very important to [name] as you can help [name] better understand your patient needs as well as improve your experience and satisfaction. WE CARE ABOUT YOU!!!   Continue to protect yourself well from the potential of tick bites by using deep [place] off and regular skin checks after being out in an area where there are ticks We will try to follow-up with Dr. Amedeo Plenty regarding the need for your next colonoscopy in if you do not hear from [name] within a couple weeks please call us back We will call you with the results of the lab work as soon as the results become available Avoid any climbing to prevent falls Continue to follow-up with the urologist because of the enlarged prostate gland Take the antibiotic doxycycline twice daily with food and a large amount of water Remember if an when you take the  meloxicam to only take it after eating and take it as little as possible especially while taking the doxycycline.    Arrie Senate MD

## 2017-05-18 NOTE — Patient Instructions (Addendum)
Medicare Annual Wellness Visit  [facility] and the medical providers at [facility] strive to bring you the best medical care.  In doing so we not only want to address your current medical conditions and concerns but also to detect new conditions early and prevent illness, disease and health-related problems.    Medicare offers a yearly Wellness Visit which allows our clinical staff to assess your need for preventative services including immunizations, lifestyle education, counseling to decrease risk of preventable diseases and screening for fall risk and other medical concerns.    This visit is provided free of charge (no copay) for all Medicare recipients. The clinical pharmacists at [facility] have begun to conduct these Wellness Visits which will also include a thorough review of all your medications.    As you primary medical provider recommend that you make an appointment for your Annual Wellness Visit if you have not done so already this year.  You may set up this appointment before you leave today or you may call back [phone]) and schedule an appointment.  Please make sure when you call that you mention that you are scheduling your Annual Wellness Visit with the clinical pharmacist so that the appointment may be made for the proper length of time.     Continue current medications. Continue good therapeutic lifestyle changes which include good diet and exercise. Fall precautions discussed with patient. If an FOBT was given today- please return it to our front desk. If you are over 34 years old - you may need Prevnar 24 or the adult Pneumonia vaccine.  **Flu shots are available--- please call and schedule a FLU-CLINIC appointment**  After your visit with [name] today you will receive a survey in the mail or online from [employer] regarding your care with [name]. Please take a moment to fill this out. Your feedback is very  important to [name] as you can help [name] better understand your patient needs as well as improve your experience and satisfaction. WE CARE ABOUT YOU!!!   Continue to protect yourself well from the potential of tick bites by using deep [place] off and regular skin checks after being out in an area where there are ticks We will try to follow-up with Dr. Amedeo Plenty regarding the need for your next colonoscopy in if you do not hear from [name] within a couple weeks please call us back We will call you with the results of the lab work as soon as the results become available Avoid any climbing to prevent falls Continue to follow-up with the urologist because of the enlarged prostate gland Take the antibiotic doxycycline twice daily with food and a large amount of water Remember if an when you take the meloxicam to only take it after eating and take it as little as possible especially while taking the doxycycline.

## 2017-05-19 LAB — NMR, LIPOPROFILE
Cholesterol: 155 mg/dL (ref 100–199)
HDL Cholesterol by NMR: 58 mg/dL (ref >39)
HDL PARTICLE NUMBER: 30.8 umol/L (ref >=30.5)
LDL Particle Number: 1145 nmol/L — ABNORMAL HIGH (ref <1000)
LDL SIZE: 21 nm (ref >20.5)
LDL-C: 79 mg/dL (ref 0–99)
Small LDL Particle Number: 333 nmol/L (ref <=527)
TRIGLYCERIDES BY NMR: 91 mg/dL (ref 0–149)

## 2017-05-22 LAB — CBC WITH DIFFERENTIAL/PLATELET
BASOS ABS: 0 10*3/uL (ref 0.0–0.2)
Basos: 0 %
EOS (ABSOLUTE): 0.1 10*3/uL (ref 0.0–0.4)
EOS: 2 %
HEMATOCRIT: 42.4 % (ref 37.5–51.0)
Hemoglobin: 13.9 g/dL (ref 13.0–17.7)
Immature Grans (Abs): 0 10*3/uL (ref 0.0–0.1)
Immature Granulocytes: 0 %
LYMPHS ABS: 1.9 10*3/uL (ref 0.7–3.1)
Lymphs: 37 %
MCH: 30.5 pg (ref 26.6–33.0)
MCHC: 32.8 g/dL (ref 31.5–35.7)
MCV: 93 fL (ref 79–97)
MONOS ABS: 0.4 10*3/uL (ref 0.1–0.9)
Monocytes: 8 %
Neutrophils Absolute: 2.7 10*3/uL (ref 1.4–7.0)
Neutrophils: 53 %
Platelets: 139 10*3/uL — ABNORMAL LOW (ref 150–379)
RBC: 4.56 x10E6/uL (ref 4.14–5.80)
RDW: 13.7 % (ref 12.3–15.4)
WBC: 5.1 10*3/uL (ref 3.4–10.8)

## 2017-05-22 LAB — BMP8+EGFR
BUN / CREAT RATIO: 15 (ref 10–24)
BUN: 18 mg/dL (ref 8–27)
CO2: 28 mmol/L (ref 20–29)
Calcium: 9.5 mg/dL (ref 8.6–10.2)
Chloride: 100 mmol/L (ref 96–106)
Creatinine, Ser: 1.17 mg/dL (ref 0.76–1.27)
GFR calc Af Amer: 69 mL/min/{1.73_m2} (ref >59)
GFR, EST NON AFRICAN AMERICAN: 59 mL/min/{1.73_m2} — AB (ref >59)
Glucose: 92 mg/dL (ref 65–99)
Potassium: 4.6 mmol/L (ref 3.5–5.2)
SODIUM: 138 mmol/L (ref 134–144)

## 2017-05-22 LAB — MAGNESIUM: MAGNESIUM: 2.1 mg/dL (ref 1.6–2.3)

## 2017-05-22 LAB — ROCKY MTN SPOTTED FVR ABS PNL(IGG+IGM)
RMSF IgG: NEGATIVE
RMSF IgM: 0.55 index (ref 0.00–0.89)

## 2017-05-22 LAB — HEPATIC FUNCTION PANEL
ALBUMIN: 4.2 g/dL (ref 3.5–4.8)
ALT: 17 IU/L (ref 0–44)
AST: 19 IU/L (ref 0–40)
Alkaline Phosphatase: 47 IU/L (ref 39–117)
Bilirubin Total: 0.7 mg/dL (ref 0.0–1.2)
Bilirubin, Direct: 0.18 mg/dL (ref 0.00–0.40)
Total Protein: 6.5 g/dL (ref 6.0–8.5)

## 2017-05-22 LAB — VITAMIN D 25 HYDROXY (VIT D DEFICIENCY, FRACTURES): VIT D 25 HYDROXY: 46.3 ng/mL (ref 30.0–100.0)

## 2017-05-22 LAB — LYME AB/WESTERN BLOT REFLEX: LYME DISEASE AB, QUANT, IGM: <0.8 index (ref 0.00–0.79)

## 2017-06-18 ENCOUNTER — Other Ambulatory Visit: Payer: Self-pay | Admitting: Family Medicine

## 2017-07-12 DIAGNOSIS — Z85828 Personal history of other malignant neoplasm of skin: Secondary | ICD-10-CM | POA: Insufficient documentation

## 2017-08-26 ENCOUNTER — Other Ambulatory Visit: Payer: Self-pay | Admitting: Family Medicine

## 2017-10-05 ENCOUNTER — Encounter: Payer: Self-pay | Admitting: Family Medicine

## 2017-10-05 ENCOUNTER — Ambulatory Visit (INDEPENDENT_AMBULATORY_CARE_PROVIDER_SITE_OTHER): Payer: Medicare Other | Admitting: Family Medicine

## 2017-10-05 ENCOUNTER — Ambulatory Visit (INDEPENDENT_AMBULATORY_CARE_PROVIDER_SITE_OTHER): Payer: Medicare Other

## 2017-10-05 VITALS — BP 110/60 | HR 56 | Temp 98.6°F | Ht 70.0 in | Wt 186.0 lb

## 2017-10-05 DIAGNOSIS — E7800 Pure hypercholesterolemia, unspecified: Secondary | ICD-10-CM

## 2017-10-05 DIAGNOSIS — M5440 Lumbago with sciatica, unspecified side: Secondary | ICD-10-CM | POA: Diagnosis not present

## 2017-10-05 DIAGNOSIS — E559 Vitamin D deficiency, unspecified: Secondary | ICD-10-CM

## 2017-10-05 DIAGNOSIS — G8929 Other chronic pain: Secondary | ICD-10-CM

## 2017-10-05 DIAGNOSIS — D696 Thrombocytopenia, unspecified: Secondary | ICD-10-CM

## 2017-10-05 DIAGNOSIS — Z1211 Encounter for screening for malignant neoplasm of colon: Secondary | ICD-10-CM | POA: Diagnosis not present

## 2017-10-05 DIAGNOSIS — M5136 Other intervertebral disc degeneration, lumbar region: Secondary | ICD-10-CM | POA: Diagnosis not present

## 2017-10-05 DIAGNOSIS — Z23 Encounter for immunization: Secondary | ICD-10-CM

## 2017-10-05 DIAGNOSIS — R5383 Other fatigue: Secondary | ICD-10-CM | POA: Diagnosis not present

## 2017-10-05 DIAGNOSIS — I700 Atherosclerosis of aorta: Secondary | ICD-10-CM

## 2017-10-05 MED ORDER — CYCLOBENZAPRINE HCL 5 MG PO TABS
5.0000 mg | ORAL_TABLET | Freq: Two times a day (BID) | ORAL | 0 refills | Status: DC | PRN
Start: 2017-10-05 — End: 2018-04-02

## 2017-10-05 NOTE — Addendum Note (Signed)
Addended by: Zannie Cove on: 10/05/2017 09:39 AM   Modules accepted: Orders

## 2017-10-05 NOTE — Patient Instructions (Addendum)
Medicare Annual Wellness Visit  [facility] and the medical providers at [facility] strive to bring you the best medical care.  In doing so we not only want to address your current medical conditions and concerns but also to detect new conditions early and prevent illness, disease and health-related problems.    Medicare offers a yearly Wellness Visit which allows our clinical staff to assess your need for preventative services including immunizations, lifestyle education, counseling to decrease risk of preventable diseases and screening for fall risk and other medical concerns.    This visit is provided free of charge (no copay) for all Medicare recipients. The clinical pharmacists at [facility] have begun to conduct these Wellness Visits which will also include a thorough review of all your medications.    As you primary medical provider recommend that you make an appointment for your Annual Wellness Visit if you have not done so already this year.  You may set up this appointment before you leave today or you may call back ([phone]) and schedule an appointment.  Please make sure when you call that you mention that you are scheduling your Annual Wellness Visit with the clinical pharmacist so that the appointment may be made for the proper length of time.    Continue current medications. Continue good therapeutic lifestyle changes which include good diet and exercise. Fall precautions discussed with patient. If an FOBT was given today- please return it to our front desk. If you are over 41 years old - you may need Prevnar 61 or the adult Pneumonia vaccine.  **Flu shots are available--- please call and schedule a FLU-CLINIC appointment**  After your visit with [name] today you will receive a survey in the mail or online from [employer] regarding your care with [name]. Please take a moment to fill this out. Your feedback is very  important to [name] as you can help [name] better understand your patient needs as well as improve your experience and satisfaction. WE CARE ABOUT YOU!!!  Take meloxicam as little as possible and remember that it can irritate your stomach and affect the kidney and blood pressure readings. Stay active as possible physically but avoid heavy lifting pushing pulling and bending activities as much as possible If the back pain continues, we may want to get an appointment with an orthopedic specialist to further evaluate that and possibly give you some shots in the back to help relieve the pain To keep the weight down as much as possible Look into getting the new shingles shot and check with your insurance about this.

## 2017-10-05 NOTE — Progress Notes (Signed)
Subjective:    Patient ID: Paul Parks, male    DOB: 12/09/1935, 81 y.o.   MRN: [id number]  HPI Pt here for follow up and management of chronic medical problems which includes hyperlipidemia. He is taking medication regularly.  The patient today complains of some decreased energy and back pain.  CT scan results of the chest and abdomen were reviewed and the follow-up report was also reviewed and everything appears to be stable with a follow-up report as far as liver and lung are concerned.  The patient is followed regularly by the urologist.  He has a family history of heart disease and diabetes.  There is also some emphysema in his father.  There is also chronic back pain and arthritis in a brother that has passed away.  The patient is pleasant and alert.  He has an upcoming visit with the urologist in November or December.  He sees him once or twice a year.  He denies any chest pain or shortness of breath.  He denies any trouble with his stomach including nausea vomiting diarrhea blood in the stool or black tarry bowel movements.  He is passing his water as expected and it is slow at times.  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  He stays active and stays as active as possible.  He complains of decreased energy that is been going on for a while with soreness and tiredness.  He is also requesting some type of muscle relaxant for his back.  He will get the flu shot today.    Patient Active Problem List   Diagnosis Date Noted  . Allergic rhinitis 03/30/2016  . Glaucoma 02/18/2015  . Thrombocytopenia ([place]) 09/24/2014  . Degenerative disc disease, lumbar 10/08/2013  . BPH (benign prostatic hypertrophy) 05/30/2013  . Labyrinthitis 05/30/2013  . Hyperlipemia 03/12/2013  . Personal history of colonic polyps - adenomas 04/24/2008   Outpatient Encounter Prescriptions as of 10/05/2017  Medication Sig  .  aspirin 81 MG tablet Take 81 mg by mouth daily.  . cholecalciferol (VITAMIN D) 1000 UNITS tablet Take 2,000 Units by mouth daily.   Marland Kitchen dutasteride (AVODART) 0.5 MG capsule Take 0.5 mg by mouth daily. qod  . fluticasone (FLONASE) 50 MCG/ACT nasal spray INSTILL 1 SPRAY IN EACH NOSTRIL DAILY (Patient taking differently: INSTILL 1 SPRAY IN EACH NOSTRIL DAILY prn)  . Multiple Vitamin (MULTIVITAMIN WITH MINERALS) TABS tablet Take 1 tablet by mouth daily.  . rosuvastatin (CRESTOR) 10 MG tablet Take 1 tablet (10 mg total) by mouth daily.  . TRAVATAN Z 0.004 % SOLN ophthalmic solution Place 1 drop into both eyes at bedtime.   . meclizine (ANTIVERT) 12.5 MG tablet TAKE 1 TABLET 4 TIMES A DAY WITH MEALS AND AT BEDTIME FOR DIZZINESS (Patient not taking: Reported on 10/05/2017)  . meloxicam (MOBIC) 15 MG tablet TAKE 1/2 TO 1 TABLET BY MOUTH EVERY DAY AFTER A MEAL. (Patient not taking: Reported on 10/05/2017)  . mirabegron ER (MYRBETRIQ) 25 MG TB24 tablet Take 25 mg by mouth daily.  . [DISCONTINUED] doxycycline (VIBRA-TABS) 100 MG tablet Take 1 tablet (100 mg total) by mouth 2 (two) times daily. 1 po bid   No facility-administered encounter medications on file as of 10/05/2017.       Review of Systems  Constitutional: Positive for fatigue.  HENT: Negative.   Eyes: Negative.   Respiratory: Negative.   Cardiovascular: Negative.  Gastrointestinal: Negative.   Endocrine: Negative.   Genitourinary: Negative.   Musculoskeletal: Positive for back pain (mid to low back pain - since fall - out of a truck bed).  Skin: Negative.   Allergic/Immunologic: Negative.   Neurological: Negative.   Hematological: Negative.   Psychiatric/Behavioral: Negative.        Objective:   Physical Exam  Constitutional: He is oriented to person, place, and time. He appears well-developed and well-nourished. No distress.  The patient is pleasant and relaxed  HENT:  Head: Normocephalic and atraumatic.  Right Ear: External ear  normal.  Left Ear: External ear normal.  Nose: Nose normal.  Mouth/Throat: Oropharynx is clear and moist. No oropharyngeal exudate.  Eyes: Pupils are equal, round, and reactive to light. Conjunctivae and EOM are normal. Right eye exhibits no discharge. Left eye exhibits no discharge. No scleral icterus.  Neck: Normal range of motion. Neck supple. No thyromegaly present.  No bruits thyromegaly or anterior cervical adenopathy  Cardiovascular: Normal rate, regular rhythm, normal heart sounds and intact distal pulses.   No murmur heard. Heart is regular at 60/min  Pulmonary/Chest: Effort normal and breath sounds normal. No respiratory distress. He has no wheezes. He has no rales. He exhibits no tenderness.  Clear anteriorly and posteriorly and no axillary adenopathy  Abdominal: Soft. Bowel sounds are normal. He exhibits no mass. There is no tenderness. There is no rebound and no guarding.  No liver or spleen enlargement no masses no bruits and no inguinal adenopathy with good inguinal pulses  Genitourinary:  Genitourinary Comments: This exam is done yearly by his urologist up soon in November or December.  Musculoskeletal: Normal range of motion. He exhibits no edema.  Lymphadenopathy:    He has no cervical adenopathy.  Neurological: He is alert and oriented to person, place, and time. He has normal reflexes. No cranial nerve deficit.  Skin: Skin is warm and dry. No rash noted.  Psychiatric: He has a normal mood and affect. His behavior is normal. Judgment and thought content normal.  Nursing note and vitals reviewed.  BP 110/60 (BP Location: Left Arm)   Pulse (!) 56   Temp 98.6 F (37 C) (Oral)   Ht _0  (1.778 m)   Wt 186 lb (84.4 kg)   BMI 26.69 kg/m         Assessment & Plan:  1. Pure hypercholesterolemia -Continue current treatment and as aggressive therapeutic lifestyle changes as possible which include diet and exercise - CBC with Differential/Platelet - BMP8+EGFR -  Lipid panel - Hepatic function panel - DG Chest 2 View; Future  2. Vitamin D deficiency -To new current treatment pending results of lab work - CBC with Differential/Platelet - VITAMIN D 25 Hydroxy (Vit-D Deficiency, Fractures)  3. Thrombocytopenia (HCC) -No history of any bleeding issues or bruising issues. - CBC with Differential/Platelet - DG Chest 2 View; Future  4. Screen for colon cancer -This was actually returned today and we will call the patient with the results as soon as it becomes available - Fecal occult blood, imunochemical  5. Chronic low back pain with sciatica, sciatica laterality unspecified, unspecified back pain laterality -And pulling and avoid climbing as much as possible continue to walk and exercise regularly and avoid heavy lifting pushing  6. Other fatigue -Check lab work  7. Degenerative disc disease, lumbar -The patient has a history of degenerative disc disease.  He has had a workup in the past and discussed with him today that if the pain  continues to get worse that we would send him back to the orthopedic specialist for further evaluation and possibly some injection.  8.  Aortic atherosclerosis -Continue with statin drug and with as aggressive therapeutic lifestyle changes as possible  Patient Instructions                       Medicare Annual Wellness Visit  [facility] and the medical providers at [facility] strive to bring you the best medical care.  In doing so we not only want to address your current medical conditions and concerns but also to detect new conditions early and prevent illness, disease and health-related problems.    Medicare offers a yearly Wellness Visit which allows our clinical staff to assess your need for preventative services including immunizations, lifestyle education, counseling to decrease risk of preventable diseases and screening for fall risk and other medical concerns.    This visit is  provided free of charge (no copay) for all Medicare recipients. The clinical pharmacists at [facility] have begun to conduct these Wellness Visits which will also include a thorough review of all your medications.    As you primary medical provider recommend that you make an appointment for your Annual Wellness Visit if you have not done so already this year.  You may set up this appointment before you leave today or you may call back ([phone]) and schedule an appointment.  Please make sure when you call that you mention that you are scheduling your Annual Wellness Visit with the clinical pharmacist so that the appointment may be made for the proper length of time.    Continue current medications. Continue good therapeutic lifestyle changes which include good diet and exercise. Fall precautions discussed with patient. If an FOBT was given today- please return it to our front desk. If you are over 18 years old - you may need Prevnar 36 or the adult Pneumonia vaccine.  **Flu shots are available--- please call and schedule a FLU-CLINIC appointment**  After your visit with [name] today you will receive a survey in the mail or online from [employer] regarding your care with [name]. Please take a moment to fill this out. Your feedback is very important to [name] as you can help [name] better understand your patient needs as well as improve your experience and satisfaction. WE CARE ABOUT YOU!!!  Take meloxicam as little as possible and remember that it can irritate your stomach and affect the kidney and blood pressure readings. Stay active as possible physically but avoid heavy lifting pushing pulling and bending activities as much as possible If the back pain continues, we may want to get an appointment with an orthopedic specialist to further evaluate that and possibly give you some shots in the back to help relieve the pain To keep the weight down as much as possible Look into getting the new  shingles shot and check with your insurance about this.   Arrie Senate MD

## 2017-10-06 LAB — CBC WITH DIFFERENTIAL/PLATELET
BASOS: 0 %
Basophils Absolute: 0 10*3/uL (ref 0.0–0.2)
EOS (ABSOLUTE): 0.1 10*3/uL (ref 0.0–0.4)
EOS: 2 %
HEMATOCRIT: 38.8 % (ref 37.5–51.0)
HEMOGLOBIN: 13.5 g/dL (ref 13.0–17.7)
IMMATURE GRANULOCYTES: 0 %
Immature Grans (Abs): 0 10*3/uL (ref 0.0–0.1)
LYMPHS ABS: 1.9 10*3/uL (ref 0.7–3.1)
Lymphs: 31 %
MCH: 31 pg (ref 26.6–33.0)
MCHC: 34.8 g/dL (ref 31.5–35.7)
MCV: 89 fL (ref 79–97)
MONOCYTES: 8 %
Monocytes Absolute: 0.5 10*3/uL (ref 0.1–0.9)
Neutrophils Absolute: 3.5 10*3/uL (ref 1.4–7.0)
Neutrophils: 59 %
Platelets: 146 10*3/uL — ABNORMAL LOW (ref 150–379)
RBC: 4.35 x10E6/uL (ref 4.14–5.80)
RDW: 13.5 % (ref 12.3–15.4)
WBC: 6 10*3/uL (ref 3.4–10.8)

## 2017-10-06 LAB — THYROID PANEL WITH TSH
Free Thyroxine Index: 1.3 (ref 1.2–4.9)
T3 UPTAKE RATIO: 26 % (ref 24–39)
T4 TOTAL: 5 ug/dL (ref 4.5–12.0)
TSH: 2.91 u[IU]/mL (ref 0.450–4.500)

## 2017-10-06 LAB — BMP8+EGFR
BUN/Creatinine Ratio: 18 (ref 10–24)
BUN: 20 mg/dL (ref 8–27)
CALCIUM: 9.1 mg/dL (ref 8.6–10.2)
CO2: 24 mmol/L (ref 20–29)
CREATININE: 1.12 mg/dL (ref 0.76–1.27)
Chloride: 100 mmol/L (ref 96–106)
GFR calc Af Amer: 72 mL/min/{1.73_m2} (ref >59)
GFR, EST NON AFRICAN AMERICAN: 62 mL/min/{1.73_m2} (ref >59)
Glucose: 82 mg/dL (ref 65–99)
Potassium: 4.5 mmol/L (ref 3.5–5.2)
Sodium: 140 mmol/L (ref 134–144)

## 2017-10-06 LAB — HEPATIC FUNCTION PANEL
ALT: 12 IU/L (ref 0–44)
AST: 17 IU/L (ref 0–40)
Albumin: 4.1 g/dL (ref 3.5–4.8)
Alkaline Phosphatase: 51 IU/L (ref 39–117)
Bilirubin Total: 0.6 mg/dL (ref 0.0–1.2)
Bilirubin, Direct: 0.17 mg/dL (ref 0.00–0.40)
Total Protein: 6.2 g/dL (ref 6.0–8.5)

## 2017-10-06 LAB — VITAMIN D 25 HYDROXY (VIT D DEFICIENCY, FRACTURES): Vit D, 25-Hydroxy: 42.6 ng/mL (ref 30.0–100.0)

## 2017-10-06 LAB — LIPID PANEL
CHOL/HDL RATIO: 2.6 ratio (ref 0.0–5.0)
Cholesterol, Total: 159 mg/dL (ref 100–199)
HDL: 61 mg/dL (ref >39)
LDL CALC: 82 mg/dL (ref 0–99)
TRIGLYCERIDES: 78 mg/dL (ref 0–149)
VLDL Cholesterol Cal: 16 mg/dL (ref 5–40)

## 2017-10-07 LAB — FECAL OCCULT BLOOD, IMMUNOCHEMICAL: FECAL OCCULT BLD: NEGATIVE

## 2017-11-05 ENCOUNTER — Other Ambulatory Visit: Payer: Self-pay | Admitting: Family Medicine

## 2018-01-04 ENCOUNTER — Other Ambulatory Visit: Payer: Self-pay | Admitting: *Deleted

## 2018-01-04 MED ORDER — MELOXICAM 15 MG PO TABS: ORAL_TABLET | ORAL | 0 refills | Status: DC

## 2018-02-04 ENCOUNTER — Other Ambulatory Visit: Payer: Self-pay | Admitting: Family Medicine

## 2018-02-15 ENCOUNTER — Other Ambulatory Visit: Payer: Medicare Other

## 2018-02-15 DIAGNOSIS — D696 Thrombocytopenia, unspecified: Secondary | ICD-10-CM

## 2018-02-15 DIAGNOSIS — E7800 Pure hypercholesterolemia, unspecified: Secondary | ICD-10-CM

## 2018-02-16 ENCOUNTER — Telehealth: Payer: Self-pay | Admitting: Family Medicine

## 2018-02-16 LAB — CBC WITH DIFFERENTIAL/PLATELET
BASOS ABS: 0 10*3/uL (ref 0.0–0.2)
BASOS: 0 %
EOS (ABSOLUTE): 0.2 10*3/uL (ref 0.0–0.4)
Eos: 3 %
Hematocrit: 39.5 % (ref 37.5–51.0)
Hemoglobin: 13.8 g/dL (ref 13.0–17.7)
IMMATURE GRANULOCYTES: 0 %
Immature Grans (Abs): 0 10*3/uL (ref 0.0–0.1)
Lymphocytes Absolute: 2.2 10*3/uL (ref 0.7–3.1)
Lymphs: 36 %
MCH: 31.8 pg (ref 26.6–33.0)
MCHC: 34.9 g/dL (ref 31.5–35.7)
MCV: 91 fL (ref 79–97)
MONOS ABS: 0.5 10*3/uL (ref 0.1–0.9)
Monocytes: 8 %
Neutrophils Absolute: 3.3 10*3/uL (ref 1.4–7.0)
Neutrophils: 53 %
PLATELETS: 143 10*3/uL — AB (ref 150–379)
RBC: 4.34 x10E6/uL (ref 4.14–5.80)
RDW: 13.8 % (ref 12.3–15.4)
WBC: 6.2 10*3/uL (ref 3.4–10.8)

## 2018-02-16 LAB — LIPID PANEL
CHOLESTEROL TOTAL: 179 mg/dL (ref 100–199)
Chol/HDL Ratio: 3 ratio (ref 0.0–5.0)
HDL: 60 mg/dL (ref >39)
LDL CALC: 104 mg/dL — AB (ref 0–99)
TRIGLYCERIDES: 73 mg/dL (ref 0–149)
VLDL CHOLESTEROL CAL: 15 mg/dL (ref 5–40)

## 2018-02-16 LAB — HEPATIC FUNCTION PANEL
ALBUMIN: 4.2 g/dL (ref 3.5–4.8)
ALK PHOS: 48 IU/L (ref 39–117)
ALT: 13 IU/L (ref 0–44)
AST: 19 IU/L (ref 0–40)
Bilirubin Total: 0.6 mg/dL (ref 0.0–1.2)
Bilirubin, Direct: 0.18 mg/dL (ref 0.00–0.40)
Total Protein: 6.2 g/dL (ref 6.0–8.5)

## 2018-02-16 LAB — BMP8+EGFR
BUN/Creatinine Ratio: 21 (ref 10–24)
BUN: 25 mg/dL (ref 8–27)
CALCIUM: 9 mg/dL (ref 8.6–10.2)
CO2: 24 mmol/L (ref 20–29)
CREATININE: 1.18 mg/dL (ref 0.76–1.27)
Chloride: 103 mmol/L (ref 96–106)
GFR calc Af Amer: 67 mL/min/{1.73_m2} (ref >59)
GFR calc non Af Amer: 58 mL/min/{1.73_m2} — ABNORMAL LOW (ref >59)
GLUCOSE: 82 mg/dL (ref 65–99)
Potassium: 4.2 mmol/L (ref 3.5–5.2)
SODIUM: 140 mmol/L (ref 134–144)

## 2018-02-16 NOTE — Telephone Encounter (Signed)
Refer to labs  °

## 2018-02-23 ENCOUNTER — Encounter: Payer: Self-pay | Admitting: Family Medicine

## 2018-02-23 ENCOUNTER — Ambulatory Visit: Payer: Medicare Other | Admitting: Family Medicine

## 2018-02-23 VITALS — BP 115/58 | HR 60 | Temp 97.6°F | Ht 70.0 in | Wt 189.0 lb

## 2018-02-23 DIAGNOSIS — M5440 Lumbago with sciatica, unspecified side: Secondary | ICD-10-CM | POA: Diagnosis not present

## 2018-02-23 DIAGNOSIS — G8929 Other chronic pain: Secondary | ICD-10-CM | POA: Diagnosis not present

## 2018-02-23 DIAGNOSIS — R5383 Other fatigue: Secondary | ICD-10-CM | POA: Diagnosis not present

## 2018-02-23 DIAGNOSIS — E7800 Pure hypercholesterolemia, unspecified: Secondary | ICD-10-CM

## 2018-02-23 DIAGNOSIS — I700 Atherosclerosis of aorta: Secondary | ICD-10-CM | POA: Diagnosis not present

## 2018-02-23 DIAGNOSIS — E559 Vitamin D deficiency, unspecified: Secondary | ICD-10-CM

## 2018-02-23 DIAGNOSIS — D696 Thrombocytopenia, unspecified: Secondary | ICD-10-CM

## 2018-02-23 NOTE — Patient Instructions (Addendum)
Medicare Annual Wellness Visit  [facility] and the medical providers at [facility] strive to bring you the best medical care.  In doing so we not only want to address your current medical conditions and concerns but also to detect new conditions early and prevent illness, disease and health-related problems.    Medicare offers a yearly Wellness Visit which allows our clinical staff to assess your need for preventative services including immunizations, lifestyle education, counseling to decrease risk of preventable diseases and screening for fall risk and other medical concerns.    This visit is provided free of charge (no copay) for all Medicare recipients. The clinical pharmacists at [facility] have begun to conduct these Wellness Visits which will also include a thorough review of all your medications.    As you primary medical provider recommend that you make an appointment for your Annual Wellness Visit if you have not done so already this year.  You may set up this appointment before you leave today or you may call back ([phone]) and schedule an appointment.  Please make sure when you call that you mention that you are scheduling your Annual Wellness Visit with the clinical pharmacist so that the appointment may be made for the proper length of time.    Continue current medications. Continue good therapeutic lifestyle changes which include good diet and exercise. Fall precautions discussed with patient. If an FOBT was given today- please return it to our front desk. If you are over 33 years old - you may need Prevnar 73 or the adult Pneumonia vaccine.  **Flu shots are available--- please call and schedule a FLU-CLINIC appointment**  After your visit with [name] today you will receive a survey in the mail or online from [employer] regarding your care with [name]. Please take a moment to fill this out. Your feedback is very  important to [name] as you can help [name] better understand your patient needs as well as improve your experience and satisfaction. WE CARE ABOUT YOU!!!   The patient should continue with his current treatment and try to increase his exercise regimen especially with walking and continue with his diet efforts to lower his cholesterol.

## 2018-02-23 NOTE — Progress Notes (Signed)
Subjective:    Patient ID: Paul Parks, male    DOB: 01-27-1936, 82 y.o.   MRN: [id number]  HPI Pt here for follow up and management of chronic medical problems which includes hyperlipidemia. He is taking medication regularly.  The patient today does complain of fatigue and muscle soreness and ongoing back pain.  He has had lab work done and we will review this with him during the visit.  He just saw the urologist last month for follow-up of his prostate issues.  He has had lab work done and this will be reviewed with him during the visit today.  All liver function tests were normal.  The blood sugar creatinine and electrolytes were normal.  Cholesterol numbers had an LDL C that was elevated at 104 and should be less than 104 months ago it was lower.  Triglycerides are good and the good cholesterol remains good.  The white blood cell count and hemoglobin are good the platelet count remains decreased as it has been in the past.  Denies any chest pain or shortness of breath.  He says he has been active on his treadmill.  He does still complain of fatigue and muscle soreness.  He says if he gets out and works in the yard a lot he has a lot more problems with his back and neck.  He denies any trouble with swallowing heartburn indigestion nausea vomiting diarrhea or blood in the stool.  He thinks that he had polyps on his last colonoscopy and this is why they were going to do another one in 2020.  I told him that they should remind him of that when that time comes around.  Passing his water okay and recently did see the urologist who gave him additional medicine for better control which she has not started yet.      Patient Active Problem List   Diagnosis Date Noted  . Aortic atherosclerosis ([place]) 10/05/2017  . Allergic rhinitis 03/30/2016  . Glaucoma 02/18/2015  . Thrombocytopenia ([place]) 09/24/2014  . Degenerative disc disease, lumbar 10/08/2013  . BPH (benign prostatic hypertrophy) 05/30/2013  .  Labyrinthitis 05/30/2013  . Hyperlipemia 03/12/2013  . Personal history of colonic polyps - adenomas 04/24/2008   Outpatient Encounter Medications as of 02/23/2018  Medication Sig  . aspirin 81 MG tablet Take 81 mg by mouth daily.  . cholecalciferol (VITAMIN D) 1000 UNITS tablet Take 2,000 Units by mouth daily.   . cyclobenzaprine (FLEXERIL) 5 MG tablet Take 1 tablet (5 mg total) by mouth 2 (two) times daily as needed for muscle spasms.  Marland Kitchen dutasteride (AVODART) 0.5 MG capsule Take 0.5 mg by mouth daily. qod  . fluticasone (FLONASE) 50 MCG/ACT nasal spray INSTILL 1 SPRAY IN EACH NOSTRIL DAILY (Patient taking differently: INSTILL 1 SPRAY IN EACH NOSTRIL DAILY prn)  . meclizine (ANTIVERT) 12.5 MG tablet TAKE 1 TABLET 4 TIMES A DAY WITH MEALS AND AT BEDTIME FOR DIZZINESS  . meloxicam (MOBIC) 15 MG tablet TAKE 1/2 TO 1 TABLET BY MOUTH EVERY DAY AFTER A MEAL.  . mirabegron ER (MYRBETRIQ) 25 MG TB24 tablet Take 25 mg by mouth daily.  . Multiple Vitamin (MULTIVITAMIN WITH MINERALS) TABS tablet Take 1 tablet by mouth daily.  . rosuvastatin (CRESTOR) 10 MG tablet TAKE 1 TABLET (10 MG TOTAL) BY MOUTH DAILY.  . TRAVATAN Z 0.004 % SOLN ophthalmic solution Place 1 drop into both eyes at bedtime.    No facility-administered encounter medications on file as of 02/23/2018.  Review of Systems  Constitutional: Positive for fatigue.  HENT: Negative.   Eyes: Negative.   Respiratory: Negative.   Cardiovascular: Negative.   Gastrointestinal: Negative.   Endocrine: Negative.   Genitourinary: Negative.   Musculoskeletal: Positive for back pain (on-going) and myalgias.  Skin: Negative.   Allergic/Immunologic: Negative.   Neurological: Negative.   Hematological: Negative.   Psychiatric/Behavioral: Negative.        Objective:   Physical Exam  Constitutional: He is oriented to person, place, and time. He appears well-developed and well-nourished. No distress.  The patient is pleasant and alert and  looks much younger than his stated age  HENT:  Head: Normocephalic and atraumatic.  Right Ear: External ear normal.  Left Ear: External ear normal.  Mouth/Throat: Oropharynx is clear and moist. No oropharyngeal exudate.  Nasal turbinate congestion and he uses Flonase.  Eyes: Pupils are equal, round, and reactive to light. Conjunctivae and EOM are normal. Right eye exhibits no discharge. Left eye exhibits no discharge. No scleral icterus.  Neck: Normal range of motion. Neck supple. No thyromegaly present.  No bruits thyromegaly or anterior cervical adenopathy  Cardiovascular: Normal rate, regular rhythm, normal heart sounds and intact distal pulses.  No murmur heard. Heart has a regular rate and rhythm at 60/min  Pulmonary/Chest: Effort normal and breath sounds normal. No respiratory distress. He has no wheezes. He has no rales. He exhibits no tenderness.  Clear anteriorly and posteriorly and no axillary adenopathy no chest wall tenderness or masses  Abdominal: Soft. Bowel sounds are normal. He exhibits no mass. There is no tenderness. There is no rebound and no guarding.  No liver or spleen enlargement no epigastric tenderness no bruits no inguinal adenopathy  Genitourinary:  Genitourinary Comments: Followed regularly by urology and last seen in February of this year.  Musculoskeletal: Normal range of motion. He exhibits no edema or tenderness.  Ongoing low back pain and joint stiffness  Lymphadenopathy:    He has no cervical adenopathy.  Neurological: He is alert and oriented to person, place, and time. He has normal reflexes. No cranial nerve deficit.  Skin: Skin is warm and dry. No rash noted.  Psychiatric: He has a normal mood and affect. His behavior is normal. Judgment and thought content normal.  Nursing note and vitals reviewed.  BP (!) 115/58 (BP Location: Left Arm)   Pulse 60   Temp 97.6 F (36.4 C) (Oral)   Ht 5\' 10"  (1.778 m)   Wt 189 lb (85.7 kg)   BMI 27.12 kg/m          Assessment & Plan:  1. Thrombocytopenia (HCC) -The platelet count remains slightly decreased as it has been in the past and the patient has not had any increase bleeding or bruising tendencies.  2. Pure hypercholesterolemia -Cholesterol numbers had an LDL C that was only slightly elevated today.  He will try to increase his activity regimen with more walking and continue with aggressive therapeutic lifestyle changes and current statin treatment.  3. Vitamin D deficiency -Continue with vitamin D replacement  4. Other fatigue -Resume more aggressive exercise regimen  5. Aortic atherosclerosis ([place]) -Continue with statin drug and therapeutic lifestyle changes  6. Chronic low back pain with sciatica, sciatica laterality unspecified, unspecified back pain laterality -Walk regularly and take Tylenol if needed for pain   Patient Instructions                       Medicare Annual Wellness Visit  [facility] and the medical providers at [facility] strive to bring you the best medical care.  In doing so we not only want to address your current medical conditions and concerns but also to detect new conditions early and prevent illness, disease and health-related problems.    Medicare offers a yearly Wellness Visit which allows our clinical staff to assess your need for preventative services including immunizations, lifestyle education, counseling to decrease risk of preventable diseases and screening for fall risk and other medical concerns.    This visit is provided free of charge (no copay) for all Medicare recipients. The clinical pharmacists at [facility] have begun to conduct these Wellness Visits which will also include a thorough review of all your medications.    As you primary medical provider recommend that you make an appointment for your Annual Wellness Visit if you have not done so already this year.  You may set up this  appointment before you leave today or you may call back ([phone]) and schedule an appointment.  Please make sure when you call that you mention that you are scheduling your Annual Wellness Visit with the clinical pharmacist so that the appointment may be made for the proper length of time.    Continue current medications. Continue good therapeutic lifestyle changes which include good diet and exercise. Fall precautions discussed with patient. If an FOBT was given today- please return it to our front desk. If you are over 34 years old - you may need Prevnar 24 or the adult Pneumonia vaccine.  **Flu shots are available--- please call and schedule a FLU-CLINIC appointment**  After your visit with [name] today you will receive a survey in the mail or online from [employer] regarding your care with [name]. Please take a moment to fill this out. Your feedback is very important to [name] as you can help [name] better understand your patient needs as well as improve your experience and satisfaction. WE CARE ABOUT YOU!!!   The patient should continue with his current treatment and try to increase his exercise regimen especially with walking and continue with his diet efforts to lower his cholesterol.  Arrie Senate MD

## 2018-04-01 ENCOUNTER — Other Ambulatory Visit: Payer: Self-pay | Admitting: Family Medicine

## 2018-04-02 ENCOUNTER — Ambulatory Visit (INDEPENDENT_AMBULATORY_CARE_PROVIDER_SITE_OTHER): Payer: Medicare Other | Admitting: *Deleted

## 2018-04-02 VITALS — BP 134/49 | HR 44 | Temp 97.3°F | Ht 70.0 in | Wt 188.0 lb

## 2018-04-02 DIAGNOSIS — Z0000 Encounter for general adult medical examination without abnormal findings: Secondary | ICD-10-CM

## 2018-04-02 DIAGNOSIS — R001 Bradycardia, unspecified: Secondary | ICD-10-CM | POA: Diagnosis not present

## 2018-04-02 MED ORDER — MELOXICAM 15 MG PO TABS: ORAL_TABLET | ORAL | 1 refills | Status: DC

## 2018-04-02 NOTE — Patient Instructions (Signed)
  Paul Parks , Thank you for taking time to come for your Medicare Wellness Visit. I appreciate your ongoing commitment to your health goals. Please review the following plan we discussed and let me know if I can assist you in the future.   These are the goals we discussed: Goals    . Prevent falls     Stay active Travel        This is a list of the screening recommended for you and due dates:  Health Maintenance  Topic Date Due  . Flu Shot  07/05/2018  . Colon Cancer Screening  02/07/2019  . Tetanus Vaccine  11/07/2021  . Pneumonia vaccines  Completed    Keep follow up with Dr Laurance Flatten and other specialist  We will schedule a ETT (treadmill ) for Dec 2019. Be careful, not to put yourself at risk for falls  Stay active

## 2018-04-02 NOTE — Progress Notes (Addendum)
Subjective:   Paul Parks is a 82 y.o. male who presents for Medicare Annual/Subsequent preventive examination. 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 He enjoys yard work and watching his grandchildren in his spar time. For exercise he walks a treadmill and lifts weights about 5 days a week. He states that he eats a healthy diet and gets in 3 meals a day. He is active in the church and the community. He lives at home with his wife, Paul Parks. They have 2 sons, which both live out of town. He does not have any pets at this time and fall prevention was discussed today. Pt had a Low heart rate when checked today and Ekg was preformed. [facility] states that his health is about the same as it was a year ago.    Cardiac Risk Factors include: advanced age (>3men, >39 women);male gender     Objective:    Vitals: BP (!) 134/49 (BP Location: Left Arm)   Pulse (!) 44   Temp (!) 97.3 F (36.3 C) (Oral)   Ht 5\' 10"  (1.778 m)   Wt 188 lb (85.3 kg)   BMI 26.98 kg/m   Body mass index is 26.98 kg/m.  Advanced Directives 04/02/2018 06/23/2016 02/18/2015  Does Patient Have a Medical Advance Directive? No No No  Would patient like information on creating a medical advance directive? Yes (MAU/Ambulatory/Procedural Areas - Information given) Yes - Educational materials given Yes - Scientist, clinical (histocompatibility and immunogenetics) given    Tobacco Social History   Tobacco Use  Smoking Status Never Smoker  Smokeless Tobacco Never Used     Counseling given: Not Answered   Clinical Intake:                       Past Medical History:  Diagnosis Date  . Allergy   . Bladder disorder    takes bladder control meds  . BPH (benign prostatic hyperplasia)   . Cataract   . DDD (degenerative disc disease)   . Glaucoma   . Hyperlipidemia   . Personal history of colonic polyps   . Skull fracture ([place]) 1955   from [place]   . Vitamin D deficiency     Past Surgical History:  Procedure Laterality Date  . COLON SURGERY     polyps   . COLONOSCOPY    . EYE SURGERY Bilateral    cataracts   . left knee surgery- microscopic  Left 80's   [facility]   . 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 TREATMENT FISTULA ANAL     Family History  Problem Relation Age of Onset  . Heart disease Mother   . Diabetes Mother        diet controlled  . Other Mother        tetanus / lock jaw  . Heart disease Brother   . Emphysema Brother        smoker  . Heart disease Father   . Emphysema Father        smoker  . Glaucoma Father   . Cancer Sister        brain and stomach / colon   . Emphysema Sister        smoker  . Lumbar disc disease Sister   . Hyperlipidemia Sister   . Hypertension Sister   . Heart disease Sister   . Polycythemia  Son   . Heart disease Sister   . Hyperlipidemia Son   . Colon cancer Neg Hx    Social History   Socioeconomic History  . Marital status: Married    Spouse name: Paul Parks  . Number of children: 2  . Years of education: Not on file  . Highest education level: Not on file  Occupational History  . 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 Financial resource strain: Not on file  . Food insecurity:    Worry: Not on file    Inability: Not on file  . Transportation needs:    Medical: Not on file    Non-medical: Not on file  Tobacco Use  . Smoking status: Never Smoker  . Smokeless tobacco: Never Used  Substance and Sexual Activity  . Alcohol use: Yes    Alcohol/week: 0.0 oz    Comment: every 2 months 1-2 beers                            . Drug use: No  . Sexual activity: Yes  Lifestyle  . Physical activity:    Days per week: Not on file    Minutes per session: Not on file  . Stress: Not on file  Relationships  . Social connections:    Talks on phone: Not on file    Gets together: Not on file    Attends religious  service: Not on file    Active member of club or organization: Not on file    Attends meetings of clubs or organizations: Not on file    Relationship status: Not on file  Other Topics Concern  . Not on file  Social History Narrative   Lives at home with wife, Paul Parks     Outpatient Encounter Medications as of 04/02/2018  Medication Sig  . aspirin 81 MG tablet Take 81 mg by mouth daily.  . cholecalciferol (VITAMIN D) 1000 UNITS tablet Take 2,000 Units by mouth daily.   . fluticasone (FLONASE) 50 MCG/ACT nasal spray INSTILL 1 SPRAY IN EACH NOSTRIL DAILY (Patient taking differently: INSTILL 1 SPRAY IN EACH NOSTRIL DAILY prn)  . mirabegron ER (MYRBETRIQ) 25 MG TB24 tablet Take 25 mg by mouth daily.  . Multiple Vitamin (MULTIVITAMIN WITH MINERALS) TABS tablet Take 1 tablet by mouth daily.  . rosuvastatin (CRESTOR) 10 MG tablet TAKE 1 TABLET (10 MG TOTAL) BY MOUTH DAILY.  . TRAVATAN Z 0.004 % SOLN ophthalmic solution Place 1 drop into both eyes at bedtime.   . meclizine (ANTIVERT) 12.5 MG tablet TAKE 1 TABLET 4 TIMES A DAY WITH MEALS AND AT BEDTIME FOR DIZZINESS (Patient not taking: Reported on 04/02/2018)  . meloxicam (MOBIC) 15 MG tablet TAKE 1/2 TO 1 TABLET BY MOUTH EVERY DAY AFTER A MEAL.  . [DISCONTINUED] cyclobenzaprine (FLEXERIL) 5 MG tablet Take 1 tablet (5 mg total) by mouth 2 (two) times daily as needed for muscle spasms. (Patient not taking: Reported on 04/02/2018)  . [DISCONTINUED] dutasteride (AVODART) 0.5 MG capsule Take 0.5 mg by mouth daily. qod  . [DISCONTINUED] meloxicam (MOBIC) 15 MG tablet TAKE 1/2 TO 1 TABLET BY MOUTH EVERY DAY AFTER A MEAL. (Patient not taking: Reported on 04/02/2018)   No facility-administered encounter medications on file as of 04/02/2018.     Activities of Daily Living In your present state of health, do you have any difficulty performing  the following activities: 04/02/2018  Hearing? N  Vision? N  Difficulty concentrating or making decisions? N  Walking  or climbing stairs? N  Dressing or bathing? N  Doing errands, shopping? N  Preparing Food and eating ? N  Using the Toilet? N  In the past six months, have you accidently leaked urine? Y  Comment takes med for this - BPH   Do you have problems with loss of bowel control? N  Managing your Medications? N  Managing your Finances? N  Housekeeping or managing your Housekeeping? N  Some recent data might be hidden    Patient Care Team: Chipper Herb, MD as PCP - General (Family Medicine) Bond, Tracie Harrier, MD as Referring Physician (Ophthalmology) Irine Seal, MD as Attending Physician (Urology) Garry Heater, MD as Consulting Physician (Dermatology) Philomena Doheny, MD as Consulting Physician (Plastic Surgery) Gatha Mayer, MD as Consulting Physician (Gastroenterology)   Assessment:   This is a routine wellness examination for Lathon.  Exercise Activities and Dietary recommendations Current Exercise Habits: Home exercise routine(also yardwork ), Type of exercise: treadmill;strength training/weights, Time (Minutes): 30, Frequency (Times/Week): 6, Weekly Exercise (Minutes/Week): 180, Intensity: Moderate  Goals    . Prevent falls     Stay active Travel        Fall Risk Fall Risk  04/02/2018 02/23/2018 10/05/2017 12/19/2016 08/23/2016  Falls in the past year? No No Yes No No  Number falls in past yr: - - 1 - -  Injury with Fall? - - Yes - -  Risk for fall due to : - - - - -   Is the patient's home free of loose throw rugs in walkways, pet beds, electrical cords, etc?   Fall risk and hazards were discussed today.  Depression Screen PHQ 2/9 Scores 04/02/2018 02/23/2018 10/05/2017 12/19/2016  PHQ - 2 Score 0 0 0 0    Cognitive Function MMSE - Mini Mental State Exam 04/02/2018 06/23/2016 02/18/2015  Orientation to time 5 5 5   Orientation to Place 5 5 5   Registration 3 3 3   Attention/ Calculation 5 3 3   Recall 3 3 3   Language- name 2 objects 2 2 2   Language- repeat 1 1  1   Language- follow 3 step command 3 3 3   Language- read & follow direction 1 1 1   Write a sentence 1 1 1   Copy design 1 1 1   Total score 30 28 28         Immunization History  Administered Date(s) Administered  . Influenza, High Dose Seasonal PF 10/13/2015, 09/22/2016, 10/05/2017  . Influenza,inj,Quad PF,6+ Mos 10/08/2013, 10/21/2014  . Pneumococcal Conjugate-13 02/12/2014  . Pneumococcal Polysaccharide-23 03/19/2009  . Tdap 11/08/2011  . Zoster 01/22/2007    Qualifies for Shingles Vaccine? Declines new vaccine - had the old one  Screening Tests Health Maintenance  Topic Date Due  . INFLUENZA VACCINE  07/05/2018  . COLONOSCOPY  02/07/2019  . TETANUS/TDAP  11/07/2021  . PNA vac Low Risk Adult  Completed   Cancer Screenings: Lung: Low Dose CT Chest recommended if Age 53-80 years, 30 pack-year currently smoking OR have quit w/in 15years. Patient does qualify. Colorectal: done 10/2017.  Additional Screenings: declined  Hepatitis C Screening:      Plan:   pt is to keep follow up with Dr Laurance Flatten and other specialist. He had a EKG today due to decreased Heart rate and some light-headed feelings. Pt was seen for this by Dr Wendi Snipes.  Pt is due for  ETT (5 years ) in Dec, 2018. He may be going to cardiology prior to this date for cardiac eval.   I have personally reviewed and noted the following in the patient's chart:   . Medical and social history . Use of alcohol, tobacco or illicit drugs  . Current medications and supplements . Functional ability and status . Nutritional status . Physical activity . Advanced directives . List of other physicians . Hospitalizations, surgeries, and ER visits in previous 12 months . Vitals . Screenings to include cognitive, depression, and falls . Referrals and appointments  In addition, I have reviewed and discussed with patient certain preventive protocols, quality metrics, and best practice recommendations. A written personalized  care plan for preventive services as well as general preventive health recommendations were provided to patient.     Bullins, Cameron Proud, LPN  [birth year]   I have reviewed and agree with the above AWV documentation.   Patient also seen personally due to low heart rate.  EKG shows first-degree AV block.  Patient is asymptomatic and feels well. He does have decreased energy in general lately ever this is not new.  Patient denies any chest pain, shortness of breath, palpitations, or dizziness.  He denies any presyncope or syncope. We reviewed red flags and reasons to seek emergency medical care, I have recommended seeing cardiology given his low heart rate today.  Referral placed.  Laroy Apple, MD [facility] 04/02/2018, 5:16 PM

## 2018-04-11 ENCOUNTER — Telehealth: Payer: Self-pay | Admitting: Family Medicine

## 2018-04-11 NOTE — Telephone Encounter (Signed)
Note given to referrals

## 2018-04-11 NOTE — Telephone Encounter (Signed)
Checking on referral to cardio.

## 2018-05-17 DIAGNOSIS — E785 Hyperlipidemia, unspecified: Secondary | ICD-10-CM | POA: Insufficient documentation

## 2018-05-21 ENCOUNTER — Encounter

## 2018-05-21 ENCOUNTER — Encounter: Payer: Self-pay | Admitting: Cardiology

## 2018-05-21 ENCOUNTER — Ambulatory Visit: Payer: Medicare Other | Admitting: Cardiology

## 2018-05-21 VITALS — BP 115/62 | HR 55 | Ht 70.0 in | Wt 185.6 lb

## 2018-05-21 DIAGNOSIS — R002 Palpitations: Secondary | ICD-10-CM

## 2018-05-21 DIAGNOSIS — E785 Hyperlipidemia, unspecified: Secondary | ICD-10-CM | POA: Diagnosis not present

## 2018-05-21 DIAGNOSIS — R9431 Abnormal electrocardiogram [ECG] [EKG]: Secondary | ICD-10-CM | POA: Diagnosis not present

## 2018-05-21 NOTE — Progress Notes (Signed)
05/21/2018 Paul Parks   Sep 02, 1936  [id number]  Primary Physician Chipper Herb, MD Primary Cardiologist: To be determined  HPI:  82 y/o male referred [facility] [name] from [facility] for an abnormal EKG. Mr Starace is a healthy male, active, does 30 minutes daily on a treadmill, no prior history of CAD, chest pain, or arrhythmia. On a recent office visit he was noted to have an abnormal EKG with bradycardia. Review of his EKG shows 1st degree AVB, PVCs, and blocked PACs. The patient admits to occasional "hard heart beats" at night but denies any sustained tachycardia. He has a vague sensation of feeling fatigued at the need of his day but no dramatic drop off in exercise capacity and he still does his treadmill 30 minutes daily. He has had "dizzy spells" in the past though they are infrequent, once or twice a year. These are associated with nausea. He denies any actual syncopal spells.    Current Outpatient Medications  Medication Sig Dispense Refill  . aspirin 81 MG tablet Take 81 mg by mouth daily.    . cholecalciferol (VITAMIN D) 1000 UNITS tablet Take 2,000 Units by mouth daily.     . finasteride (PROSCAR) 5 MG tablet Take 5 mg by mouth 2 (two) times a week.    . fluticasone (FLONASE) 50 MCG/ACT nasal spray INSTILL 1 SPRAY IN EACH NOSTRIL DAILY (Patient taking differently: INSTILL 1 SPRAY IN EACH NOSTRIL DAILY prn) 16 g 0  . meclizine (ANTIVERT) 12.5 MG tablet TAKE 1 TABLET 4 TIMES A DAY WITH MEALS AND AT BEDTIME FOR DIZZINESS 120 tablet 1  . meloxicam (MOBIC) 15 MG tablet TAKE 1/2 TO 1 TABLET BY MOUTH EVERY DAY AFTER A MEAL. 90 tablet 1  . Multiple Vitamin (MULTIVITAMIN WITH MINERALS) TABS tablet Take 1 tablet by mouth daily.    . rosuvastatin (CRESTOR) 10 MG tablet Take 10 mg by mouth 2 (two) times a week.    . TRAVATAN Z 0.004 % SOLN ophthalmic solution Place 1 drop into both eyes at bedtime.   11   No current facility-administered medications for this visit.     Allergies  Allergen  Reactions  . Sulfa Antibiotics Itching    Rash   . Vesicare [Solifenacin] Other (See Comments)    HA     Past Medical History:  Diagnosis Date  . Allergic rhinitis   . Allergy   . Aortic atherosclerosis ([place])   . Bladder disorder    takes bladder control meds  . BPH (benign prostatic hyperplasia)   . Cataract   . DDD (degenerative disc disease)   . Glaucoma   . Hyperlipemia   . Hyperlipidemia   . Labyrinthitis   . Personal history of colonic polyps   . Skull fracture ([place]) 1955   from [place]   . Thrombocytopenia ([place])   . Vitamin D deficiency     Social History   Socioeconomic History  . Marital status: Married    Spouse name: [name]  . Number of children: 2  . Years of education: Not on file  . Highest education level: Not on file  Occupational History  . 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 Financial resource strain: Not on file  . Food insecurity:    Worry: Not on file    Inability: Not on file  . Transportation needs:    Medical: Not on file    Non-medical: Not  on file  Tobacco Use  . Smoking status: Never Smoker  . Smokeless tobacco: Never Used  Substance and Sexual Activity  . Alcohol use: Yes    Alcohol/week: 0.0 oz    Comment: every 2 months 1-2 beers                            . Drug use: No  . Sexual activity: Yes  Lifestyle  . Physical activity:    Days per week: Not on file    Minutes per session: Not on file  . Stress: Not on file  Relationships  . Social connections:    Talks on phone: Not on file    Gets together: Not on file    Attends religious service: Not on file    Active member of club or organization: Not on file    Attends meetings of clubs or organizations: Not on file    Relationship status: Not on file  . Intimate partner violence:    Fear of current or ex partner: Not on file    Emotionally abused: Not on file    Physically abused: Not on file    Forced  sexual activity: Not on file  Other Topics Concern  . Not on file  Social History Narrative   Lives at home with wife, [name]      Family History  Problem Relation Age of Onset  . Heart disease Mother   . Diabetes Mother        diet controlled  . Other Mother        tetanus / lock jaw  . Heart disease Brother   . Emphysema Brother        smoker  . Heart disease Father   . Emphysema Father        smoker  . Glaucoma Father   . Cancer Sister        brain and stomach / colon   . Emphysema Sister        smoker  . Lumbar disc disease Sister   . Hyperlipidemia Sister   . Hypertension Sister   . Heart disease Sister   . Polycythemia Son   . Heart disease Sister   . Hyperlipidemia Son   . Colon cancer Neg Hx      Review of Systems: General: negative for chills, fever, night sweats or weight changes.  Cardiovascular: negative for chest pain, dyspnea on exertion, edema, orthopnea, paroxysmal nocturnal dyspnea or shortness of breath Dermatological: negative for rash Respiratory: negative for cough or wheezing Urologic: negative for hematuria Abdominal: negative for nausea, vomiting, diarrhea, bright red blood per rectum, melena, or hematemesis Neurologic: negative for visual changes, syncope All other systems reviewed and are otherwise negative except as noted above.    Blood pressure 115/62, pulse (!) 55, height 5\' 10"  (1.778 m), weight 185 lb 9.6 oz (84.2 kg).  General appearance: alert, cooperative and no distress Neck: no carotid bruit and no JVD Lungs: clear to auscultation bilaterally Heart: regular rate and rhythm Abdomen: soft, non-tender; bowel sounds normal; no masses,  no organomegaly Extremities: extremities normal, atraumatic, no cyanosis or edema Pulses: 2+ and symmetric Skin: Skin color, texture, turgor normal. No rashes or lesions Neurologic: Grossly normal  EKG NSR, 1st degree AVB, HR 55  ASSESSMENT AND PLAN:   Abnormal EKG Bradycardia with 1st  degree AVB, PVCs, and blocked PACs It's not clear he is symptomatic at this time.   Hyperlipidemia  On twice a week crestor  Palpitations R/O significant arrhythmia    PLAN  Review with Dr Oval Linsey ion the office today. We will obtain an echo and a 48 hour Holter. Dr Oval Linsey suggested the patient follow up with EP and this will be arranged.   Kerin Ransom PA-C 05/21/2018 10:08 AM

## 2018-05-21 NOTE — Assessment & Plan Note (Addendum)
R/O significant arrhythmia

## 2018-05-21 NOTE — Assessment & Plan Note (Signed)
On twice a week crestor

## 2018-05-21 NOTE — Assessment & Plan Note (Signed)
Bradycardia with 1st degree AVB, PVCs, and blocked PACs It's not clear he is symptomatic at this time.

## 2018-05-21 NOTE — Patient Instructions (Signed)
Medication Instructions:  Continue current medications  If you need a refill on your cardiac medications before your next appointment, please call your pharmacy.  Labwork: None Ordered   Testing/Procedures: Your physician has requested that you have an echocardiogram. Echocardiography is a painless test that uses sound waves to create images of your heart. It provides your doctor with information about the size and shape of your heart and how well your heart's chambers and valves are working. This procedure takes approximately one hour. There are no restrictions for this procedure.  Your physician has recommended that you wear a holter monitor for 48 hours. Holter monitors are medical devices that record the heart's electrical activity. Doctors most often use these monitors to diagnose arrhythmias. Arrhythmias are problems with the speed or rhythm of the heartbeat. The monitor is a small, portable device. You can wear one while you do your normal daily activities. This is usually used to diagnose what is causing palpitations/syncope (passing out).   Follow-Up: Your physician wants you to follow-up in: Dr Curt Bears.      Thank you for choosing [facility]!!

## 2018-05-25 ENCOUNTER — Ambulatory Visit (INDEPENDENT_AMBULATORY_CARE_PROVIDER_SITE_OTHER): Payer: Medicare Other

## 2018-05-25 ENCOUNTER — Ambulatory Visit (HOSPITAL_COMMUNITY): Payer: Medicare Other | Attending: Cardiovascular Disease

## 2018-05-25 ENCOUNTER — Other Ambulatory Visit: Payer: Self-pay

## 2018-05-25 DIAGNOSIS — I082 Rheumatic disorders of both aortic and tricuspid valves: Secondary | ICD-10-CM | POA: Diagnosis not present

## 2018-05-25 DIAGNOSIS — E785 Hyperlipidemia, unspecified: Secondary | ICD-10-CM | POA: Diagnosis not present

## 2018-05-25 DIAGNOSIS — Z8249 Family history of ischemic heart disease and other diseases of the circulatory system: Secondary | ICD-10-CM | POA: Diagnosis not present

## 2018-05-25 DIAGNOSIS — R9431 Abnormal electrocardiogram [ECG] [EKG]: Secondary | ICD-10-CM | POA: Diagnosis present

## 2018-05-25 DIAGNOSIS — R002 Palpitations: Secondary | ICD-10-CM | POA: Diagnosis not present

## 2018-05-31 ENCOUNTER — Telehealth: Payer: Self-pay | Admitting: Cardiology

## 2018-05-31 NOTE — Telephone Encounter (Signed)
Pt called for the monitor results please give him a call back today after 2:30p

## 2018-05-31 NOTE — Telephone Encounter (Signed)
Pt aware monitor has not been reviewed by Dr Lovena Le will call with findings once reviewed ./cy

## 2018-06-03 ENCOUNTER — Telehealth: Payer: Self-pay | Admitting: Cardiology

## 2018-06-03 NOTE — Telephone Encounter (Signed)
Called to discuss f/u echo in 6 months- no answer  Jesiel Garate PA-C 06/03/2018 10:05 AM

## 2018-06-05 ENCOUNTER — Institutional Professional Consult (permissible substitution): Payer: Medicare Other | Admitting: Cardiology

## 2018-06-06 ENCOUNTER — Other Ambulatory Visit: Payer: Self-pay

## 2018-06-06 DIAGNOSIS — I700 Atherosclerosis of aorta: Secondary | ICD-10-CM

## 2018-06-11 ENCOUNTER — Telehealth: Payer: Self-pay | Admitting: Cardiology

## 2018-06-11 NOTE — Telephone Encounter (Signed)
Preliminary result reviewed/given to patient. He understands more detail will be reviewed at [place] this Friday with Dr. Lovena Le. He is agreeable to plan.

## 2018-06-11 NOTE — Telephone Encounter (Signed)
New Message     Patient is returning a call to the nurse  to get monitor results

## 2018-06-14 ENCOUNTER — Other Ambulatory Visit: Payer: Self-pay

## 2018-06-15 ENCOUNTER — Encounter: Payer: Self-pay | Admitting: Internal Medicine

## 2018-06-15 ENCOUNTER — Ambulatory Visit: Payer: Medicare Other | Admitting: Internal Medicine

## 2018-06-15 VITALS — BP 146/64 | HR 52 | Ht 70.0 in | Wt 185.4 lb

## 2018-06-15 DIAGNOSIS — R002 Palpitations: Secondary | ICD-10-CM | POA: Diagnosis not present

## 2018-06-15 DIAGNOSIS — I493 Ventricular premature depolarization: Secondary | ICD-10-CM | POA: Diagnosis not present

## 2018-06-15 NOTE — Patient Instructions (Addendum)
Medication Instructions:  Your physician recommends that you continue on your current medications as directed. Please refer to the Current Medication list given to you today.  Check your blood pressure once a week.  Labwork: None ordered.  Testing/Procedures: None ordered.  Follow-Up: Your physician wants you to follow-up in: 4-5 months with Dr. Lovena Le.     Any Other Special Instructions Will Be Listed Below (If Applicable).  If you need a refill on your cardiac medications before your next appointment, please call your pharmacy.

## 2018-06-15 NOTE — Progress Notes (Signed)
HPI Mr. Neidert is referred by Mr. Rosalyn Gess for evaluation of bradycardia and PVC"s. He is a pleasant very active 82 yo man who has been healthy. He is active and works in his shop and garden for hours at a time. He has not had syncope. He wore a cardiac monitor which demonstrated sinus brady with PVC's and NSVT.  Allergies  Allergen Reactions  . Sulfa Antibiotics Itching    Rash   . Vesicare [Solifenacin] Other (See Comments)    HA      Current Outpatient Medications  Medication Sig Dispense Refill  . aspirin 81 MG tablet Take 81 mg by mouth daily.    . cholecalciferol (VITAMIN D) 1000 UNITS tablet Take 2,000 Units by mouth daily.     . finasteride (PROSCAR) 5 MG tablet Take 5 mg by mouth 2 (two) times a week.    . fluticasone (FLONASE) 50 MCG/ACT nasal spray Place 1 spray into both nostrils daily.    . meclizine (ANTIVERT) 12.5 MG tablet Take one (1) tablet (12.5 mg) by mouth four (4) times daily with meals and at bedtime for dizziness.    . meloxicam (MOBIC) 15 MG tablet TAKE 1/2 TO 1 TABLET BY MOUTH EVERY DAY AFTER A MEAL. 90 tablet 1  . Multiple Vitamin (MULTIVITAMIN WITH MINERALS) TABS tablet Take 1 tablet by mouth daily.    . rosuvastatin (CRESTOR) 10 MG tablet Take 10 mg by mouth 2 (two) times a week.    . TRAVATAN Z 0.004 % SOLN ophthalmic solution Place 1 drop into both eyes at bedtime.   11   No current facility-administered medications for this visit.      Past Medical History:  Diagnosis Date  . Allergic rhinitis   . Allergy   . Aortic atherosclerosis ([place])   . Bladder disorder    takes bladder control meds  . BPH (benign prostatic hyperplasia)   . Cataract   . DDD (degenerative disc disease)   . Glaucoma   . Hyperlipemia   . Hyperlipidemia   . Labyrinthitis   . Personal history of colonic polyps   . Skull fracture ([place]) 1955   from [place]   . Thrombocytopenia ([place])   . Vitamin D deficiency     ROS:   All systems reviewed and negative except as  noted in the HPI.   Past Surgical History:  Procedure Laterality Date  . COLON SURGERY     polyps   . COLONOSCOPY    . EYE SURGERY Bilateral    cataracts   . left knee surgery- microscopic  Left 80's   [facility]   . 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 TREATMENT FISTULA ANAL       Family History  Problem Relation Age of Onset  . Heart disease Mother   . Diabetes Mother        diet controlled  . Other Mother        tetanus / lock jaw  . Heart disease Brother   . Emphysema Brother        smoker  . Heart disease Father   . Emphysema Father        smoker  . Glaucoma Father   . Cancer Sister        brain and stomach / colon   . Emphysema Sister        smoker  . Lumbar disc disease Sister   .  Hyperlipidemia Sister   . Hypertension Sister   . Heart disease Sister   . Polycythemia Son   . Heart disease Sister   . Hyperlipidemia Son   . Colon cancer Neg Hx      Social History   Socioeconomic History  . Marital status: Married    Spouse name: [name]  . Number of children: 2  . Years of education: Not on file  . Highest education level: Not on file  Occupational History  . 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 Financial resource strain: Not on file  . Food insecurity:    Worry: Not on file    Inability: Not on file  . Transportation needs:    Medical: Not on file    Non-medical: Not on file  Tobacco Use  . Smoking status: Never Smoker  . Smokeless tobacco: Never Used  Substance and Sexual Activity  . Alcohol use: Yes    Alcohol/week: 0.0 oz    Comment: every 2 months 1-2 beers                            . Drug use: No  . Sexual activity: Yes  Lifestyle  . Physical activity:    Days per week: Not on file    Minutes per session: Not on file  . Stress: Not on file  Relationships  . Social connections:    Talks on phone: Not on file    Gets together: Not on  file    Attends religious service: Not on file    Active member of club or organization: Not on file    Attends meetings of clubs or organizations: Not on file    Relationship status: Not on file  . Intimate partner violence:    Fear of current or ex partner: Not on file    Emotionally abused: Not on file    Physically abused: Not on file    Forced sexual activity: Not on file  Other Topics Concern  . Not on file  Social History Narrative   Lives at home with wife, [name]      BP (!) 146/64   Pulse (!) 52   Ht 5\' 10"  (1.778 m)   Wt 185 lb 6.4 oz (84.1 kg)   BMI 26.60 kg/m   Physical Exam:  Well appearing 82 yo man, NAD HEENT: Unremarkable Neck:  6 cm JVD, no thyromegally Lymphatics:  No adenopathy Back:  No CVA tenderness Lungs:  Clear,  HEART:  Regular rate rhythm, no murmurs, no rubs, no clicks Abd:  soft, positive bowel sounds, no organomegally, no rebound, no guarding Ext:  2 plus pulses, no edema, no cyanosis, no clubbing Skin:  No rashes no nodules Neuro:  CN II through XII intact, motor grossly intact  EKG - reviewed - sinus bradycardia  Assess/Plan: 1. Sinus node dysfunction - 24 hour holter demonstrates this but he is asymptomatic or mostly so. I recommended watchful waiting and avoidance of any sinus nodal blocking drugs. 2. HTN - he may have some white coat HTN. When I recheck his vitals, his pressure was 130/82. I have asked him to check his blood pressure at least once a week and to make a log. I will review when he returns for followup. 3. PVC's - he is minimally symptomatic. He has preserved LV function by echo.   Carleene Overlie  Taylor,M.D.

## 2018-07-09 ENCOUNTER — Other Ambulatory Visit: Payer: Medicare Other

## 2018-07-09 DIAGNOSIS — E7800 Pure hypercholesterolemia, unspecified: Secondary | ICD-10-CM

## 2018-07-09 DIAGNOSIS — E559 Vitamin D deficiency, unspecified: Secondary | ICD-10-CM

## 2018-07-09 DIAGNOSIS — D696 Thrombocytopenia, unspecified: Secondary | ICD-10-CM

## 2018-07-09 DIAGNOSIS — I700 Atherosclerosis of aorta: Secondary | ICD-10-CM

## 2018-07-10 LAB — CBC WITH DIFFERENTIAL/PLATELET
BASOS: 0 %
Basophils Absolute: 0 10*3/uL (ref 0.0–0.2)
EOS (ABSOLUTE): 0.1 10*3/uL (ref 0.0–0.4)
EOS: 2 %
HEMATOCRIT: 39.9 % (ref 37.5–51.0)
HEMOGLOBIN: 13.7 g/dL (ref 13.0–17.7)
Immature Grans (Abs): 0 10*3/uL (ref 0.0–0.1)
Immature Granulocytes: 0 %
Lymphocytes Absolute: 1.6 10*3/uL (ref 0.7–3.1)
Lymphs: 37 %
MCH: 31.3 pg (ref 26.6–33.0)
MCHC: 34.3 g/dL (ref 31.5–35.7)
MCV: 91 fL (ref 79–97)
MONOCYTES: 6 %
MONOS ABS: 0.3 10*3/uL (ref 0.1–0.9)
Neutrophils Absolute: 2.5 10*3/uL (ref 1.4–7.0)
Neutrophils: 55 %
Platelets: 138 10*3/uL — ABNORMAL LOW (ref 150–450)
RBC: 4.38 x10E6/uL (ref 4.14–5.80)
RDW: 13.7 % (ref 12.3–15.4)
WBC: 4.5 10*3/uL (ref 3.4–10.8)

## 2018-07-10 LAB — HEPATIC FUNCTION PANEL
ALT: 14 IU/L (ref 0–44)
AST: 18 IU/L (ref 0–40)
Albumin: 4.1 g/dL (ref 3.5–4.8)
Alkaline Phosphatase: 48 IU/L (ref 39–117)
Bilirubin Total: 0.6 mg/dL (ref 0.0–1.2)
Bilirubin, Direct: 0.16 mg/dL (ref 0.00–0.40)
Total Protein: 5.9 g/dL — ABNORMAL LOW (ref 6.0–8.5)

## 2018-07-10 LAB — BMP8+EGFR
BUN/Creatinine Ratio: 18 (ref 10–24)
BUN: 21 mg/dL (ref 8–27)
CO2: 26 mmol/L (ref 20–29)
CREATININE: 1.17 mg/dL (ref 0.76–1.27)
Calcium: 8.9 mg/dL (ref 8.6–10.2)
Chloride: 102 mmol/L (ref 96–106)
GFR, EST AFRICAN AMERICAN: 68 mL/min/{1.73_m2} (ref >59)
GFR, EST NON AFRICAN AMERICAN: 59 mL/min/{1.73_m2} — AB (ref >59)
Glucose: 91 mg/dL (ref 65–99)
POTASSIUM: 4.7 mmol/L (ref 3.5–5.2)
Sodium: 142 mmol/L (ref 134–144)

## 2018-07-10 LAB — VITAMIN D 25 HYDROXY (VIT D DEFICIENCY, FRACTURES): VIT D 25 HYDROXY: 37.8 ng/mL (ref 30.0–100.0)

## 2018-07-10 LAB — LIPID PANEL
CHOL/HDL RATIO: 2.9 ratio (ref 0.0–5.0)
Cholesterol, Total: 182 mg/dL (ref 100–199)
HDL: 63 mg/dL (ref >39)
LDL CALC: 102 mg/dL — AB (ref 0–99)
TRIGLYCERIDES: 87 mg/dL (ref 0–149)
VLDL Cholesterol Cal: 17 mg/dL (ref 5–40)

## 2018-07-11 ENCOUNTER — Ambulatory Visit: Payer: Medicare Other | Admitting: Family Medicine

## 2018-07-11 ENCOUNTER — Encounter: Payer: Self-pay | Admitting: Family Medicine

## 2018-07-11 VITALS — BP 109/57 | HR 57 | Temp 98.2°F | Ht 70.0 in | Wt 187.0 lb

## 2018-07-11 DIAGNOSIS — R001 Bradycardia, unspecified: Secondary | ICD-10-CM

## 2018-07-11 DIAGNOSIS — M5136 Other intervertebral disc degeneration, lumbar region: Secondary | ICD-10-CM

## 2018-07-11 DIAGNOSIS — D696 Thrombocytopenia, unspecified: Secondary | ICD-10-CM

## 2018-07-11 DIAGNOSIS — E7800 Pure hypercholesterolemia, unspecified: Secondary | ICD-10-CM | POA: Diagnosis not present

## 2018-07-11 DIAGNOSIS — M5440 Lumbago with sciatica, unspecified side: Secondary | ICD-10-CM

## 2018-07-11 DIAGNOSIS — G8929 Other chronic pain: Secondary | ICD-10-CM

## 2018-07-11 DIAGNOSIS — E559 Vitamin D deficiency, unspecified: Secondary | ICD-10-CM | POA: Diagnosis not present

## 2018-07-11 DIAGNOSIS — I700 Atherosclerosis of aorta: Secondary | ICD-10-CM | POA: Diagnosis not present

## 2018-07-11 DIAGNOSIS — W57XXXD Bitten or stung by nonvenomous insect and other nonvenomous arthropods, subsequent encounter: Secondary | ICD-10-CM

## 2018-07-11 MED ORDER — MELOXICAM 15 MG PO TABS: ORAL_TABLET | ORAL | 3 refills | Status: DC

## 2018-07-11 MED ORDER — DOXYCYCLINE HYCLATE 100 MG PO TABS: 100.0000 mg | ORAL_TABLET | Freq: Two times a day (BID) | ORAL | 1 refills | Status: DC

## 2018-07-11 NOTE — Progress Notes (Signed)
Subjective:    Patient ID: Paul Parks, male    DOB: May 11, 1936, 82 y.o.   MRN: [id number]  HPI Pt here for follow up and management of chronic medical problems which includes hyperlipidemia. He is taking medication regularly. The patient has had blood work done and this will be reviewed with him during the visit today.  On the liver function test, the total protein was slightly decreased in all other liver function tests were normal.  The CBC was within normal limits and stable other than a slightly decreased platelet count.  The blood sugar was good and the creatinine and electrolytes were all normal.  On a traditional lipid panel, the LDL-C was slightly elevated with good triglycerides and a good HDL.  The patient will be encouraged to stay on his current medicine.  Vitamin D level was good at 37 point.  The patient continues to complain of tick bites and arthralgias.  He takes Mobic and would like a refill on his doxycycline.  His vital signs are stable and his weight is up a couple pounds.  His blood pressure is good.  Patient recently saw Dr. Crissie Sickles because of palpitations.  The cardiologist felt like these were mostly PVCs.  His left ventricular function was preserved by echo.  The patient is doing well overall and has not had that much trouble with palpitations.  He does indicate he works outside a lot and does not drink a lot of water and I encouraged him to drink more water.  He denies any chest pain pressure or tightness.  He denies any trouble with shortness of breath anymore than he would expect.  He is swallowing okay and having no issues with heartburn indigestion nausea vomiting diarrhea or blood in the stool.  He is due to get another colonoscopy next spring.  He is passing his water without problems and sees the urologist yearly.  He does continue to get multiple tick bites as does his wife.  He is very concerned about this and we will give him a prescription for doxycycline for  taking this as needed for tick bites.    Patient Active Problem List   Diagnosis Date Noted  . Abnormal EKG 05/21/2018  . Palpitations 05/21/2018  . Hyperlipidemia   . Aortic atherosclerosis ([place]) 10/05/2017  . Allergic rhinitis 03/30/2016  . Glaucoma 02/18/2015  . Thrombocytopenia ([place]) 09/24/2014  . Degenerative disc disease, lumbar 10/08/2013  . BPH (benign prostatic hypertrophy) 05/30/2013  . Labyrinthitis 05/30/2013  . Hyperlipemia 03/12/2013  . Personal history of colonic polyps - adenomas 04/24/2008   Outpatient Encounter Medications as of 07/11/2018  Medication Sig  . aspirin 81 MG tablet Take 81 mg by mouth daily.  . cholecalciferol (VITAMIN D) 1000 UNITS tablet Take 2,000 Units by mouth daily.   . finasteride (PROSCAR) 5 MG tablet Take 5 mg by mouth 2 (two) times a week.  . fluticasone (FLONASE) 50 MCG/ACT nasal spray Place 1 spray into both nostrils daily.  . meclizine (ANTIVERT) 12.5 MG tablet Take one (1) tablet (12.5 mg) by mouth four (4) times daily with meals and at bedtime for dizziness.  . meloxicam (MOBIC) 15 MG tablet TAKE 1/2 TO 1 TABLET BY MOUTH EVERY DAY AFTER A MEAL.  . Multiple Vitamin (MULTIVITAMIN WITH MINERALS) TABS tablet Take 1 tablet by mouth daily.  . rosuvastatin (CRESTOR) 10 MG tablet Take 10 mg by mouth 2 (two) times a week.  . TRAVATAN Z 0.004 % SOLN ophthalmic solution Place 1  drop into both eyes at bedtime.    No facility-administered encounter medications on file as of 07/11/2018.       Review of Systems  Constitutional: Positive for fatigue.  HENT: Negative.   Eyes: Negative.   Respiratory: Negative.   Cardiovascular: Negative.   Gastrointestinal: Negative.   Endocrine: Negative.   Genitourinary: Negative.   Musculoskeletal: Positive for arthralgias.  Skin: Negative.        Tick bites  Allergic/Immunologic: Negative.   Neurological: Negative.   Hematological: Negative.   Psychiatric/Behavioral: Negative.        Objective:    Physical Exam  Constitutional: He is oriented to person, place, and time. He appears well-developed and well-nourished. No distress.  The patient is pleasant and relaxed and looks much younger than his stated age  HENT:  Head: Normocephalic and atraumatic.  Right Ear: External ear normal.  Left Ear: External ear normal.  Nose: Nose normal.  Mouth/Throat: Oropharynx is clear and moist. No oropharyngeal exudate.  Eyes: Pupils are equal, round, and reactive to light. Conjunctivae and EOM are normal. Right eye exhibits no discharge. Left eye exhibits no discharge. No scleral icterus.  Neck: Normal range of motion. Neck supple. No thyromegaly present.  No bruits thyromegaly or anterior cervical adenopathy  Cardiovascular: Normal rate, regular rhythm, normal heart sounds and intact distal pulses.  No murmur heard. The heart is regular at 56 to 60/min with good pedal pulses  Pulmonary/Chest: Effort normal and breath sounds normal. No respiratory distress. He has no wheezes. He has no rales. He exhibits no tenderness.  Clear anteriorly and posteriorly no axillary adenopathy  Abdominal: Soft. Bowel sounds are normal. He exhibits no mass. There is no tenderness. There is no guarding.  No liver or spleen enlargement no epigastric tenderness good inguinal pulses no abdominal masses or bruits  Genitourinary:  Genitourinary Comments: Followed regularly by the urologist, Dr. Irine Parks  Musculoskeletal: Normal range of motion. He exhibits no edema.  Ongoing back stiffness and discomfort from his arthritis.  Lymphadenopathy:    He has no cervical adenopathy.  Neurological: He is alert and oriented to person, place, and time. He has normal reflexes. No cranial nerve deficit.  Lower extremity reflexes diminished bilaterally but within normal limits.  Skin: Skin is warm and dry. No rash noted.  Psychiatric: He has a normal mood and affect. His behavior is normal. Judgment and thought content normal.    Patient's mood affect and behavior were all normal.  Nursing note and vitals reviewed.  BP (!) 109/57 (BP Location: Left Arm)   Pulse (!) 57   Temp 98.2 F (36.8 C) (Oral)   Ht 5\' 10"  (1.778 m)   Wt 187 lb (84.8 kg)   BMI 26.83 kg/m         Assessment & Plan:  1. Thrombocytopenia ([place]) -Patient continues to have thrombocytopenia in the 138,000 range.  He has no bruising or bleeding tendencies.  2. Pure hypercholesterolemia -All cholesterol numbers with traditional lipid testing were good and at goal.  3. Vitamin D deficiency -We will increase his vitamin D 2000 units and take 4000 units on Saturday and Sunday and 2000 units the rest of the week  4. Aortic atherosclerosis ([facility]) -Continue with statin and as aggressive therapeutic lifestyle changes as possible including diet and exercise  5. Chronic low back pain with sciatica, sciatica laterality unspecified, unspecified back pain laterality -Walk and exercise regularly avoid climbing and falling and keep weight down is much as possible  6.  Degenerative disc disease, lumbar -Continue with Mobic as needed and keep weight under control  7. Tick bite, subsequent encounter -Doxycycline twice daily with food as directed  8. Bradycardia -Follow-up with cardiology as planned  Meds ordered this encounter  Medications  . meloxicam (MOBIC) 15 MG tablet    Sig: TAKE 1/2 TO 1 TABLET BY MOUTH EVERY DAY AFTER A MEAL.    Dispense:  90 tablet    Refill:  3  . doxycycline (VIBRA-TABS) 100 MG tablet    Sig: Take 1 tablet (100 mg total) by mouth 2 (two) times daily. 1 po bid    Dispense:  42 tablet    Refill:  1   Patient Instructions                       Medicare Annual Wellness Visit  [facility] and the medical providers at [facility] strive to bring you the best medical care.  In doing so we not only want to address your current medical conditions and concerns but also to detect new conditions early  and prevent illness, disease and health-related problems.    Medicare offers a yearly Wellness Visit which allows our clinical staff to assess your need for preventative services including immunizations, lifestyle education, counseling to decrease risk of preventable diseases and screening for fall risk and other medical concerns.    This visit is provided free of charge (no copay) for all Medicare recipients. The clinical pharmacists at [facility] have begun to conduct these Wellness Visits which will also include a thorough review of all your medications.    As you primary medical provider recommend that you make an appointment for your Annual Wellness Visit if you have not done so already this year.  You may set up this appointment before you leave today or you may call back ([phone]) and schedule an appointment.  Please make sure when you call that you mention that you are scheduling your Annual Wellness Visit with the clinical pharmacist so that the appointment may be made for the proper length of time.     Continue current medications. Continue good therapeutic lifestyle changes which include good diet and exercise. Fall precautions discussed with patient. If an FOBT was given today- please return it to our front desk. If you are over 52 years old - you may need Prevnar 11 or the adult Pneumonia vaccine.  **Flu shots are available--- please call and schedule a FLU-CLINIC appointment**  After your visit with [name] today you will receive a survey in the mail or online from [employer] regarding your care with [name]. Please take a moment to fill this out. Your feedback is very important to [name] as you can help [name] better understand your patient needs as well as improve your experience and satisfaction. WE CARE ABOUT YOU!!!   Follow-up with urology and cardiology as planned Take doxycycline as directed Do not forget to get your colonoscopy in the spring 2020. When working  outside please continue to drink plenty of water and stay well-hydrated  Arrie Senate MD

## 2018-07-11 NOTE — Patient Instructions (Addendum)
Medicare Annual Wellness Visit  [facility] and the medical providers at [facility] strive to bring you the best medical care.  In doing so we not only want to address your current medical conditions and concerns but also to detect new conditions early and prevent illness, disease and health-related problems.    Medicare offers a yearly Wellness Visit which allows our clinical staff to assess your need for preventative services including immunizations, lifestyle education, counseling to decrease risk of preventable diseases and screening for fall risk and other medical concerns.    This visit is provided free of charge (no copay) for all Medicare recipients. The clinical pharmacists at [facility] have begun to conduct these Wellness Visits which will also include a thorough review of all your medications.    As you primary medical provider recommend that you make an appointment for your Annual Wellness Visit if you have not done so already this year.  You may set up this appointment before you leave today or you may call back ([phone]) and schedule an appointment.  Please make sure when you call that you mention that you are scheduling your Annual Wellness Visit with the clinical pharmacist so that the appointment may be made for the proper length of time.     Continue current medications. Continue good therapeutic lifestyle changes which include good diet and exercise. Fall precautions discussed with patient. If an FOBT was given today- please return it to our front desk. If you are over 27 years old - you may need Prevnar 96 or the adult Pneumonia vaccine.  **Flu shots are available--- please call and schedule a FLU-CLINIC appointment**  After your visit with [name] today you will receive a survey in the mail or online from [employer] regarding your care with [name]. Please take a moment to fill this out. Your feedback is very  important to [name] as you can help [name] better understand your patient needs as well as improve your experience and satisfaction. WE CARE ABOUT YOU!!!   Follow-up with urology and cardiology as planned Take doxycycline as directed Do not forget to get your colonoscopy in the spring 2020. When working outside please continue to drink plenty of water and stay well-hydrated

## 2018-07-13 LAB — VITAMIN B12: VITAMIN B 12: 556 pg/mL (ref 232–1245)

## 2018-07-13 LAB — THYROID PANEL WITH TSH
FREE THYROXINE INDEX: 1.3 (ref 1.2–4.9)
T3 Uptake Ratio: 28 % (ref 24–39)
T4, Total: 4.6 ug/dL (ref 4.5–12.0)
TSH: 2.42 u[IU]/mL (ref 0.450–4.500)

## 2018-07-13 LAB — SPECIMEN STATUS REPORT

## 2018-10-04 ENCOUNTER — Ambulatory Visit (INDEPENDENT_AMBULATORY_CARE_PROVIDER_SITE_OTHER): Payer: Medicare Other

## 2018-10-04 DIAGNOSIS — Z23 Encounter for immunization: Secondary | ICD-10-CM

## 2018-10-17 ENCOUNTER — Ambulatory Visit: Payer: Medicare Other | Admitting: Internal Medicine

## 2018-10-23 ENCOUNTER — Ambulatory Visit: Payer: Medicare Other | Admitting: Internal Medicine

## 2018-10-23 ENCOUNTER — Encounter: Payer: Self-pay | Admitting: Internal Medicine

## 2018-10-23 VITALS — BP 136/70 | HR 56 | Ht 70.0 in | Wt 185.0 lb

## 2018-10-23 DIAGNOSIS — I493 Ventricular premature depolarization: Secondary | ICD-10-CM | POA: Diagnosis not present

## 2018-10-23 DIAGNOSIS — I495 Sick sinus syndrome: Secondary | ICD-10-CM | POA: Diagnosis not present

## 2018-10-23 MED ORDER — AMLODIPINE BESYLATE 2.5 MG PO TABS: 2.5000 mg | ORAL_TABLET | Freq: Every day | ORAL | 3 refills | Status: DC

## 2018-10-23 NOTE — Patient Instructions (Addendum)
Medication Instructions:  Your physician has recommended you make the following change in your medication:   1.  Start taking amlodipine 2.5 mg --Take one tablet by mouth daily.  Labwork: None ordered.  Testing/Procedures: None ordered.  Follow-Up: Your physician wants you to follow-up in: 6 months with Dr. Lovena Le.   You will receive a reminder letter in the mail two months in advance. If you don't receive a letter, please call our office to schedule the follow-up appointment.  Any Other Special Instructions Will Be Listed Below (If Applicable).  If you need a refill on your cardiac medications before your next appointment, please call your pharmacy.    Amlodipine tablets What is this medicine? AMLODIPINE (am LOE di peen) is a calcium-channel blocker. It affects the amount of calcium found in your heart and muscle cells. This relaxes your blood vessels, which can reduce the amount of work the heart has to do. This medicine is used to lower high blood pressure. It is also used to prevent chest pain. This medicine may be used for other purposes; ask your health care provider or pharmacist if you have questions. COMMON BRAND NAME(S): Norvasc What should I tell my health care provider before I take this medicine? They need to know if you have any of these conditions: -heart problems like heart failure or aortic stenosis -liver disease -an unusual or allergic reaction to amlodipine, other medicines, foods, dyes, or preservatives -pregnant or trying to get pregnant -breast-feeding How should I use this medicine? Take this medicine by mouth with a glass of water. Follow the directions on the prescription label. Take your medicine at regular intervals. Do not take more medicine than directed. Talk to your pediatrician regarding the use of this medicine in children. Special care may be needed. This medicine has been used in children as young as 6. Persons over 38 years old may have a  stronger reaction to this medicine and need smaller doses. Overdosage: If you think you have taken too much of this medicine contact a poison control center or emergency room at once. NOTE: This medicine is only for you. Do not share this medicine with others. What if I miss a dose? If you miss a dose, take it as soon as you can. If it is almost time for your next dose, take only that dose. Do not take double or extra doses. What may interact with this medicine? -herbal or dietary supplements -local or general anesthetics -medicines for high blood pressure -medicines for prostate problems -rifampin This list may not describe all possible interactions. Give your health care provider a list of all the medicines, herbs, non-prescription drugs, or dietary supplements you use. Also tell them if you smoke, drink alcohol, or use illegal drugs. Some items may interact with your medicine. What should I watch for while using this medicine? Visit your doctor or health care professional for regular check ups. Check your blood pressure and pulse rate regularly. Ask your health care professional what your blood pressure and pulse rate should be, and when you should contact him or her. This medicine may make you feel confused, dizzy or lightheaded. Do not drive, use machinery, or do anything that needs mental alertness until you know how this medicine affects you. To reduce the risk of dizzy or fainting spells, do not sit or stand up quickly, especially if you are an older patient. Avoid alcoholic drinks; they can make you more dizzy. Do not suddenly stop taking amlodipine. Ask your doctor  or health care professional how you can gradually reduce the dose. What side effects may I notice from receiving this medicine? Side effects that you should report to your doctor or health care professional as soon as possible: -allergic reactions like skin rash, itching or hives, swelling of the face, lips, or  tongue -breathing problems -changes in vision or hearing -chest pain -fast, irregular heartbeat -swelling of legs or ankles Side effects that usually do not require medical attention (report to your doctor or health care professional if they continue or are bothersome): -dry mouth -facial flushing -nausea, vomiting -stomach gas, pain -tired, weak -trouble sleeping This list may not describe all possible side effects. Call your doctor for medical advice about side effects. You may report side effects to FDA at 1-800-FDA-1088. Where should I keep my medicine? Keep out of the reach of children. Store at room temperature between 59 and 86 degrees F (15 and 30 degrees C). Protect from light. Keep container tightly closed. Throw away any unused medicine after the expiration date. NOTE: This sheet is a summary. It may not cover all possible information. If you have questions about this medicine, talk to your doctor, pharmacist, or health care provider.  2018 Elsevier/Gold Standard (2012-10-19 11:40:58)

## 2018-10-23 NOTE — Progress Notes (Signed)
HPI Paul Parks returns today for ongoing followup of his HTN, PVC"s and sinus node dysfunction. He is a pleasant and very active 82 yo man who I saw several months ago. He was asymptomatic and we decided on watchful waiting. He continues to walk daily, and work out in his yard. He gets tired at night after dinner and often falls asleep. He has been checking his bp and hr sporadically and has had no extreme readings. He denies syncope or near syncope but does note that he has to get up slowly in the morning. Allergies  Allergen Reactions  . Sulfa Antibiotics Itching    Rash   . Vesicare [Solifenacin] Other (See Comments)    HA      Current Outpatient Medications  Medication Sig Dispense Refill  . aspirin 81 MG tablet Take 81 mg by mouth daily.    . cholecalciferol (VITAMIN D) 1000 UNITS tablet Take 2,000 Units by mouth daily.     Marland Kitchen doxycycline (VIBRA-TABS) 100 MG tablet Take 1 tablet (100 mg total) by mouth 2 (two) times daily. 1 po bid 42 tablet 1  . finasteride (PROSCAR) 5 MG tablet Take 5 mg by mouth 2 (two) times a week.    . fluticasone (FLONASE) 50 MCG/ACT nasal spray Place 1 spray into both nostrils daily.    . meclizine (ANTIVERT) 12.5 MG tablet Take one (1) tablet (12.5 mg) by mouth four (4) times daily with meals and at bedtime for dizziness.    . meloxicam (MOBIC) 15 MG tablet TAKE 1/2 TO 1 TABLET BY MOUTH EVERY DAY AFTER A MEAL. 90 tablet 3  . Multiple Vitamin (MULTIVITAMIN WITH MINERALS) TABS tablet Take 1 tablet by mouth daily.    . rosuvastatin (CRESTOR) 10 MG tablet Take 10 mg by mouth 2 (two) times a week.    . TRAVATAN Z 0.004 % SOLN ophthalmic solution Place 1 drop into both eyes at bedtime.   11   No current facility-administered medications for this visit.      Past Medical History:  Diagnosis Date  . Allergic rhinitis   . Allergy   . Aortic atherosclerosis ([place])   . Bladder disorder    takes bladder control meds  . BPH (benign prostatic hyperplasia)    . Cataract   . DDD (degenerative disc disease)   . Glaucoma   . Hyperlipemia   . Hyperlipidemia   . Labyrinthitis   . Personal history of colonic polyps   . Skull fracture ([place]) 1955   from [place]   . Thrombocytopenia ([place])   . Vitamin D deficiency     ROS:   All systems reviewed and negative except as noted in the HPI.   Past Surgical History:  Procedure Laterality Date  . COLON SURGERY     polyps   . COLONOSCOPY    . EYE SURGERY Bilateral    cataracts   . left knee surgery- microscopic  Left 80's   [facility]   . 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 TREATMENT FISTULA ANAL       Family History  Problem Relation Age of Onset  . Heart disease Mother   . Diabetes Mother        diet controlled  . Other Mother        tetanus / lock jaw  . Heart disease Brother   . Emphysema Brother        smoker  .  Heart disease Father   . Emphysema Father        smoker  . Glaucoma Father   . Cancer Sister        brain and stomach / colon   . Emphysema Sister        smoker  . Lumbar disc disease Sister   . Hyperlipidemia Sister   . Hypertension Sister   . Heart disease Sister   . Polycythemia Son   . Heart disease Sister   . Hyperlipidemia Son   . Colon cancer Neg Hx      Social History   Socioeconomic History  . Marital status: Married    Spouse name: [name]  . Number of children: 2  . Years of education: Not on file  . Highest education level: Not on file  Occupational History  . 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 Financial resource strain: Not on file  . Food insecurity:    Worry: Not on file    Inability: Not on file  . Transportation needs:    Medical: Not on file    Non-medical: Not on file  Tobacco Use  . Smoking status: Never Smoker  . Smokeless tobacco: Never Used  Substance and Sexual Activity  . Alcohol use: Yes    Alcohol/week: 0.0 standard drinks     Comment: every 2 months 1-2 beers                            . Drug use: No  . Sexual activity: Yes  Lifestyle  . Physical activity:    Days per week: Not on file    Minutes per session: Not on file  . Stress: Not on file  Relationships  . Social connections:    Talks on phone: Not on file    Gets together: Not on file    Attends religious service: Not on file    Active member of club or organization: Not on file    Attends meetings of clubs or organizations: Not on file    Relationship status: Not on file  . Intimate partner violence:    Fear of current or ex partner: Not on file    Emotionally abused: Not on file    Physically abused: Not on file    Forced sexual activity: Not on file  Other Topics Concern  . Not on file  Social History Narrative   Lives at home with wife, [name]      BP 136/70   Pulse (!) 56   Ht 5\' 10"  (1.778 m)   Wt 185 lb (83.9 kg)   SpO2 97%   BMI 26.54 kg/m   Physical Exam:  Well appearing NAD HEENT: Unremarkable Neck:  No JVD, no thyromegally Lymphatics:  No adenopathy Back:  No CVA tenderness Lungs:  Clear with no wheezes HEART:  Regular rate rhythm, no murmurs, no rubs, no clicks Abd:  soft, positive bowel sounds, no organomegally, no rebound, no guarding Ext:  2 plus pulses, no edema, no cyanosis, no clubbing Skin:  No rashes no nodules Neuro:  CN II through XII intact, motor grossly intact   Assess/Plan: 1. Sinus node dysfunction - he is asymptomatic and will undergo watchful waiting and avoid HR slowing meds 2. HTN - his pressure has been a little high. I have started amlodipine 2.5 mg daily. 3. PVC's - he  is asymptomatic. No indication for any additional treatment. 4. orthostasis - I have asked the patient to move slowly. He is symptomatic only in the early morning.   Mikle Bosworth.D.

## 2018-11-20 ENCOUNTER — Encounter: Payer: Self-pay | Admitting: Family Medicine

## 2018-11-20 ENCOUNTER — Ambulatory Visit: Payer: Medicare Other | Admitting: Family Medicine

## 2018-11-20 VITALS — BP 111/58 | HR 56 | Temp 98.6°F | Ht 70.0 in | Wt 184.0 lb

## 2018-11-20 DIAGNOSIS — I700 Atherosclerosis of aorta: Secondary | ICD-10-CM

## 2018-11-20 DIAGNOSIS — E559 Vitamin D deficiency, unspecified: Secondary | ICD-10-CM

## 2018-11-20 DIAGNOSIS — D696 Thrombocytopenia, unspecified: Secondary | ICD-10-CM

## 2018-11-20 DIAGNOSIS — E7800 Pure hypercholesterolemia, unspecified: Secondary | ICD-10-CM

## 2018-11-20 DIAGNOSIS — R5383 Other fatigue: Secondary | ICD-10-CM

## 2018-11-20 MED ORDER — ROSUVASTATIN CALCIUM 10 MG PO TABS: 10.0000 mg | ORAL_TABLET | ORAL | 3 refills | Status: DC

## 2018-11-20 NOTE — Patient Instructions (Addendum)
Medicare Annual Wellness Visit  [facility] and the medical providers at [facility] strive to bring you the best medical care.  In doing so we not only want to address your current medical conditions and concerns but also to detect new conditions early and prevent illness, disease and health-related problems.    Medicare offers a yearly Wellness Visit which allows our clinical staff to assess your need for preventative services including immunizations, lifestyle education, counseling to decrease risk of preventable diseases and screening for fall risk and other medical concerns.    This visit is provided free of charge (no copay) for all Medicare recipients. The clinical pharmacists at [facility] have begun to conduct these Wellness Visits which will also include a thorough review of all your medications.    As you primary medical provider recommend that you make an appointment for your Annual Wellness Visit if you have not done so already this year.  You may set up this appointment before you leave today or you may call back ([phone]) and schedule an appointment.  Please make sure when you call that you mention that you are scheduling your Annual Wellness Visit with the clinical pharmacist so that the appointment may be made for the proper length of time.     Continue current medications. Continue good therapeutic lifestyle changes which include good diet and exercise. Fall precautions discussed with patient. If an FOBT was given today- please return it to our front desk. If you are over 71 years old - you may need Prevnar 95 or the adult Pneumonia vaccine.  **Flu shots are available--- please call and schedule a FLU-CLINIC appointment**  After your visit with [name] today you will receive a survey in the mail or online from [employer] regarding your care with [name]. Please take a moment to fill this out. Your feedback is very  important to [name] as you can help [name] better understand your patient needs as well as improve your experience and satisfaction. WE CARE ABOUT YOU!!!   Continue to be careful and do not pick up anything heavy if you can avoid it If the back pain continues to recur and persist get back in touch with [name] and we will get some additional x-rays Take as little meloxicam as possible We will call with lab work results to include B12 and thyroid as soon as those results become available

## 2018-11-20 NOTE — Progress Notes (Signed)
Subjective:    Patient ID: Paul Parks, male    DOB: August 05, 1936, 82 y.o.   MRN: [id number]  HPI Pt here for follow up and management of chronic medical problems which includes hyperlipidemia. He is taking medication regularly.  Patient comes in today for his regular checkup and has no specific complaints.  He sees the urologist regularly.  He will be given an FOBT to return and get lab work today.  He is requesting a refill on his Crestor.  His vital signs are stable and his weight is stable.  His body mass index is just a little bit above 25.  Patient has a history of chronic back pain secondary to osteoarthritis.  He sees the urologist for his BPH.  Takes amlodipine 2.5 mg along with Proscar for his BPH vitamin D at thousand units meclizine as needed for dizziness meloxicam as needed for back pain Crestor 10 mg and a multivitamin.  She is doing well overall.  She indicates that she lifted a heavy [place] from a low position and hurt her back and had severe back pain for about a week but this is much better.  Other than the back pain that has improved she denies any chest pain or shortness of breath.  She denies any problems with her stomach including nausea vomiting diarrhea blood in the stool or black tarry bowel movements.  She is passing her water without problems.  She has had some sinus congestion and feels like the Claritin was not working for this but she takes a fourth of a maximum strength Mucinex and this seems to work and she will continue to do this and will continue to drink plenty of fluids and stay well-hydrated.  We will refill her Crestor and her Flexeril.    Patient Active Problem List   Diagnosis Date Noted  . Abnormal EKG 05/21/2018  . Palpitations 05/21/2018  . Hyperlipidemia   . Aortic atherosclerosis ([place]) 10/05/2017  . Allergic rhinitis 03/30/2016  . Glaucoma 02/18/2015  . Thrombocytopenia ([place]) 09/24/2014  . Degenerative disc disease, lumbar 10/08/2013  . BPH (benign  prostatic hypertrophy) 05/30/2013  . Labyrinthitis 05/30/2013  . Hyperlipemia 03/12/2013  . Personal history of colonic polyps - adenomas 04/24/2008   Outpatient Encounter Medications as of 11/20/2018  Medication Sig  . amLODipine (NORVASC) 2.5 MG tablet Take 1 tablet (2.5 mg total) by mouth daily.  Marland Kitchen aspirin 81 MG tablet Take 81 mg by mouth daily.  . cholecalciferol (VITAMIN D) 1000 UNITS tablet Take 2,000 Units by mouth daily.   . finasteride (PROSCAR) 5 MG tablet Take 5 mg by mouth 2 (two) times a week.  . fluticasone (FLONASE) 50 MCG/ACT nasal spray Place 1 spray into both nostrils daily.  . meclizine (ANTIVERT) 12.5 MG tablet Take one (1) tablet (12.5 mg) by mouth four (4) times daily with meals and at bedtime for dizziness.  . meloxicam (MOBIC) 15 MG tablet TAKE 1/2 TO 1 TABLET BY MOUTH EVERY DAY AFTER A MEAL.  . Multiple Vitamin (MULTIVITAMIN WITH MINERALS) TABS tablet Take 1 tablet by mouth daily.  . rosuvastatin (CRESTOR) 10 MG tablet Take 10 mg by mouth 2 (two) times a week.  . TRAVATAN Z 0.004 % SOLN ophthalmic solution Place 1 drop into both eyes at bedtime.   . [DISCONTINUED] doxycycline (VIBRA-TABS) 100 MG tablet Take 1 tablet (100 mg total) by mouth 2 (two) times daily. 1 po bid   No facility-administered encounter medications on file as of 11/20/2018.  Review of Systems  Constitutional: Negative.   HENT: Negative.   Eyes: Negative.   Respiratory: Negative.   Cardiovascular: Negative.   Gastrointestinal: Negative.   Endocrine: Negative.   Genitourinary: Negative.   Musculoskeletal: Negative.   Skin: Negative.   Allergic/Immunologic: Negative.   Neurological: Negative.   Hematological: Negative.   Psychiatric/Behavioral: Negative.        Objective:   Physical Exam Vitals signs and nursing note reviewed.  Constitutional:      Appearance: Normal appearance. He is well-developed and normal weight.     Comments: Patient is pleasant and slightly uptight  today after getting over this bout with back pain and worrying about the heart disease runs in her family.  She does see the cardiologist regularly and she has been much more oriented toward prevention than 1 of her siblings.  HENT:     Head: Normocephalic and atraumatic.     Right Ear: Tympanic membrane, ear canal and external ear normal. There is no impacted cerumen.     Left Ear: Tympanic membrane, ear canal and external ear normal. There is no impacted cerumen.     Nose: Nose normal. No congestion.     Mouth/Throat:     Mouth: Mucous membranes are moist.     Pharynx: Oropharynx is clear. No oropharyngeal exudate or posterior oropharyngeal erythema.  Eyes:     General: No scleral icterus.       Right eye: No discharge.        Left eye: No discharge.     Extraocular Movements: Extraocular movements intact.     Conjunctiva/sclera: Conjunctivae normal.     Pupils: Pupils are equal, round, and reactive to light.  Neck:     Musculoskeletal: Normal range of motion and neck supple.     Thyroid: No thyromegaly.     Vascular: No carotid bruit.     Trachea: No tracheal deviation.     Comments: No bruits thyromegaly or anterior cervical adenopathy Cardiovascular:     Rate and Rhythm: Normal rate and regular rhythm.     Pulses: Normal pulses.     Heart sounds: Normal heart sounds. No murmur.     Comments: Heart is regular at 60/min with good pedal pulses and no edema Pulmonary:     Effort: Pulmonary effort is normal.     Breath sounds: Normal breath sounds. No wheezing or rales.     Comments: Clear anteriorly and posteriorly Abdominal:     General: Abdomen is flat. Bowel sounds are normal.     Palpations: Abdomen is soft. There is no mass.     Tenderness: There is no abdominal tenderness. There is no guarding.     Comments: Abdominal scar without masses tenderness or organ enlargement  Genitourinary:    Comments: No hernia palpated. Musculoskeletal: Normal range of motion.         General: No tenderness.     Comments: Leg raising is good bilaterally without pain in the back  Lymphadenopathy:     Cervical: No cervical adenopathy.  Skin:    General: Skin is warm and dry.     Findings: No rash.  Neurological:     General: No focal deficit present.     Mental Status: He is alert and oriented to person, place, and time.     Cranial Nerves: No cranial nerve deficit.     Deep Tendon Reflexes: Reflexes are normal and symmetric.  Psychiatric:        Mood and Affect:  Mood normal.        Behavior: Behavior normal.        Thought Content: Thought content normal.        Judgment: Judgment normal.     Comments: Good affect and behavior are normal for this patient    BP (!) 111/58 (BP Location: Left Arm)   Pulse (!) 56   Temp 98.6 F (37 C) (Oral)   Ht 5' 10"  (1.778 m)   Wt 184 lb (83.5 kg)   BMI 26.40 kg/m         Assessment & Plan:  1. Aortic atherosclerosis ([place]) -Continue with current treatment and aggressive therapeutic lifestyle changes - BMP8+EGFR - CBC with Differential/Platelet - Lipid panel - Hepatic function panel  2. Pure hypercholesterolemia -Continue with Crestor and as aggressive therapeutic lifestyle changes as possible pending results of lab work - CBC with Differential/Platelet - Lipid panel  3. Thrombocytopenia ([place]) -No history of any bleeding issues. - CBC with Differential/Platelet  4. Vitamin D deficiency -Continue vitamin D replacement pending results of lab work - CBC with Differential/Platelet - VITAMIN D 25 Hydroxy (Vit-D Deficiency, Fractures)  5. Other fatigue - Vitamin B12 - Thyroid Panel With TSH  Meds ordered this encounter  Medications  . rosuvastatin (CRESTOR) 10 MG tablet    Sig: Take 1 tablet (10 mg total) by mouth 2 (two) times a week.    Dispense:  25 tablet    Refill:  3   Patient Instructions                       Medicare Annual Wellness Visit  [facility] and the medical providers at [facility] strive to bring you the best medical care.  In doing so we not only want to address your current medical conditions and concerns but also to detect new conditions early and prevent illness, disease and health-related problems.    Medicare offers a yearly Wellness Visit which allows our clinical staff to assess your need for preventative services including immunizations, lifestyle education, counseling to decrease risk of preventable diseases and screening for fall risk and other medical concerns.    This visit is provided free of charge (no copay) for all Medicare recipients. The clinical pharmacists at [facility] have begun to conduct these Wellness Visits which will also include a thorough review of all your medications.    As you primary medical provider recommend that you make an appointment for your Annual Wellness Visit if you have not done so already this year.  You may set up this appointment before you leave today or you may call back ([phone]) and schedule an appointment.  Please make sure when you call that you mention that you are scheduling your Annual Wellness Visit with the clinical pharmacist so that the appointment may be made for the proper length of time.     Continue current medications. Continue good therapeutic lifestyle changes which include good diet and exercise. Fall precautions discussed with patient. If an FOBT was given today- please return it to our front desk. If you are over 23 years old - you may need Prevnar 43 or the adult Pneumonia vaccine.  **Flu shots are available--- please call and schedule a FLU-CLINIC appointment**  After your visit with [name] today you will receive a survey in the mail or online from [employer] regarding your care with [name]. Please take a moment to fill this out. Your feedback  is very important to [name] as you can help [name] better understand your patient needs as well as improve your experience and  satisfaction. WE CARE ABOUT YOU!!!   Continue to be careful and do not pick up anything heavy if you can avoid it If the back pain continues to recur and persist get back in touch with [name] and we will get some additional x-rays Take as little meloxicam as possible We will call with lab work results to include B12 and thyroid as soon as those results become available  Arrie Senate MD

## 2018-11-21 LAB — BMP8+EGFR
BUN / CREAT RATIO: 20 (ref 10–24)
BUN: 23 mg/dL (ref 8–27)
CO2: 22 mmol/L (ref 20–29)
CREATININE: 1.17 mg/dL (ref 0.76–1.27)
Calcium: 9.1 mg/dL (ref 8.6–10.2)
Chloride: 102 mmol/L (ref 96–106)
GFR calc Af Amer: 68 mL/min/{1.73_m2} (ref >59)
GFR, EST NON AFRICAN AMERICAN: 59 mL/min/{1.73_m2} — AB (ref >59)
GLUCOSE: 87 mg/dL (ref 65–99)
Potassium: 4.4 mmol/L (ref 3.5–5.2)
Sodium: 137 mmol/L (ref 134–144)

## 2018-11-21 LAB — CBC WITH DIFFERENTIAL/PLATELET
BASOS: 0 %
Basophils Absolute: 0 10*3/uL (ref 0.0–0.2)
EOS (ABSOLUTE): 0.1 10*3/uL (ref 0.0–0.4)
Eos: 2 %
HEMATOCRIT: 38.1 % (ref 37.5–51.0)
HEMOGLOBIN: 13.2 g/dL (ref 13.0–17.7)
IMMATURE GRANULOCYTES: 0 %
Immature Grans (Abs): 0 10*3/uL (ref 0.0–0.1)
Lymphocytes Absolute: 1.7 10*3/uL (ref 0.7–3.1)
Lymphs: 36 %
MCH: 31.2 pg (ref 26.6–33.0)
MCHC: 34.6 g/dL (ref 31.5–35.7)
MCV: 90 fL (ref 79–97)
MONOCYTES: 6 %
Monocytes Absolute: 0.3 10*3/uL (ref 0.1–0.9)
NEUTROS PCT: 56 %
Neutrophils Absolute: 2.6 10*3/uL (ref 1.4–7.0)
Platelets: 135 10*3/uL — ABNORMAL LOW (ref 150–450)
RBC: 4.23 x10E6/uL (ref 4.14–5.80)
RDW: 12.6 % (ref 12.3–15.4)
WBC: 4.8 10*3/uL (ref 3.4–10.8)

## 2018-11-21 LAB — THYROID PANEL WITH TSH
FREE THYROXINE INDEX: 1.5 (ref 1.2–4.9)
T3 Uptake Ratio: 28 % (ref 24–39)
T4, Total: 5.2 ug/dL (ref 4.5–12.0)
TSH: 1.9 u[IU]/mL (ref 0.450–4.500)

## 2018-11-21 LAB — LIPID PANEL
Chol/HDL Ratio: 2.2 ratio (ref 0.0–5.0)
Cholesterol, Total: 146 mg/dL (ref 100–199)
HDL: 66 mg/dL (ref >39)
LDL CALC: 69 mg/dL (ref 0–99)
TRIGLYCERIDES: 55 mg/dL (ref 0–149)
VLDL CHOLESTEROL CAL: 11 mg/dL (ref 5–40)

## 2018-11-21 LAB — HEPATIC FUNCTION PANEL
ALT: 14 IU/L (ref 0–44)
AST: 24 IU/L (ref 0–40)
Albumin: 4.3 g/dL (ref 3.5–4.7)
Alkaline Phosphatase: 48 IU/L (ref 39–117)
Bilirubin Total: 0.7 mg/dL (ref 0.0–1.2)
Bilirubin, Direct: 0.23 mg/dL (ref 0.00–0.40)
Total Protein: 6.3 g/dL (ref 6.0–8.5)

## 2018-11-21 LAB — VITAMIN B12: VITAMIN B 12: 635 pg/mL (ref 232–1245)

## 2018-11-21 LAB — VITAMIN D 25 HYDROXY (VIT D DEFICIENCY, FRACTURES): Vit D, 25-Hydroxy: 49.2 ng/mL (ref 30.0–100.0)

## 2018-12-31 ENCOUNTER — Encounter: Payer: Self-pay | Admitting: Cardiovascular Disease

## 2019-01-04 ENCOUNTER — Telehealth: Payer: Self-pay | Admitting: Internal Medicine

## 2019-01-04 DIAGNOSIS — I712 Thoracic aortic aneurysm, without rupture, unspecified: Secondary | ICD-10-CM

## 2019-01-04 NOTE — Telephone Encounter (Signed)
Pt called to report that he received a recall notification for a 6 month repeat Echo... Since June 2019 Pt says he has talked with Dr. Lovena Le and reports that he agreed to take over all of his cardiac care... pt is not wanting to schedule the Echo until he hears that Dr. Lovena Le is okay with him having it.Paul Parks He says that he does not want to just go by Dr. Blenda Mounts previous order although I explained him the need to follow up but he would like to know Dr. Lovena Le himself wants it.  I advised him that I will forward to Dr. Lovena Le for his review.  Pt seeing Dr. Lovena Le 04/2019.

## 2019-01-04 NOTE — Telephone Encounter (Signed)
Patient called stating he received a called from our office to schedule a test that Dr. Oval Linsey scheduled.  He wants to know why would Dr. Oval Linsey order a test for him when Dr. Lovena Le is his cardiologist.

## 2019-01-08 NOTE — Telephone Encounter (Signed)
Please schedule the echo. I will see if he prefers.

## 2019-01-09 NOTE — Telephone Encounter (Signed)
Per Dr. Legrand Rams CT of thoracic aorta to follow size of ascending aorta.  Schedule in May.  Cancelled ECHO.  Placed order for CT.  Left detailed message for Pt advising of above.

## 2019-01-09 NOTE — Addendum Note (Signed)
Addended by: Willeen Cass A on: 01/09/2019 02:22 PM   Modules accepted: Orders

## 2019-01-16 ENCOUNTER — Other Ambulatory Visit: Payer: Self-pay

## 2019-01-16 DIAGNOSIS — I712 Thoracic aortic aneurysm, without rupture, unspecified: Secondary | ICD-10-CM

## 2019-01-16 NOTE — Progress Notes (Signed)
BMET ordered pre procedure.

## 2019-03-22 ENCOUNTER — Ambulatory Visit: Payer: Medicare Other | Admitting: Family Medicine

## 2019-04-01 ENCOUNTER — Encounter: Payer: Self-pay | Admitting: Internal Medicine

## 2019-04-16 ENCOUNTER — Other Ambulatory Visit: Payer: Medicare Other

## 2019-04-18 ENCOUNTER — Telehealth: Payer: Self-pay | Admitting: Internal Medicine

## 2019-04-18 NOTE — Telephone Encounter (Signed)
New message    Spoke w/ pt about appt on 05.20.20. Pt will do telephone call with Dr. Lovena Le. Pt phone number is listed in appt notes.      Virtual Visit Pre-Appointment Phone Call  "(Name), I am calling you today to discuss your upcoming appointment. We are currently trying to limit exposure to the virus that causes COVID-19 by seeing patients at home rather than in the office."  1. "What is the BEST phone number to call the day of the visit?" - include this in appointment notes  2. Do you have or have access to (through a family member/friend) a smartphone with video capability that we can use for your visit?" a. If yes - list this number in appt notes as cell (if different from BEST phone #) and list the appointment type as a VIDEO visit in appointment notes b. If no - list the appointment type as a PHONE visit in appointment notes  3. Confirm consent - "In the setting of the current Covid19 crisis, you are scheduled for a (phone or video) visit with your provider on (date) at (time).  Just as we do with many in-office visits, in order for you to participate in this visit, we must obtain consent.  If you'd like, I can send this to your mychart (if signed up) or email for you to review.  Otherwise, I can obtain your verbal consent now.  All virtual visits are billed to your insurance company just like a normal visit would be.  By agreeing to a virtual visit, we'd like you to understand that the technology does not allow for your provider to perform an examination, and thus may limit your provider's ability to fully assess your condition. If your provider identifies any concerns that need to be evaluated in person, we will make arrangements to do so.  Finally, though the technology is pretty good, we cannot assure that it will always work on either your or our end, and in the setting of a video visit, we may have to convert it to a phone-only visit.  In either situation, we cannot ensure that  we have a secure connection.  Are you willing to proceed?" STAFF: Did the patient verbally acknowledge consent to telehealth visit? Document YES/NO here: YES  4. Advise patient to be prepared - "Two hours prior to your appointment, go ahead and check your blood pressure, pulse, oxygen saturation, and your weight (if you have the equipment to check those) and write them all down. When your visit starts, your provider will ask you for this information. If you have an Apple Watch or Kardia device, please plan to have heart rate information ready on the day of your appointment. Please have a pen and paper handy nearby the day of the visit as well."  5. Give patient instructions for MyChart download to smartphone OR Doximity/Doxy.me as below if video visit (depending on what platform provider is using)  6. Inform patient they will receive a phone call 15 minutes prior to their appointment time (may be from unknown caller ID) so they should be prepared to answer    TELEPHONE CALL NOTE  Paul Parks has been deemed a candidate for a follow-up tele-health visit to limit community exposure during the Covid-19 pandemic. I spoke with the patient via phone to ensure availability of phone/video source, confirm preferred email & phone number, and discuss instructions and expectations.  I reminded Paul Parks to be prepared with any vital sign and/or  heart rhythm information that could potentially be obtained via home monitoring, at the time of his visit. I reminded Paul Parks to expect a phone call prior to his visit.  Paul Parks 04/18/2019 10:35 AM   INSTRUCTIONS FOR DOWNLOADING THE MYCHART APP TO SMARTPHONE  - The patient must first make sure to have activated MyChart and know their login information - If Apple, go to [employer] and type in MyChart in the search bar and download the app. If Android, ask patient to go to [employer] and type in [place] in the search bar and download the  app. The app is free but as with any other app downloads, their phone may require them to verify saved payment information or Apple/Android password.  - The patient will need to then log into the app with their MyChart username and password, and select [facility] as their healthcare provider to link the account. When it is time for your visit, go to the MyChart app, find appointments, and click Begin Video Visit. Be sure to Select Allow for your device to access the Microphone and Camera for your visit. You will then be connected, and your provider will be with you shortly.  **If they have any issues connecting, or need assistance please contact MyChart service desk [phone])**  **If using a computer, in order to ensure the best quality for their visit they will need to use either of the following Internet Browsers: [employer], or Google Chrome**  IF USING DOXIMITY or DOXY.ME - The patient will receive a link just prior to their visit by text.     FULL LENGTH CONSENT FOR TELE-HEALTH VISIT   I hereby voluntarily request, consent and authorize [facility] and its employed or contracted physicians, physician assistants, nurse practitioners or other licensed health care professionals (the Practitioner), to provide me with telemedicine health care services (the Services") as deemed necessary by the treating Practitioner. I acknowledge and consent to receive the Services by the Practitioner via telemedicine. I understand that the telemedicine visit will involve communicating with the Practitioner through live audiovisual communication technology and the disclosure of certain medical information by electronic transmission. I acknowledge that I have been given the opportunity to request an in-person assessment or other available alternative prior to the telemedicine visit and am voluntarily participating in the telemedicine visit.  I understand that I have the right to withhold or  withdraw my consent to the use of telemedicine in the course of my care at any time, without affecting my right to future care or treatment, and that the Practitioner or I may terminate the telemedicine visit at any time. I understand that I have the right to inspect all information obtained and/or recorded in the course of the telemedicine visit and may receive copies of available information for a reasonable fee.  I understand that some of the potential risks of receiving the Services via telemedicine include:   Delay or interruption in medical evaluation due to technological equipment failure or disruption;  Information transmitted may not be sufficient (e.g. poor resolution of images) to allow for appropriate medical decision making by the Practitioner; and/or   In rare instances, security protocols could fail, causing a breach of personal health information.  Furthermore, I acknowledge that it is my responsibility to provide information about my medical history, conditions and care that is complete and accurate to the best of my ability. I acknowledge that Practitioner's advice, recommendations, and/or decision may be based on  factors not within their control, such as incomplete or inaccurate data provided by me or distortions of diagnostic images or specimens that may result from electronic transmissions. I understand that the practice of medicine is not an exact science and that Practitioner makes no warranties or guarantees regarding treatment outcomes. I acknowledge that I will receive a copy of this consent concurrently upon execution via email to the email address I last provided but may also request a printed copy by calling the office of [facility].    I understand that my insurance will be billed for this visit.   I have read or had this consent read to me.  I understand the contents of this consent, which adequately explains the benefits and risks of the Services being provided via  telemedicine.   I have been provided ample opportunity to ask questions regarding this consent and the Services and have had my questions answered to my satisfaction.  I give my informed consent for the services to be provided through the use of telemedicine in my medical care  By participating in this telemedicine visit I agree to the above.

## 2019-04-23 ENCOUNTER — Other Ambulatory Visit: Payer: Self-pay

## 2019-04-23 ENCOUNTER — Other Ambulatory Visit: Payer: Medicare Other

## 2019-04-23 ENCOUNTER — Ambulatory Visit: Payer: Medicare Other | Admitting: Internal Medicine

## 2019-04-23 ENCOUNTER — Encounter (INDEPENDENT_AMBULATORY_CARE_PROVIDER_SITE_OTHER): Payer: Self-pay

## 2019-04-23 ENCOUNTER — Telehealth: Payer: Self-pay | Admitting: *Deleted

## 2019-04-23 DIAGNOSIS — I712 Thoracic aortic aneurysm, without rupture, unspecified: Secondary | ICD-10-CM

## 2019-04-23 LAB — BASIC METABOLIC PANEL
BUN/Creatinine Ratio: 17 (ref 10–24)
BUN: 19 mg/dL (ref 8–27)
CO2: 32 mmol/L — ABNORMAL HIGH (ref 20–29)
Calcium: 9.5 mg/dL (ref 8.6–10.2)
Chloride: 99 mmol/L (ref 96–106)
Creatinine, Ser: 1.14 mg/dL (ref 0.76–1.27)
GFR calc Af Amer: 70 mL/min/{1.73_m2} (ref >59)
GFR calc non Af Amer: 60 mL/min/{1.73_m2} (ref >59)
Glucose: 95 mg/dL (ref 65–99)
Potassium: 4.9 mmol/L (ref 3.5–5.2)
Sodium: 134 mmol/L (ref 134–144)

## 2019-04-23 NOTE — Telephone Encounter (Signed)
COVID-19 Pre-Screening Questions:  In the past 7 to 10 days have you had a cough, shortness of breath, headache, congestion, fever, body aches, chills, sore throat, or sudden loss of taste or sense of smell?no  Have you been around anyone with known Covid 19.no  Have you been around anyone who is awaiting Covid 19 test results in the past 7 to 10 days?no  Have you been around anyone who has been exposed to Covid 19, or has mentioned symptoms of Covid 19 within the past 7 to 10 days?no  If you have any concerns about symptoms your patients report please contact your leadership team, or the provider the patient is seeing in the office for further guidance.

## 2019-04-24 ENCOUNTER — Telehealth (INDEPENDENT_AMBULATORY_CARE_PROVIDER_SITE_OTHER): Payer: Medicare Other | Admitting: Internal Medicine

## 2019-04-24 ENCOUNTER — Ambulatory Visit (INDEPENDENT_AMBULATORY_CARE_PROVIDER_SITE_OTHER)
Admission: RE | Admit: 2019-04-24 | Discharge: 2019-04-24 | Disposition: A | Discharge status: Home / Self Care | Payer: Medicare Other | Source: Ambulatory Visit | Attending: Internal Medicine | Admitting: Internal Medicine

## 2019-04-24 DIAGNOSIS — I712 Thoracic aortic aneurysm, without rupture, unspecified: Secondary | ICD-10-CM

## 2019-04-24 DIAGNOSIS — I495 Sick sinus syndrome: Secondary | ICD-10-CM

## 2019-04-24 DIAGNOSIS — I493 Ventricular premature depolarization: Secondary | ICD-10-CM | POA: Diagnosis not present

## 2019-04-24 DIAGNOSIS — I10 Essential (primary) hypertension: Secondary | ICD-10-CM | POA: Diagnosis not present

## 2019-04-24 MED ORDER — IOHEXOL 350 MG/ML SOLN
100.0000 mL | Freq: Once | INTRAVENOUS | Status: AC | PRN
  Administered 2019-04-24: 100 mL via INTRAVENOUS

## 2019-04-24 NOTE — Progress Notes (Signed)
Electrophysiology TeleHealth Note   Due to national recommendations of social distancing due to COVID 19, an audio/video telehealth visit is felt to be most appropriate for this patient at this time.  See MyChart message from today for the patient's consent to telehealth for [facility].   Date:  04/24/2019   ID:  Paul Parks, DOB January 28, 1936, MRN [id number]  Location: patient's home  Provider location: [address] [place]  Evaluation Performed: Follow-up visit  PCP:  Chipper Herb, MD  Cardiologist:  No primary care provider on file.  Electrophysiologist:  Dr Lovena Le  Chief Complaint:  "I being doing ok."  History of Present Illness:    [name] is a 83 y.o. male who presents via audio/video conferencing for a telehealth visit today. He is a pleasant 83 yo man with sinus node dysfunction, PVC's and HTN.  Since last being seen in our clinic, the patient reports doing very well.  Today, he denies symptoms of palpitations, chest pain, shortness of breath,  lower extremity edema, dizziness, presyncope, or syncope.  The patient is otherwise without complaint today.  The patient denies symptoms of fevers, chills, cough, or new SOB worrisome for COVID 19.  Past Medical History:  Diagnosis Date  . Allergic rhinitis   . Allergy   . Aortic atherosclerosis ([place])   . Bladder disorder    takes bladder control meds  . BPH (benign prostatic hyperplasia)   . Cataract   . DDD (degenerative disc disease)   . Glaucoma   . Hyperlipemia   . Hyperlipidemia   . Labyrinthitis   . Personal history of colonic polyps   . Skull fracture ([place]) 1955   from [place]   . Thrombocytopenia ([place])   . Vitamin D deficiency     Past Surgical History:  Procedure Laterality Date  . COLON SURGERY     polyps   . COLONOSCOPY    . EYE SURGERY Bilateral    cataracts   . left knee surgery- microscopic  Left 80's   [facility]   . 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 TREATMENT FISTULA ANAL      Current Outpatient Medications  Medication Sig Dispense Refill  . amLODipine (NORVASC) 2.5 MG tablet Take 1 tablet (2.5 mg total) by mouth daily. 90 tablet 3  . aspirin 81 MG tablet Take 81 mg by mouth daily.    . cholecalciferol (VITAMIN D) 1000 UNITS tablet Take 2,000 Units by mouth daily.     . finasteride (PROSCAR) 5 MG tablet Take 5 mg by mouth 2 (two) times a week.    . fluticasone (FLONASE) 50 MCG/ACT nasal spray Place 1 spray into both nostrils daily.    . meclizine (ANTIVERT) 12.5 MG tablet Take one (1) tablet (12.5 mg) by mouth four (4) times daily with meals and at bedtime for dizziness.    . meloxicam (MOBIC) 15 MG tablet TAKE 1/2 TO 1 TABLET BY MOUTH EVERY DAY AFTER A MEAL. 90 tablet 3  . Multiple Vitamin (MULTIVITAMIN WITH MINERALS) TABS tablet Take 1 tablet by mouth daily.    . rosuvastatin (CRESTOR) 10 MG tablet Take 1 tablet (10 mg total) by mouth 2 (two) times a week. 25 tablet 3  . TRAVATAN Z 0.004 % SOLN ophthalmic solution Place 1 drop into both eyes at bedtime.   11   No current facility-administered medications for this visit.     Allergies:  Sulfa antibiotics and Vesicare [solifenacin]   Social History:  The patient  reports that he has never smoked. He has never used smokeless tobacco. He reports current alcohol use. He reports that he does not use drugs.   Family History:  The patient's  family history includes Cancer in his sister; Diabetes in his mother; Emphysema in his brother, father, and sister; Glaucoma in his father; Heart disease in his brother, father, mother, sister, and sister; Hyperlipidemia in his sister and son; Hypertension in his sister; Lumbar disc disease in his sister; Other in his mother; Polycythemia in his son.   ROS:  Please see the history of present illness.   All other systems are personally reviewed and negative.    Exam:    Vital Signs:  P - 56,  BP - 132/60  Well appearing, alert and  conversant, regular work of breathing,  good skin color Eyes- anicteric, neuro- grossly intact, skin- no apparent rash or lesions or cyanosis, mouth- oral mucosa is pink   Labs/Other Tests and Data Reviewed:    Recent Labs: 11/20/2018: ALT 14; Hemoglobin 13.2; Platelets 135; TSH 1.900 04/23/2019: BUN 19; Creatinine, Ser 1.14; Potassium 4.9; Sodium 134   Wt Readings from Last 3 Encounters:  11/20/18 184 lb (83.5 kg)  10/23/18 185 lb (83.9 kg)  07/11/18 187 lb (84.8 kg)     Other studies personally reviewed: Additional studies/ records that were reviewed today include: none   ASSESSMENT & PLAN:    1.  Sinus node dysfunction - he is asymptomatic and HR's appear to be ok. 2. PVC"s - he is asymptomatic. No additional workup. 3. HTN - his bp is a little high initially when he checks it. Then it goes down. 4. COVID 19 screen The patient denies symptoms of COVID 19 at this time.  The importance of social distancing was discussed today.  Follow-up:  6 months Next remote: n/a  Current medicines are reviewed at length with the patient today.   The patient does not have concerns regarding his medicines.  The following changes were made today:  none  Labs/ tests ordered today include: none No orders of the defined types were placed in this encounter.    Patient Risk:  after full review of this patients clinical status, I feel that they are at moderate risk at this time.  Today, I have spent 15 minutes with the patient with telehealth technology discussing all of above .    Signed, [name], MD  04/24/2019 9:14 AM     [facility] [address] [place]  [address] [phone] (office) [phone] (fax)

## 2019-05-01 ENCOUNTER — Telehealth: Payer: Self-pay | Admitting: Internal Medicine

## 2019-05-01 NOTE — Telephone Encounter (Signed)
Spoke with the patient's wife, the patient requested his results be mailed. Letter placed in the mail.

## 2019-05-01 NOTE — Telephone Encounter (Signed)
Follow Up:    Pt says he would like his CT results from 04-24-19 please.

## 2019-05-08 ENCOUNTER — Telehealth: Payer: Self-pay | Admitting: Family Medicine

## 2019-06-13 ENCOUNTER — Telehealth: Payer: Self-pay | Admitting: Family Medicine

## 2019-06-14 ENCOUNTER — Other Ambulatory Visit: Payer: Self-pay

## 2019-06-14 ENCOUNTER — Ambulatory Visit (INDEPENDENT_AMBULATORY_CARE_PROVIDER_SITE_OTHER): Payer: Medicare Other | Admitting: Family Medicine

## 2019-06-14 VITALS — BP 142/71 | HR 58 | Temp 97.8°F | Ht 70.0 in | Wt 176.0 lb

## 2019-06-14 DIAGNOSIS — D696 Thrombocytopenia, unspecified: Secondary | ICD-10-CM | POA: Diagnosis not present

## 2019-06-14 DIAGNOSIS — I10 Essential (primary) hypertension: Secondary | ICD-10-CM | POA: Diagnosis not present

## 2019-06-14 DIAGNOSIS — K4090 Unilateral inguinal hernia, without obstruction or gangrene, not specified as recurrent: Secondary | ICD-10-CM

## 2019-06-14 DIAGNOSIS — I700 Atherosclerosis of aorta: Secondary | ICD-10-CM | POA: Diagnosis not present

## 2019-06-14 DIAGNOSIS — E7800 Pure hypercholesterolemia, unspecified: Secondary | ICD-10-CM

## 2019-06-14 MED ORDER — MELOXICAM 15 MG PO TABS: ORAL_TABLET | ORAL | 3 refills | Status: DC

## 2019-06-14 NOTE — Patient Instructions (Signed)
You had labs performed today.  You will be contacted with the results of the labs once they are available, usually in the next 3 business days for routine lab work.  If you have an active my chart account, they will be released to your MyChart.  If you prefer to have these labs released to you via telephone, please let us know.  If you had a pap smear or biopsy performed, expect to be contacted in about 7-10 days.   Hernia, Adult     A hernia is the bulging of an organ or tissue through a weak spot in the muscles of the abdomen (abdominal wall). Hernias develop most often near the belly button (navel) or the area where the leg meets the lower abdomen (groin). Common types of hernias include:  Incisional hernia. This type bulges through a scar from an abdominal surgery.  Umbilical hernia. This type develops near the navel.  Inguinal hernia. This type develops in the groin or scrotum.  Femoral hernia. This type develops under the groin, in the upper thigh area.  Hiatal hernia. This type occurs when part of the stomach slides above the muscle that separates the abdomen from the chest (diaphragm). What are the causes? This condition may be caused by:  Heavy lifting.  Coughing over a long period of time.  Straining to have a bowel movement. Constipation can lead to straining.  An incision made during an abdominal surgery.  A physical problem that is present at birth (congenital defect).  Being overweight or obese.  Smoking.  Excess fluid in the abdomen.  Undescended testicles in males. What are the signs or symptoms? The main symptom is a skin-colored, rounded bulge in the area of the hernia. However, a bulge may not always be present. It may grow bigger or be more visible when you cough or strain (such as when lifting something heavy). A hernia that can be pushed back into the area (is reducible) rarely causes pain. A hernia that cannot be pushed back into the area (is  incarcerated) may lose its blood supply (become strangulated). A hernia that is incarcerated may cause:  Pain.  Fever.  Nausea and vomiting.  Swelling.  Constipation. How is this diagnosed? A hernia may be diagnosed based on:  Your symptoms and medical history.  A physical exam. Your health care provider may ask you to cough or move in certain ways to see if the hernia becomes visible.  Imaging tests, such as: ? X-rays. ? Ultrasound. ? CT scan. How is this treated? A hernia that is small and painless may not need to be treated. A hernia that is large or painful may be treated with surgery. Inguinal hernias may be treated with surgery to prevent incarceration or strangulation. Strangulated hernias are always treated with surgery because a lack of blood supply to the trapped organ or tissue can cause it to die. Surgery to treat a hernia involves pushing the bulge back into place and repairing the weak area of the muscle or abdominal wall. Follow these instructions at home: Activity  Avoid straining.  Do not lift anything that is heavier than 10 lb (4.5 kg), or the limit that you are told, until your health care provider says that it is safe.  When lifting heavy objects, lift with your leg muscles, not your back muscles. Preventing constipation  Take actions to prevent constipation. Constipation leads to straining with bowel movements, which can make a hernia worse or cause a hernia repair to  break down. Your health care provider may recommend that you: ? Drink enough fluid to keep your urine pale yellow. ? Eat foods that are high in fiber, such as fresh fruits and vegetables, whole grains, and beans. ? Limit foods that are high in fat and processed sugars, such as fried or sweet foods. ? Take an over-the-counter or prescription medicine for constipation. General instructions  When coughing, try to cough gently.  You may try to push the hernia back in place by very gently  pressing on it while lying down. Do not try to force the bulge back in if it will not push in easily.  If you are overweight, work with your health care provider to lose weight safely.  Do not use any products that contain nicotine or tobacco, such as cigarettes and e-cigarettes. If you need help quitting, ask your health care provider.  If you are scheduled for hernia repair, watch your hernia for any changes in shape, size, or color. Tell your health care provider about any changes or new symptoms.  Take over-the-counter and prescription medicines only as told by your health care provider.  Keep all follow-up visits as told by your health care provider. This is important. Contact a health care provider if:  You develop new pain, swelling, or redness around your hernia.  You have signs of constipation, such as: ? Fewer bowel movements in a week than normal. ? Difficulty having a bowel movement. ? Stools that are dry, hard, or larger than normal. Get help right away if:  You have a fever.  You have abdomen pain that gets worse.  You feel nauseous or you vomit.  You cannot push the hernia back in place by very gently pressing on it while lying down. Do not try to force the bulge back in if it will not push in easily.  The hernia: ? Changes in shape, size, or color. ? Feels hard or tender. These symptoms may represent a serious problem that is an emergency. Do not wait to see if the symptoms will go away. Get medical help right away. Call your local emergency services (911 in the [place] Summary  A hernia is the bulging of an organ or tissue through a weak spot in the muscles of the abdomen (abdominal wall).  The main symptom is a skin-colored, rounded lump (bulge) in the hernia area. However, a bulge may not always be present. It may grow bigger or more visible when you cough or strain (such as when having a bowel movement).  A hernia that is small and painless may not need to be  treated. A hernia that is large or painful may be treated with surgery.  Surgery to treat a hernia involves pushing the bulge back into place and repairing the weak part of the abdomen. This information is not intended to replace advice given to you by your health care provider. Make sure you discuss any questions you have with your health care provider. Document Released: 11/21/2005 Document Revised: 03/14/2019 Document Reviewed: 08/23/2017 Elsevier Patient Education  2020 [employer].

## 2019-06-14 NOTE — Progress Notes (Signed)
Subjective: CC: Follow-up CT scan, labs; possible hernia PCP: Paul Norlander, DO IRC:VELFY [name] is a 83 y.o. male presenting to clinic today for:  1.  Hypertension, hyperlipidemia Patient reports compliance with Crestor 10 mg daily Norvasc 2.5 mg daily.  Denies any chest pain, shortness of breath, lower extremity edema.  He does report that he sustained a few tick bites and recently his wife was checking him and noticed a lump on the right side of his groin.  He is wondering if this is something of significance.  He denies any pain, nausea, vomiting, change in stool habits or hematochezia.  He recently had labs and a CAT scan done with his cardiologist.  He was "told that everything looked fine" but would like to talk about these results further.   ROS: Per HPI  Allergies  Allergen Reactions  . Sulfa Antibiotics Itching    Rash   . Vesicare [Solifenacin] Other (See Comments)    HA    Past Medical History:  Diagnosis Date  . Allergic rhinitis   . Allergy   . Aortic atherosclerosis (Paul Parks)   . Bladder disorder    takes bladder control meds  . BPH (benign prostatic hyperplasia)   . Cataract   . DDD (degenerative disc disease)   . Glaucoma   . Hyperlipemia   . Hyperlipidemia   . Labyrinthitis   . Personal history of colonic polyps   . Skull fracture (Paul Parks) 1955   from Paul Parks   . Thrombocytopenia (Paul Parks)   . Vitamin D deficiency     Current Outpatient Medications:  .  amLODipine (NORVASC) 2.5 MG tablet, Take 1 tablet (2.5 mg total) by mouth daily., Disp: 90 tablet, Rfl: 3 .  aspirin 81 MG tablet, Take 81 mg by mouth daily., Disp: , Rfl:  .  cholecalciferol (VITAMIN D) 1000 UNITS tablet, Take 2,000 Units by mouth daily. , Disp: , Rfl:  .  finasteride (PROSCAR) 5 MG tablet, Take 5 mg by mouth 2 (two) times a week., Disp: , Rfl:  .  fluticasone (FLONASE) 50 MCG/ACT nasal spray, Place 1 spray into both nostrils daily., Disp: , Rfl:  .  meclizine (ANTIVERT) 12.5 MG tablet,  Take one (1) tablet (12.5 mg) by mouth four (4) times daily with meals and at bedtime for dizziness., Disp: , Rfl:  .  meloxicam (MOBIC) 15 MG tablet, TAKE 1/2 TO 1 TABLET BY MOUTH EVERY DAY AFTER A MEAL., Disp: 90 tablet, Rfl: 3 .  Multiple Vitamin (MULTIVITAMIN WITH MINERALS) TABS tablet, Take 1 tablet by mouth daily., Disp: , Rfl:  .  rosuvastatin (CRESTOR) 10 MG tablet, Take 1 tablet (10 mg total) by mouth 2 (two) times a week., Disp: 25 tablet, Rfl: 3 .  TRAVATAN Z 0.004 % SOLN ophthalmic solution, Place 1 drop into both eyes at bedtime. , Disp: , Rfl: 11 Social History   Socioeconomic History  . Marital status: Married    Spouse name: Paul Parks  . Number of children: 2  . Years of education: Not on file  . Highest education level: Not on file  Occupational History  . 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 Financial resource strain: Not on file  . Food insecurity    Worry: Not on file    Inability: Not on file  . Transportation needs    Medical: Not on file    Non-medical: Not on file  Tobacco Use  .  Smoking status: Never Smoker  . Smokeless tobacco: Never Used  Substance and Sexual Activity  . Alcohol use: Yes    Alcohol/week: 0.0 standard drinks    Comment: every 2 months 1-2 beers                            . Drug use: No  . Sexual activity: Yes  Lifestyle  . Physical activity    Days per week: Not on file    Minutes per session: Not on file  . Stress: Not on file  Relationships  . Social Herbalist on phone: Not on file    Gets together: Not on file    Attends religious service: Not on file    Active member of club or organization: Not on file    Attends meetings of clubs or organizations: Not on file    Relationship status: Not on file  . Intimate partner violence    Fear of current or ex partner: Not on file    Emotionally abused: Not on file    Physically abused: Not on file    Forced  sexual activity: Not on file  Other Topics Concern  . Not on file  Social History Narrative   Lives at home with wife, Paul Parks    Family History  Problem Relation Age of Onset  . Heart disease Mother   . Diabetes Mother        diet controlled  . Other Mother        tetanus / lock jaw  . Heart disease Brother   . Emphysema Brother        smoker  . Heart disease Father   . Emphysema Father        smoker  . Glaucoma Father   . Cancer Sister        brain and stomach / colon   . Emphysema Sister        smoker  . Lumbar disc disease Sister   . Hyperlipidemia Sister   . Hypertension Sister   . Heart disease Sister   . Polycythemia Son   . Heart disease Sister   . Hyperlipidemia Son   . Colon cancer Neg Hx     Objective: Office vital signs reviewed. BP (!) 142/71   Pulse (!) 58   Temp 97.8 F (36.6 C) (Oral)   Ht 5\' 10"  (1.778 m)   Wt 176 lb (79.8 kg)   BMI 25.25 kg/m   Physical Examination:  General: Awake, alert, well nourished, No acute distress HEENT: Normal, sclera white, MMM Cardio: regular rate and rhythm, S1S2 heard, no murmurs appreciated Pulm: clear to auscultation bilaterally, no wheezes, rhonchi or rales; normal work of breathing on room air GI: soft, non-tender, non-distended, bowel sounds present x4, no hepatomegaly, no splenomegaly, no masses GU: mild fullness noted on right inguinal canal. nontender.  Assessment/ Plan: 83 y.o. male   1. Essential hypertension Controlled for age.  Continue current regimen.  2. Pure hypercholesterolemia Continue statin.  Not due for fasting lipid panel until December  3. Aortic atherosclerosis (Paul Parks) Continue blood pressure control and cholesterol control as above  4. Thrombocytopenia (HCC) Check CBC and hepatic function panel for mildly elevated low platelets - CBC - Hepatic Function Panel  5. Non-recurrent unilateral inguinal hernia without obstruction or gangrene Suspect hernia.  For now we will watchfully  wait but if symptoms progress, low threshold to have patient evaluated  by his urologist versus general surgery for repair.   No orders of the defined types were placed in this encounter.  No orders of the defined types were placed in this encounter.    Paul Norlander, DO [place] [phone]

## 2019-06-15 LAB — CBC
Hematocrit: 41.1 % (ref 37.5–51.0)
Hemoglobin: 13.8 g/dL (ref 13.0–17.7)
MCH: 30.5 pg (ref 26.6–33.0)
MCHC: 33.6 g/dL (ref 31.5–35.7)
MCV: 91 fL (ref 79–97)
Platelets: 149 10*3/uL — ABNORMAL LOW (ref 150–450)
RBC: 4.52 x10E6/uL (ref 4.14–5.80)
RDW: 12.5 % (ref 11.6–15.4)
WBC: 6.3 10*3/uL (ref 3.4–10.8)

## 2019-06-15 LAB — HEPATIC FUNCTION PANEL
ALT: 14 IU/L (ref 0–44)
AST: 19 IU/L (ref 0–40)
Albumin: 4.4 g/dL (ref 3.7–4.7)
Alkaline Phosphatase: 48 IU/L (ref 39–117)
Bilirubin Total: 0.6 mg/dL (ref 0.0–1.2)
Bilirubin, Direct: 0.17 mg/dL (ref 0.00–0.40)
Total Protein: 6.3 g/dL (ref 6.0–8.5)

## 2019-09-17 ENCOUNTER — Other Ambulatory Visit: Payer: Self-pay

## 2019-09-18 ENCOUNTER — Ambulatory Visit (INDEPENDENT_AMBULATORY_CARE_PROVIDER_SITE_OTHER): Payer: Medicare Other | Admitting: Family Medicine

## 2019-09-18 VITALS — BP 131/61

## 2019-09-18 DIAGNOSIS — K635 Polyp of colon: Secondary | ICD-10-CM

## 2019-09-18 DIAGNOSIS — N401 Enlarged prostate with lower urinary tract symptoms: Secondary | ICD-10-CM | POA: Diagnosis not present

## 2019-09-18 DIAGNOSIS — R350 Frequency of micturition: Secondary | ICD-10-CM

## 2019-09-18 DIAGNOSIS — I10 Essential (primary) hypertension: Secondary | ICD-10-CM | POA: Diagnosis not present

## 2019-09-18 DIAGNOSIS — K5790 Diverticulosis of intestine, part unspecified, without perforation or abscess without bleeding: Secondary | ICD-10-CM

## 2019-09-18 DIAGNOSIS — N529 Male erectile dysfunction, unspecified: Secondary | ICD-10-CM | POA: Diagnosis not present

## 2019-09-18 DIAGNOSIS — E7800 Pure hypercholesterolemia, unspecified: Secondary | ICD-10-CM | POA: Diagnosis not present

## 2019-09-18 MED ORDER — TADALAFIL 5 MG PO TABS: 5.0000 mg | ORAL_TABLET | Freq: Every day | ORAL | 5 refills | Status: DC

## 2019-09-18 NOTE — Progress Notes (Signed)
Telephone visit  Subjective: CC: Hypertension with hyperlipidemia, BPH PCP: [name], DO WLK:HVFMB Paul Parks is a 83 y.o. male calls for telephone consult today. Patient provides verbal consent for consult held via phone.  Location of patient: home Location of provider: [facility] Others present for call: wife, [name]  1. Hypertension w/ hyperlipidemia Patient reports compliance with amlodipine 2.5 mg daily.  He however does not take his Crestor.  He notes that he tends to wait and see what his cholesterol levels are before starting the medicines back.  In the meantime, he is maintaining a balanced diet and physical activity as tolerated.  No chest pain, shortness of breath, dizziness or falls.  2.  BPH Patient reports urinary frequency, hesitancy.  He has a urologist, who he sees once per year.  He was prescribed Proscar but notes that it was giving him quite a headache and therefore he discontinued the medicine.  He is interested in starting a daily medicine that may help with the ED as well.  He read online possibly Cialis or Flomax would be helpful.  3. Screen for colon cancer Patient wishes to establish with a new GI provider.  Previously being seen by Dr Carlean Purl.  However, his wife had an adverse outcome and they no longer feel comfortable with him.  He has a history of polyp and diverticulosis.  Does not report melena, hematochezia, abdominal pain.   ROS: Per HPI  Allergies  Allergen Reactions  . Sulfa Antibiotics Itching    Rash   . Vesicare [Solifenacin] Other (See Comments)    HA    Past Medical History:  Diagnosis Date  . Allergic rhinitis   . Allergy   . Aortic atherosclerosis ([place])   . Bladder disorder    takes bladder control meds  . BPH (benign prostatic hyperplasia)   . Cataract   . DDD (degenerative disc disease)   . Glaucoma   . Hyperlipemia   . Hyperlipidemia   . Labyrinthitis   . Personal history of colonic polyps   . Skull fracture ([place]) 1955   from [place]   . Thrombocytopenia ([place])   . Vitamin D deficiency     Current Outpatient Medications:  .  amLODipine (NORVASC) 2.5 MG tablet, Take 1 tablet (2.5 mg total) by mouth daily., Disp: 90 tablet, Rfl: 3 .  aspirin 81 MG tablet, Take 81 mg by mouth daily., Disp: , Rfl:  .  cholecalciferol (VITAMIN D) 1000 UNITS tablet, Take 2,000 Units by mouth daily. , Disp: , Rfl:  .  finasteride (PROSCAR) 5 MG tablet, Take 5 mg by mouth 2 (two) times a week., Disp: , Rfl:  .  fluticasone (FLONASE) 50 MCG/ACT nasal spray, Place 1 spray into both nostrils daily., Disp: , Rfl:  .  meclizine (ANTIVERT) 12.5 MG tablet, Take one (1) tablet (12.5 mg) by mouth four (4) times daily with meals and at bedtime for dizziness., Disp: , Rfl:  .  meloxicam (MOBIC) 15 MG tablet, TAKE 1/2 TO 1 TABLET BY MOUTH EVERY DAY AFTER A MEAL., Disp: 90 tablet, Rfl: 3 .  Multiple Vitamin (MULTIVITAMIN WITH MINERALS) TABS tablet, Take 1 tablet by mouth daily., Disp: , Rfl:  .  rosuvastatin (CRESTOR) 10 MG tablet, Take 1 tablet (10 mg total) by mouth 2 (two) times a week., Disp: 25 tablet, Rfl: 3 .  TRAVATAN Z 0.004 % SOLN ophthalmic solution, Place 1 drop into both eyes at bedtime. , Disp: , Rfl: 11  Assessment/ Plan: 83 y.o. male  1. Essential hypertension Controlled.  Continue current regimen - CMP14+EGFR; Future  2. Pure hypercholesterolemia Not currently on a statin.  Check fasting lipid panel and LFTs tomorrow - Lipid panel; Future - CMP14+EGFR; Future  3. Benign prostatic hyperplasia with urinary frequency He is symptomatic.  We discussed options and he would like to proceed with Cialis.  This is been dispensed.  Follow-up with urology as scheduled - PSA; Future - tadalafil (CIALIS) 5 MG tablet; Take 1 tablet (5 mg total) by mouth daily.  Dispense: 30 tablet; Refill: 5  4. Erectile dysfunction, unspecified erectile dysfunction type We discussed red flag signs and symptoms.  We also discussed the possibility of  fatal hypertension if used with nitros. - tadalafil (CIALIS) 5 MG tablet; Take 1 tablet (5 mg total) by mouth daily.  Dispense: 30 tablet; Refill: 5  5. Diverticulosis He will contact me after he has time to look through a couple of gastroenterologists for a referral.  6. Polyp of colon, unspecified part of colon, unspecified type   Start time: 8:06am End time: 8:26am  Total time spent on patient care (including telephone call/ virtual visit): 25 minutes  [place], [place] [phone]

## 2019-09-19 ENCOUNTER — Ambulatory Visit (INDEPENDENT_AMBULATORY_CARE_PROVIDER_SITE_OTHER): Payer: Medicare Other | Admitting: Family Medicine

## 2019-09-19 ENCOUNTER — Telehealth: Payer: Self-pay | Admitting: Internal Medicine

## 2019-09-19 ENCOUNTER — Other Ambulatory Visit: Payer: Self-pay

## 2019-09-19 VITALS — BP 98/55 | HR 53 | Temp 97.7°F | Ht 70.0 in | Wt 178.0 lb

## 2019-09-19 DIAGNOSIS — E7800 Pure hypercholesterolemia, unspecified: Secondary | ICD-10-CM

## 2019-09-19 DIAGNOSIS — L570 Actinic keratosis: Secondary | ICD-10-CM

## 2019-09-19 DIAGNOSIS — R350 Frequency of micturition: Secondary | ICD-10-CM

## 2019-09-19 DIAGNOSIS — N401 Enlarged prostate with lower urinary tract symptoms: Secondary | ICD-10-CM

## 2019-09-19 DIAGNOSIS — I952 Hypotension due to drugs: Secondary | ICD-10-CM

## 2019-09-19 DIAGNOSIS — I10 Essential (primary) hypertension: Secondary | ICD-10-CM

## 2019-09-19 NOTE — Patient Instructions (Signed)
Cryosurgery for Skin Conditions, Care After This sheet gives you information about how to care for yourself after your procedure. Your health care provider may also give you more specific instructions. If you have problems or questions, contact your health care provider. What can I expect after the procedure? After your procedure, it is common to have redness, swelling, and a blister that forms over the treated area. The blister may contain a small amount of blood. After about 2 weeks, the blister will break on its own, leaving a scab. Then the treated area will heal. After healing, there is usually little or no scarring. Follow these instructions at home: Caring for the treated area   Follow instructions from your health care provider about how to take care of the treated area. Make sure you: ? Keep the area covered with a bandage (dressing) until it heals, or for as long as told by your health care provider. ? Wash your hands with soap and water before you change your dressing. If soap and water are not available, use hand sanitizer. ? Change your dressing as told by your health care provider. ? Keep the dressing and the treated area clean and dry. If the dressing gets wet, change it right away. ? Clean the treated area with soap and water.  Check the treated area every day for signs of infection. Check for: ? More redness, swelling, or pain. ? More fluid or blood. ? Warmth. ? Pus or a bad smell. General instructions  Do not pick at your blister or try to break it open. This can cause infection and scarring.  Do not apply any medicine, cream, or lotion to the treated area unless directed by your health care provider.  Take over-the-counter and prescription medicines only as told by your health care provider.  Keep all follow-up visits as told by your health care provider. This is important. Contact a health care provider if:  You have more redness, swelling, or pain around the  treated area.  You have more fluid or blood coming from the treated area.  The treated area feels warm to the touch.  You have pus or a bad smell coming from the treated area.  Your blister becomes large and painful. Get help right away if:  You have a fever and have redness spreading from the treated area. Summary  The treated area will become red and swollen shortly after the procedure.  You should keep the treated area and your dressing clean and dry.  Check the treated area every day for signs of infection, such as fluid, pus, warmth, or having more redness, swelling, or pain.  Do not pick at your blister or try to break it open. This information is not intended to replace advice given to you by your health care provider. Make sure you discuss any questions you have with your health care provider. Document Released: 06/10/2005 Document Revised: 11/03/2017 Document Reviewed: 10/10/2016 Elsevier Patient Education  [place]. Actinic Keratosis An actinic keratosis is a precancerous growth on the skin. If there is more than one growth, the condition is called actinic keratoses. Actinic keratoses appear most often on areas of skin that get a lot of sun exposure, including the scalp, face, ears, lips, upper back, forearms, and the backs of the hands. If left untreated, these growths may develop into a skin cancer called squamous cell carcinoma. It is important to have all these growths checked by a health care provider to determine the best  treatment approach. What are the causes? Actinic keratoses are caused by getting too much ultraviolet (UV) radiation from the sun or other UV light sources. What increases the risk? You are more likely to develop this condition if you:  Have light-colored skin and blue eyes.  Have blond or red hair.  Spend a lot of time in the sun.  Do not protect your skin from the sun when outdoors.  Are an older person. The risk of developing an  actinic keratosis increases with age. What are the signs or symptoms? Actinic keratoses feel like scaly, rough spots of skin. Symptoms of this condition include growths that may:  Be as small as a pinhead or as big as a quarter.  Itch, hurt, or feel sensitive.  Be skin-colored, light tan, dark tan, pink, or a combination of any of these colors. In most cases, the growths become red.  Have a small piece of pink or gray skin (skin tag) growing from them. It may be easier to notice actinic keratoses by feeling them, rather than seeing them. Sometimes, actinic keratoses disappear, but many reappear a few days to a few weeks later. How is this diagnosed? This condition is usually diagnosed with a physical exam.  A tissue sample may be removed from the actinic keratosis and examined under a microscope (biopsy). How is this treated? If needed, this condition may be treated by:  Scraping off the actinic keratosis (curettage).  Freezing the actinic keratosis with liquid nitrogen (cryosurgery). This causes the growth to eventually fall off the skin.  Applying medicated creams or gels to destroy the cells in the growth.  Applying chemicals to the actinic keratosis to make the outer layers of skin peel off (chemical peel).  Using photodynamic therapy. In this procedure, medicated cream is applied to the actinic keratosis. This cream increases your skin's sensitivity to light. Then, a strong light is aimed at the actinic keratosis to destroy cells in the growth. Follow these instructions at home: Skin care  Apply cool, wet cloths (cool compresses) to the affected areas.  Do not scratch your skin.  Check your skin regularly for any growths, especially growths that: ? Start to itch or bleed. ? Change in size, shape, or color. Caring for the treated area  Keep the treated area clean and dry as told by your health care provider.  Do not apply any medicine, cream, or lotion to the treated  area unless your health care provider tells you to do that.  Do not pick at blisters or try to break them open. This can cause infection and scarring.  If you have red or irritated skin after treatment, follow instructions from your health care provider about how to take care of the treated area. Make sure you: ? Wash your hands with soap and water before you change your bandage (dressing). If soap and water are not available, use hand sanitizer. ? Change your dressing as told by your health care provider.  If you have red or irritated skin after treatment, check your treated area every day for signs of infection. Check for: ? Redness, swelling, or pain. ? Fluid or blood. ? Warmth. ? Pus or a bad smell. General instructions  Take or apply over-the-counter and prescription medicines only as told by your health care provider.  Return to your normal activities as told by your health care provider. Ask your health care provider what activities are safe for you.  Have a skin exam done every year by  a health care provider who is a skin specialist (dermatologist).  Keep all follow-up visits as told by your health care provider. This is important. Lifestyle  Do not use any products that contain nicotine or tobacco, such as cigarettes and e-cigarettes. If you need help quitting, ask your health care provider.  Take steps to protect your skin from the sun. ? Try to avoid the sun between 10:00 a.m. and 4:00 p.m. This is when the UV light is the strongest. ? Use a sunscreen or sunblock with SPF 30 (sun protection factor 30) or greater. ? Apply sunscreen before you are exposed to sunlight and reapply as often as directed by the instructions on the sunscreen container. ? Always wear sunglasses that have UV protection, and always wear a hat and clothing to protect your skin from sunlight. ? When possible, avoid medicines that increase your sensitivity to sunlight. ? Do not use tanning beds or other  indoor tanning devices. Contact a health care provider if:  You notice any changes or new growths on your skin.  You have swelling, pain, or more redness around your treated area.  You have fluid or blood coming from your treated area.  Your treated area feels warm to the touch.  You have pus or a bad smell coming from your treated area.  You have a fever.  You have a blister that becomes large and painful. Summary  An actinic keratosis is a precancerous growth on the skin. If there is more than one growth, the condition is called actinic keratoses. In some cases, if left untreated, these growths can develop into skin cancer.  Check your skin regularly for any growths, especially growths that start to itch or bleed, or change in size, shape, or color.  Take steps to protect your skin from the sun.  Contact a health care provider if you notice any changes or new growths on your skin.  Keep all follow-up visits as told by your health care provider. This is important. This information is not intended to replace advice given to you by your health care provider. Make sure you discuss any questions you have with your health care provider. Document Released: 02/17/2009 Document Revised: 04/03/2018 Document Reviewed: 04/03/2018 Elsevier Patient Education  2020 [employer].

## 2019-09-19 NOTE — Addendum Note (Signed)
Addended by: [name] on: 09/19/2019 11:59 AM   Modules accepted: Orders

## 2019-09-19 NOTE — Addendum Note (Signed)
Addended by: Pollyann Kennedy F on: 09/19/2019 03:50 PM   Modules accepted: Orders
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
## 2019-09-19 NOTE — Telephone Encounter (Signed)
yes

## 2019-09-19 NOTE — Telephone Encounter (Signed)
Dr. Carlean Purl, note from PCP states that pt requested to transfer GI care to Dr. Hilarie Fredrickson.  Will you approve the switch?   There is a referral for pt to be evaluated for diverticulosis and hx of colon polyps.  Recall colon  placed 02/2019.

## 2019-09-20 LAB — CMP14+EGFR
ALT: 11 IU/L (ref 0–44)
AST: 18 IU/L (ref 0–40)
Albumin/Globulin Ratio: 2.2 (ref 1.2–2.2)
Albumin: 4.2 g/dL (ref 3.6–4.6)
Alkaline Phosphatase: 48 IU/L (ref 39–117)
BUN/Creatinine Ratio: 15 (ref 10–24)
BUN: 17 mg/dL (ref 8–27)
Bilirubin Total: 0.5 mg/dL (ref 0.0–1.2)
CO2: 26 mmol/L (ref 20–29)
Calcium: 9.1 mg/dL (ref 8.6–10.2)
Chloride: 102 mmol/L (ref 96–106)
Creatinine, Ser: 1.17 mg/dL (ref 0.76–1.27)
GFR calc Af Amer: 67 mL/min/{1.73_m2} (ref >59)
GFR calc non Af Amer: 58 mL/min/{1.73_m2} — ABNORMAL LOW (ref >59)
Globulin, Total: 1.9 g/dL (ref 1.5–4.5)
Glucose: 100 mg/dL — ABNORMAL HIGH (ref 65–99)
Potassium: 4.7 mmol/L (ref 3.5–5.2)
Sodium: 139 mmol/L (ref 134–144)
Total Protein: 6.1 g/dL (ref 6.0–8.5)

## 2019-09-20 LAB — LIPID PANEL
Chol/HDL Ratio: 2.7 ratio (ref 0.0–5.0)
Cholesterol, Total: 183 mg/dL (ref 100–199)
HDL: 68 mg/dL (ref >39)
LDL Chol Calc (NIH): 103 mg/dL — ABNORMAL HIGH (ref 0–99)
Triglycerides: 65 mg/dL (ref 0–149)
VLDL Cholesterol Cal: 12 mg/dL (ref 5–40)

## 2019-09-20 LAB — PSA: Prostate Specific Ag, Serum: 2.6 ng/mL (ref 0.0–4.0)

## 2019-09-20 NOTE — Telephone Encounter (Signed)
ok 

## 2019-10-28 ENCOUNTER — Encounter: Payer: Self-pay | Admitting: *Deleted

## 2019-10-29 ENCOUNTER — Encounter: Payer: Self-pay | Admitting: Internal Medicine

## 2019-10-29 ENCOUNTER — Ambulatory Visit: Payer: Medicare Other | Admitting: Internal Medicine

## 2019-10-29 VITALS — BP 120/60 | HR 54 | Temp 98.1°F | Ht 70.5 in | Wt 178.0 lb

## 2019-10-29 DIAGNOSIS — Z86010 Personal history of colonic polyps: Secondary | ICD-10-CM

## 2019-10-29 DIAGNOSIS — R1032 Left lower quadrant pain: Secondary | ICD-10-CM | POA: Diagnosis not present

## 2019-10-29 NOTE — Patient Instructions (Signed)
You have been scheduled for a CT scan of the abdomen and pelvis at [facility] (1st Floor of hospital).   You are scheduled on Friday 11/08/19 at 8:30 am. You should arrive 15 minutes prior to your appointment time for registration. Please follow the written instructions below on the day of your exam:  WARNING: IF YOU ARE ALLERGIC TO IODINE/X-RAY DYE, PLEASE NOTIFY RADIOLOGY IMMEDIATELY AT [phone]! YOU WILL BE GIVEN A 13 HOUR PREMEDICATION PREP.  1) Do not eat or drink anything after 4:30 am (4 hours prior to your test) 2) You have been given 2 bottles of oral contrast to drink. The solution may taste better if refrigerated, but do NOT add ice or any other liquid to this solution. Shake well before drinking.    Drink 1 bottle of contrast @ 6:30 am (2 hours prior to your exam)  Drink 1 bottle of contrast @ 7:30 am (1 hour prior to your exam)  You may take any medications as prescribed with a small amount of water, if necessary. If you take any of the following medications: METFORMIN, GLUCOPHAGE, GLUCOVANCE, AVANDAMET, RIOMET, FORTAMET, [place] MET, JANUMET, GLUMETZA or METAGLIP, you MAY be asked to HOLD this medication 48 hours AFTER the exam.  The purpose of you drinking the oral contrast is to aid in the visualization of your intestinal tract. The contrast solution may cause some diarrhea. Depending on your individual set of symptoms, you may also receive an intravenous injection of x-ray contrast/dye. Plan on being at radiology for 30 minutes or longer, depending on the type of exam you are having performed.  This test typically takes 30-45 minutes to complete.  If you have any questions regarding your exam or if you need to reschedule, you may call the CT department at [phone] between the hours of 8:00 am and 5:00 pm, Monday-Friday.  ______________________________________________________________ Paul Parks have been scheduled for a colonoscopy. Please follow written instructions  given to you at your visit today.  Please pick up your prep supplies at the pharmacy within the next 1-3 days. If you use inhalers (even only as needed), please bring them with you on the day of your procedure. Your physician has requested that you go to www.startemmi.com and enter the access code given to you at your visit today. This web site gives a general overview about your procedure. However, you should still follow specific instructions given to you by our office regarding your preparation for the procedure. ____________________________________________________________  If you are age 46 or older, your body mass index should be between 23-30. Your Body mass index is 25.18 kg/m. If this is out of the aforementioned range listed, please consider follow up with your Primary Care Provider.  If you are age 19 or younger, your body mass index should be between 19-25. Your Body mass index is 25.18 kg/m. If this is out of the aformentioned range listed, please consider follow up with your Primary Care Provider.
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
## 2019-11-06 ENCOUNTER — Telehealth: Payer: Self-pay | Admitting: Internal Medicine

## 2019-11-06 ENCOUNTER — Other Ambulatory Visit: Payer: Self-pay

## 2019-11-06 DIAGNOSIS — R1032 Left lower quadrant pain: Secondary | ICD-10-CM

## 2019-11-06 MED ORDER — NA SULFATE-K SULFATE-MG SULF 17.5-3.13-1.6 GM/177ML PO SOLN: 1.0000 | Freq: Once | ORAL | 0 refills | Status: AC

## 2019-11-06 NOTE — Telephone Encounter (Signed)
Spoke with pt and he is aware. Order in epic for lab and he will come tomorrow.

## 2019-11-06 NOTE — Telephone Encounter (Signed)
Suprep sent to pharmacy 

## 2019-11-07 ENCOUNTER — Other Ambulatory Visit (INDEPENDENT_AMBULATORY_CARE_PROVIDER_SITE_OTHER): Payer: Medicare Other

## 2019-11-07 DIAGNOSIS — R1032 Left lower quadrant pain: Secondary | ICD-10-CM

## 2019-11-07 LAB — BASIC METABOLIC PANEL
BUN: 21 mg/dL (ref 6–23)
CO2: 29 mEq/L (ref 19–32)
Calcium: 9.4 mg/dL (ref 8.4–10.5)
Chloride: 102 mEq/L (ref 96–112)
Creatinine, Ser: 1.1 mg/dL (ref 0.40–1.50)
GFR: 64.2 mL/min (ref >60.00)
Glucose, Bld: 96 mg/dL (ref 70–99)
Potassium: 4.8 mEq/L (ref 3.5–5.1)
Sodium: 139 mEq/L (ref 135–145)

## 2019-11-08 ENCOUNTER — Ambulatory Visit (HOSPITAL_COMMUNITY)
Admission: RE | Admit: 2019-11-08 | Discharge: 2019-11-08 | Disposition: A | Discharge status: Home / Self Care | Payer: Medicare Other | Source: Ambulatory Visit | Attending: Internal Medicine | Admitting: Internal Medicine

## 2019-11-08 ENCOUNTER — Other Ambulatory Visit: Payer: Self-pay | Admitting: Internal Medicine

## 2019-11-08 ENCOUNTER — Ambulatory Visit (INDEPENDENT_AMBULATORY_CARE_PROVIDER_SITE_OTHER): Payer: Medicare Other

## 2019-11-08 ENCOUNTER — Other Ambulatory Visit: Payer: Self-pay

## 2019-11-08 DIAGNOSIS — Z1159 Encounter for screening for other viral diseases: Secondary | ICD-10-CM

## 2019-11-08 DIAGNOSIS — R1032 Left lower quadrant pain: Secondary | ICD-10-CM | POA: Diagnosis present

## 2019-11-08 LAB — SARS CORONAVIRUS 2 (TAT 6-24 HRS): SARS Coronavirus 2: NEGATIVE

## 2019-11-08 MED ORDER — SODIUM CHLORIDE (PF) 0.9 % IJ SOLN
INTRAMUSCULAR | Status: AC
  Filled 2019-11-08: qty 50

## 2019-11-08 MED ORDER — IOHEXOL 300 MG/ML  SOLN
100.0000 mL | Freq: Once | INTRAMUSCULAR | Status: AC | PRN
  Administered 2019-11-08: 100 mL via INTRAVENOUS

## 2019-11-12 ENCOUNTER — Encounter: Payer: Self-pay | Admitting: Internal Medicine

## 2019-11-12 ENCOUNTER — Other Ambulatory Visit: Payer: Self-pay

## 2019-11-12 ENCOUNTER — Ambulatory Visit (AMBULATORY_SURGERY_CENTER): Payer: Medicare Other | Admitting: Internal Medicine

## 2019-11-12 VITALS — BP 169/66 | HR 51 | Temp 98.4°F | Resp 13 | Ht 70.5 in | Wt 178.0 lb

## 2019-11-12 DIAGNOSIS — Z86010 Personal history of colonic polyps: Secondary | ICD-10-CM | POA: Diagnosis not present

## 2019-11-12 DIAGNOSIS — D122 Benign neoplasm of ascending colon: Secondary | ICD-10-CM

## 2019-11-12 DIAGNOSIS — D123 Benign neoplasm of transverse colon: Secondary | ICD-10-CM

## 2019-11-12 MED ORDER — SODIUM CHLORIDE 0.9 % IV SOLN: 500.0000 mL | Freq: Once | INTRAVENOUS | Status: DC

## 2019-11-12 NOTE — Progress Notes (Signed)
Pt tolerated well. VSS. SB with occ.perfused aberrant beats. To recovery.

## 2019-11-12 NOTE — Op Note (Signed)
[name] Procedure Date: 11/12/2019 9:23 AM MRN: [id number] Endoscopist: Jerene Bears , MD Age: 83 Referring MD:  Date of Birth: 06/04/1936 Gender: Male Account #: [id number] Procedure:                Colonoscopy Indications:              Surveillance: Personal history of adenomatous                            polyps on last colonoscopy 5 years ago Medicines:                Monitored Anesthesia Care Procedure:                Pre-Anesthesia Assessment:                           - Prior to the procedure, a History and Physical                            was performed, and patient medications and                            allergies were reviewed. The patient's tolerance of                            previous anesthesia was also reviewed. The risks                            and benefits of the procedure and the sedation                            options and risks were discussed with the patient.                            All questions were answered, and informed consent                            was obtained. Prior Anticoagulants: The patient has                            taken no previous anticoagulant or antiplatelet                            agents. ASA Grade Assessment: II - A patient with                            mild systemic disease. After reviewing the risks                            and benefits, the patient was deemed in                            satisfactory condition to undergo the procedure.  After obtaining informed consent, the colonoscope                            was passed under direct vision. Throughout the                            procedure, the patient's blood pressure, pulse, and                            oxygen saturations were monitored continuously. The                            Colonoscope was introduced through the anus and                            advanced to the cecum,  identified by appendiceal                            orifice and ileocecal valve. The colonoscopy was                            performed without difficulty. The patient tolerated                            the procedure well. The quality of the bowel                            preparation was good. The ileocecal valve,                            appendiceal orifice, and rectum were photographed. Scope In: 9:35:47 AM Scope Out: 9:54:06 AM Scope Withdrawal Time: 0 hours 15 minutes 12 seconds  Total Procedure Duration: 0 hours 18 minutes 19 seconds  Findings:                 The digital rectal exam was normal.                           Three sessile polyps were found in the ascending                            colon. The polyps were 3 to 6 mm in size. These                            polyps were removed with a cold snare. Resection                            and retrieval were complete.                           Three sessile polyps were found in the transverse                            colon. The polyps were 2 to  8 mm in size. These                            polyps were removed with a cold snare. Resection                            and retrieval were complete.                           Multiple medium-mouthed diverticula were found in                            the proximal sigmoid colon and descending colon to                            the splenic flexure.                           Internal hemorrhoids were found during retroflexion. Complications:            No immediate complications. Estimated Blood Loss:     Estimated blood loss was minimal. Impression:               - Three 3 to 6 mm polyps in the ascending colon,                            removed with a cold snare. Resected and retrieved.                           - Three 2 to 8 mm polyps in the transverse colon,                            removed with a cold snare. Resected and retrieved.                           -  Diverticulosis in the sigmoid colon and in the                            descending colon.                           - Internal hemorrhoids. Recommendation:           - Patient has a contact number available for                            emergencies. The signs and symptoms of potential                            delayed complications were discussed with the                            patient. Return to normal activities tomorrow.  Written discharge instructions were provided to the                            patient.                           - Resume previous diet.                           - Continue present medications.                           - Await pathology results.                           - No recommendation at this time regarding repeat                            colonoscopy due to age at next screening interval.                            Decision to repeat would be based on pathology                            results and his overall health in 3-5 years.                           - No findings today or by CT from a GI standpoint                            to explain intermittent LLQ pain. There is                            significant lumbar disc and vertebral                            disease/arthritis and this may be causing referred                            pain to the LLQ. If this continues to bother you I                            recommend to discuss further with your primary care                            provider. Jerene Bears, MD 11/12/2019 10:00:14 AM This report has been signed electronically.

## 2019-11-12 NOTE — Progress Notes (Signed)
Called to room to assist during endoscopic procedure.  Patient ID and intended procedure confirmed with present staff. Received instructions for my participation in the procedure from the performing physician.  

## 2019-11-12 NOTE — Progress Notes (Signed)
VS-CW Temp-LC

## 2019-11-12 NOTE — Patient Instructions (Signed)
YOU HAD AN ENDOSCOPIC PROCEDURE TODAY AT THE [facility]:   Refer to the procedure report that was given to you for any specific questions about what was found during the examination.  If the procedure report does not answer your questions, please call your gastroenterologist to clarify.  If you requested that your care partner not be given the details of your procedure findings, then the procedure report has been included in a sealed envelope for you to review at your convenience later.  YOU SHOULD EXPECT: Some feelings of bloating in the abdomen. Passage of more gas than usual.  Walking can help get rid of the air that was put into your GI tract during the procedure and reduce the bloating. If you had a lower endoscopy (such as a colonoscopy or flexible sigmoidoscopy) you may notice spotting of blood in your stool or on the toilet paper. If you underwent a bowel prep for your procedure, you may not have a normal bowel movement for a few days.  Please Note:  You might notice some irritation and congestion in your nose or some drainage.  This is from the oxygen used during your procedure.  There is no need for concern and it should clear up in a day or so.  SYMPTOMS TO REPORT IMMEDIATELY:   Following lower endoscopy (colonoscopy or flexible sigmoidoscopy):  Excessive amounts of blood in the stool  Significant tenderness or worsening of abdominal pains  Swelling of the abdomen that is new, acute  Fever of 100F or higher   For urgent or emergent issues, a gastroenterologist can be reached at any hour by calling [phone].   DIET:  We do recommend a small meal at first, but then you may proceed to your regular diet.  Drink plenty of fluids but you should avoid alcoholic beverages for 24 hours.  MEDICATIONS: Continue present medications.  Please see handouts given to you by your recovery nurse.  ACTIVITY:  You should plan to take it easy for the rest of today and you should  NOT DRIVE or use heavy machinery until tomorrow (because of the sedation medicines used during the test).    FOLLOW UP: Our staff will call the number listed on your records 48-72 hours following your procedure to check on you and address any questions or concerns that you may have regarding the information given to you following your procedure. If we do not reach you, we will leave a message.  We will attempt to reach you two times.  During this call, we will ask if you have developed any symptoms of COVID 19. If you develop any symptoms (ie: fever, flu-like symptoms, shortness of breath, cough etc.) before then, please call [phone].  If you test positive for Covid 19 in the 2 weeks post procedure, please call and report this information to us.    If any biopsies were taken you will be contacted by phone or by letter within the next 1-3 weeks.  Please call us at [phone] if you have not heard about the biopsies in 3 weeks.   Thank you for allowing us to provide for your healthcare needs today.   SIGNATURES/CONFIDENTIALITY: You and/or your care partner have signed paperwork which will be entered into your electronic medical record.  These signatures attest to the fact that that the information above on your After Visit Summary has been reviewed and is understood.  Full responsibility of the confidentiality of this discharge information lies with you and/or   your care-partner. 

## 2019-11-14 ENCOUNTER — Telehealth: Payer: Self-pay

## 2019-11-14 ENCOUNTER — Encounter: Payer: Self-pay | Admitting: Internal Medicine

## 2019-11-14 NOTE — Telephone Encounter (Signed)
  Follow up Call-  Call back number 11/12/2019  Post procedure Call Back phone  # [phone]  Permission to leave phone message Yes  Some recent data might be hidden     Patient questions:  Do you have a fever, pain , or abdominal swelling? No. Pain Score  0 *  Have you tolerated food without any problems? Yes.    Have you been able to return to your normal activities? Yes.    Do you have any questions about your discharge instructions: Diet   No. Medications  No. Follow up visit  No.  Do you have questions or concerns about your Care? No.  Actions: * If pain score is 4 or above: No action needed, pain <4.  1. Have you developed a fever since your procedure? no  2.   Have you had an respiratory symptoms (SOB or cough) since your procedure? no  3.   Have you tested positive for COVID 19 since your procedure no  4.   Have you had any family members/close contacts diagnosed with the COVID 19 since your procedure?  no   If yes to any of these questions please route to Joylene John, RN and Alphonsa Gin, Therapist, sports.

## 2019-11-21 ENCOUNTER — Other Ambulatory Visit: Payer: Self-pay | Admitting: Family Medicine

## 2019-12-10 ENCOUNTER — Ambulatory Visit: Payer: Medicare Other | Admitting: Nurse Practitioner

## 2019-12-10 ENCOUNTER — Ambulatory Visit (INDEPENDENT_AMBULATORY_CARE_PROVIDER_SITE_OTHER): Payer: Medicare PPO

## 2019-12-10 ENCOUNTER — Encounter: Payer: Self-pay | Admitting: Nurse Practitioner

## 2019-12-10 ENCOUNTER — Other Ambulatory Visit: Payer: Self-pay

## 2019-12-10 VITALS — BP 120/58 | HR 58 | Temp 99.0°F | Resp 20 | Ht 70.5 in | Wt 185.0 lb

## 2019-12-10 DIAGNOSIS — S8011XA Contusion of right lower leg, initial encounter: Secondary | ICD-10-CM

## 2019-12-10 NOTE — Progress Notes (Signed)
   Subjective:    Patient ID: Paul Parks, male    DOB: Sep 01, 1936, 84 y.o.   MRN: [id number]   Chief Complaint: right lower leg injury (a log hit it >1 week ago )   HPI Patient comes saying that he had a log roll up agaist his right shin last Monday. Caused large contusion and swelling. The contusion has gotten better and some of swelling has gone down. Still painful to walk on.   Review of Systems  Constitutional: Negative for diaphoresis.  Eyes: Negative for pain.  Respiratory: Negative for shortness of breath.   Cardiovascular: Negative for chest pain, palpitations and leg swelling.  Gastrointestinal: Negative for abdominal pain.  Endocrine: Negative for polydipsia.  Musculoskeletal:       Right lower  leg sorenes  Skin: Negative for rash.  Neurological: Negative for dizziness, weakness and headaches.  Hematological: Does not bruise/bleed easily.  All other systems reviewed and are negative.      Objective:   Physical Exam Musculoskeletal:     Comments: Purplish contusion along medial border of right foot.  Tender nodule noted on medial mid shin area- slightly tender  To touch Palpable pedal pulse.   right tibia- no fracture noted-Preliminary reading by Ronnald Collum, FNP  [facility]  BP (!) 120/58   Pulse (!) 58   Temp 99 F (37.2 C)   Resp 20   Ht 5' 10.5" (1.791 m)   Wt 185 lb (83.9 kg)   SpO2 100%   BMI 26.17 kg/m       Assessment & Plan:  Reason Blan in today with chief complaint of right lower leg injury (a log hit it >1 week ago )   1. Contusion of multiple sites of right lower extremity, initial encounter Ice bid Elevate when sitting TO prn - DG Tibia/Fibula Right; Future   Mary-Margaret Hassell Done, FNP

## 2019-12-10 NOTE — Patient Instructions (Signed)
Contusion A contusion is a deep bruise. This is a result of an injury that causes bleeding under the skin. Symptoms of bruising include pain, swelling, and discolored skin. The skin may turn blue, purple, or yellow. Follow these instructions at home: Managing pain, stiffness, and swelling You may use RICE. This stands for:  Resting.  Icing.  Compression, or putting pressure.  Elevating, or raising the injured area. To follow this method, do these actions:  Rest the injured area.  If told, put ice on the injured area. ? Put ice in a plastic bag. ? Place a towel between your skin and the bag. ? Leave the ice on for 20 minutes, 2-3 times per day.  If told, put light pressure (compression) on the injured area using an elastic bandage. Make sure the bandage is not too tight. If the area tingles or becomes numb, remove it and put it back on as told by your doctor.  If possible, raise (elevate) the injured area above the level of your heart while you are sitting or lying down.  General instructions  Take over-the-counter and prescription medicines only as told by your doctor.  Keep all follow-up visits as told by your doctor. This is important. Contact a doctor if:  Your symptoms do not get better after several days of treatment.  Your symptoms get worse.  You have trouble moving the injured area. Get help right away if:  You have very bad pain.  You have a loss of feeling (numbness) in a hand or foot.  Your hand or foot turns pale or cold. Summary  A contusion is a deep bruise. This is a result of an injury that causes bleeding under the skin.  Symptoms of bruising include pain, swelling, and discolored skin. The skin may turn blue, purple, or yellow.  This condition is treated with rest, ice, compression, and elevation. This is also called RICE. You may be given over-the-counter medicines for pain.  Contact a doctor if you do not feel better, or you feel worse. Get  help right away if you have very bad pain, have lost feeling in a hand or foot, or the area turns pale or cold. This information is not intended to replace advice given to you by your health care provider. Make sure you discuss any questions you have with your health care provider. Document Revised: 07/13/2018 Document Reviewed: 07/13/2018 Elsevier Patient Education  2020 Elsevier Inc.  

## 2020-01-02 ENCOUNTER — Other Ambulatory Visit: Payer: Self-pay

## 2020-01-02 ENCOUNTER — Ambulatory Visit: Payer: Medicare Other | Admitting: Internal Medicine

## 2020-01-02 ENCOUNTER — Encounter: Payer: Self-pay | Admitting: Internal Medicine

## 2020-01-02 DIAGNOSIS — I493 Ventricular premature depolarization: Secondary | ICD-10-CM

## 2020-01-02 NOTE — Progress Notes (Signed)
HPI [name] returns today for followup of sinus node dysfunction, PVC's and HTN. He has done well in the interim with no chest pain or sob. He is active. His only complaint is that he gets a little sob when he splits logs. He notes a sore on his left leg after a log fell on his calf. He has not had any other complaints. No chest pain. His leg has been slow to heal and the pain resolved just a week ago. Allergies  Allergen Reactions  . Sulfa Antibiotics Itching    Rash   . Vesicare [Solifenacin] Other (See Comments)    HA      Current Outpatient Medications  Medication Sig Dispense Refill  . amLODipine (NORVASC) 2.5 MG tablet Take 1 tablet (2.5 mg total) by mouth daily. 90 tablet 3  . aspirin 81 MG tablet Take 81 mg by mouth daily.    . cholecalciferol (VITAMIN D) 1000 UNITS tablet Take 2,000 Units by mouth daily.     . fluticasone (FLONASE) 50 MCG/ACT nasal spray Place 1 spray into both nostrils daily.    . meclizine (ANTIVERT) 12.5 MG tablet Take one (1) tablet (12.5 mg) by mouth four (4) times daily with meals and at bedtime for dizziness.    . meloxicam (MOBIC) 15 MG tablet TAKE 1/2 TO 1 TABLET BY MOUTH EVERY DAY AFTER A MEAL. 90 tablet 3  . Multiple Vitamin (MULTIVITAMIN WITH MINERALS) TABS tablet Take 1 tablet by mouth daily.    . rosuvastatin (CRESTOR) 10 MG tablet Take 1 tablet (10 mg total) by mouth 2 (two) times a week. Needs to be seen for further refills. 12 tablet 0  . TRAVATAN Z 0.004 % SOLN ophthalmic solution Place 1 drop into both eyes at bedtime.   11   Current Facility-Administered Medications  Medication Dose Route Frequency Provider Last Rate Last Admin  . 0.9 %  sodium chloride infusion  500 mL Intravenous Once Pyrtle, [name], MD         Past Medical History:  Diagnosis Date  . Allergic rhinitis   . Allergy   . Aortic atherosclerosis ([place])   . Bladder disorder    takes bladder control meds  . BPH (benign prostatic hyperplasia)   . Cataract   .  DDD (degenerative disc disease)   . Diverticulosis   . Glaucoma   . Hyperlipemia   . Hyperlipidemia   . Labyrinthitis   . Personal history of colonic polyps   . Skull fracture ([place]) 1955   from [place]   . Thrombocytopenia ([place])   . Tubular adenoma of colon   . Vitamin D deficiency     ROS:   All systems reviewed and negative except as noted in the HPI.   Past Surgical History:  Procedure Laterality Date  . COLON SURGERY     polyps   . COLONOSCOPY    . EYE SURGERY Bilateral    cataracts   . left knee surgery- microscopic  Left 80's   [facility]   . 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 TREATMENT FISTULA ANAL       Family History  Problem Relation Age of Onset  . Heart disease Mother   . Diabetes Mother        diet controlled  . Other Mother        tetanus / lock jaw  . Heart disease Brother   .  Emphysema Brother        smoker  . Heart disease Father   . Emphysema Father        smoker  . Glaucoma Father   . Cancer Sister        brain and stomach / colon   . Emphysema Sister        smoker  . Lumbar disc disease Sister   . Hyperlipidemia Sister   . Hypertension Sister   . Heart disease Sister   . Polycythemia Son   . Heart disease Sister   . Hyperlipidemia Son   . Colon cancer Neg Hx   . Esophageal cancer Neg Hx   . Rectal cancer Neg Hx   . Stomach cancer Neg Hx      Social History   Socioeconomic History  . Marital status: Married    Spouse name: Paul Parks  . Number of children: 2  . Years of education: Not on file  . Highest education level: Not on file  Occupational History  . 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 Smoking status: Never Smoker  . Smokeless tobacco: Never Used  Substance and Sexual Activity  . Alcohol use: Yes    Alcohol/week: 0.0 standard drinks    Comment: every 2 months 1-2 beers                            . Drug use: No  . Sexual  activity: Yes  Other Topics Concern  . Not on file  Social History Narrative   Lives at home with wife, Paul Parks    Social Determinants of Health   Financial Resource Strain:   . Difficulty of Paying Living Expenses: Not on file  Food Insecurity:   . Worried About Charity fundraiser in the Last Year: Not on file  . Ran Out of Food in the Last Year: Not on file  Transportation Needs:   . Lack of Transportation (Medical): Not on file  . Lack of Transportation (Non-Medical): Not on file  Physical Activity:   . Days of Exercise per Week: Not on file  . Minutes of Exercise per Session: Not on file  Stress:   . Feeling of Stress : Not on file  Social Connections:   . Frequency of Communication with Friends and Family: Not on file  . Frequency of Social Gatherings with Friends and Family: Not on file  . Attends Religious Services: Not on file  . Active Member of Clubs or Organizations: Not on file  . Attends Archivist Meetings: Not on file  . Marital Status: Not on file  Intimate Partner Violence:   . Fear of Current or Ex-Partner: Not on file  . Emotionally Abused: Not on file  . Physically Abused: Not on file  . Sexually Abused: Not on file     Ht 5' 10.5" (1.791 m)   BMI 26.17 kg/m   Physical Exam:  Well appearing NAD HEENT: Unremarkable Neck:  No JVD, no thyromegally Lymphatics:  No adenopathy Back:  No CVA tenderness Lungs:  Clear with no wheezes HEART:  Regular rate rhythm, no murmurs, no rubs, no clicks Abd:  soft, positive bowel sounds, no organomegally, no rebound, no guarding Ext:  2 plus pulses, no edema, no cyanosis, no clubbing Skin:  No rashes no nodules Neuro:  CN II through XII intact, motor grossly  normal.  ECG - NSR with PAC';s   Assess/Plan: 1. Sinus node dysfunction - he is asymptomatic and if anything his HR has improved.  2. HTN - his bp is well controlled today. He will undergo watchful waiting.  3. Dyslipidemia - he will continue  his statin therapy.   Paul Parks.D

## 2020-01-02 NOTE — Patient Instructions (Addendum)
Medication Instructions:  Your physician recommends that you continue on your current medications as directed. Please refer to the Current Medication list given to you today.  Labwork: None ordered.  Testing/Procedures: None ordered.  Follow-Up: Your physician wants you to follow-up in: as needed with Dr. Taylor.      Any Other Special Instructions Will Be Listed Below (If Applicable).  If you need a refill on your cardiac medications before your next appointment, please call your pharmacy.   

## 2020-01-14 ENCOUNTER — Other Ambulatory Visit: Payer: Self-pay

## 2020-01-15 ENCOUNTER — Encounter: Payer: Self-pay | Admitting: Family Medicine

## 2020-01-15 ENCOUNTER — Ambulatory Visit: Payer: Medicare PPO | Admitting: Family Medicine

## 2020-01-15 VITALS — BP 119/67 | HR 65 | Temp 98.2°F | Wt 183.0 lb

## 2020-01-15 DIAGNOSIS — L570 Actinic keratosis: Secondary | ICD-10-CM | POA: Diagnosis not present

## 2020-01-15 DIAGNOSIS — S61210A Laceration without foreign body of right index finger without damage to nail, initial encounter: Secondary | ICD-10-CM

## 2020-01-15 DIAGNOSIS — D696 Thrombocytopenia, unspecified: Secondary | ICD-10-CM

## 2020-01-15 DIAGNOSIS — S80921A Unspecified superficial injury of right lower leg, initial encounter: Secondary | ICD-10-CM

## 2020-01-15 DIAGNOSIS — Z23 Encounter for immunization: Secondary | ICD-10-CM | POA: Diagnosis not present

## 2020-01-15 DIAGNOSIS — T148XXA Other injury of unspecified body region, initial encounter: Secondary | ICD-10-CM

## 2020-01-15 LAB — CBC
Hematocrit: 40.3 % (ref 37.5–51.0)
Hemoglobin: 14 g/dL (ref 13.0–17.7)
MCH: 31.4 pg (ref 26.6–33.0)
MCHC: 34.7 g/dL (ref 31.5–35.7)
MCV: 90 fL (ref 79–97)
Platelets: 143 10*3/uL — ABNORMAL LOW (ref 150–450)
RBC: 4.46 x10E6/uL (ref 4.14–5.80)
RDW: 12.5 % (ref 11.6–15.4)
WBC: 6.2 10*3/uL (ref 3.4–10.8)

## 2020-01-15 NOTE — Addendum Note (Signed)
Addended byCarrolyn Leigh on: 01/15/2020 11:56 AM   Modules accepted: Orders

## 2020-01-15 NOTE — [facility] Notes (Signed)
Subjective: CC: Follow-up actinic keratosis, finger laceration PCP: Paul Norlander, DO [id number] Paul Parks is a 84 y.o. male presenting to clinic today for:  1.  Actinic keratosis The patient was treated with cryotherapy for actinic keratosis at his last visit 4 months ago.  He notes total resolution of these lesions and no recurrence.  2.  Finger laceration/ hematoma Patient notes he sustained a finger laceration a few days ago.  He was applying putty with a putty knife and damaged the palmar surface of the right index finger.  He notes some ongoing oozing of blood but no purulence, increased warmth, erythema.  He has been keeping the area clean with peroxide, a topical antibiotic and a Band-Aid.  Last tetanus was in 2012.  Patient notes that he sustained an injury to the right anterior shin in December.  He had a very large hematoma that resulted in he was evaluated for this early January.  He notes that the lesion has gone down quite a bit but he still has a slightly hard area on the anterior shin.  He wonders if this is normal.  3.  Thrombocytopenia He reports some oozing as above.  ROS: Per HPI  Allergies  Allergen Reactions  . Sulfa Antibiotics Itching    Rash   . Vesicare [Solifenacin] Other (See Comments)    HA    Past Medical History:  Diagnosis Date  . Allergic rhinitis   . Allergy   . Aortic atherosclerosis ([place])   . Bladder disorder    takes bladder control meds  . BPH (benign prostatic hyperplasia)   . Cataract   . DDD (degenerative disc disease)   . Diverticulosis   . Glaucoma   . Hyperlipemia   . Hyperlipidemia   . Labyrinthitis   . Personal history of colonic polyps   . Skull fracture ([place]) 1955   from [place]   . Thrombocytopenia ([place])   . Tubular adenoma of colon   . Vitamin D deficiency     Current Outpatient Medications:  .  amLODipine (NORVASC) 2.5 MG tablet, Take 1 tablet (2.5 mg total) by mouth daily., Disp: 90 tablet, Rfl: 3 .  aspirin 81  MG tablet, Take 81 mg by mouth daily., Disp: , Rfl:  .  cholecalciferol (VITAMIN D) 1000 UNITS tablet, Take 2,000 Units by mouth daily. , Disp: , Rfl:  .  fluticasone (FLONASE) 50 MCG/ACT nasal spray, Place 1 spray into both nostrils daily., Disp: , Rfl:  .  meclizine (ANTIVERT) 12.5 MG tablet, Take one (1) tablet (12.5 mg) by mouth four (4) times daily with meals and at bedtime for dizziness., Disp: , Rfl:  .  meloxicam (MOBIC) 15 MG tablet, TAKE 1/2 TO 1 TABLET BY MOUTH EVERY DAY AFTER A MEAL., Disp: 90 tablet, Rfl: 3 .  Multiple Vitamin (MULTIVITAMIN WITH MINERALS) TABS tablet, Take 1 tablet by mouth daily., Disp: , Rfl:  .  rosuvastatin (CRESTOR) 10 MG tablet, Take 1 tablet (10 mg total) by mouth 2 (two) times a week. Needs to be seen for further refills., Disp: 12 tablet, Rfl: 0 .  TRAVATAN Z 0.004 % SOLN ophthalmic solution, Place 1 drop into both eyes at bedtime. , Disp: , Rfl: 11  Current Facility-Administered Medications:  .  0.9 %  sodium chloride infusion, 500 mL, Intravenous, Once, Pyrtle, [name], MD Social History   Socioeconomic History  . Marital status: Married    Spouse name: Paul Parks  . Number of children: 2  . Years of  education: Not on file  . Highest education level: Not on file  Occupational History  . 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 Smoking status: Never Smoker  . Smokeless tobacco: Never Used  Substance and Sexual Activity  . Alcohol use: Yes    Alcohol/week: 0.0 standard drinks    Comment: every 2 months 1-2 beers                            . Drug use: No  . Sexual activity: Yes  Other Topics Concern  . Not on file  Social History Narrative   Lives at home with wife, Paul Parks    Social Determinants of Health   Financial Resource Strain:   . Difficulty of Paying Living Expenses: Not on file  Food Insecurity:   . Worried About Charity fundraiser in the Last Year: Not on file  . Ran Out of  Food in the Last Year: Not on file  Transportation Needs:   . Lack of Transportation (Medical): Not on file  . Lack of Transportation (Non-Medical): Not on file  Physical Activity:   . Days of Exercise per Week: Not on file  . Minutes of Exercise per Session: Not on file  Stress:   . Feeling of Stress : Not on file  Social Connections:   . Frequency of Communication with Friends and Family: Not on file  . Frequency of Social Gatherings with Friends and Family: Not on file  . Attends Religious Services: Not on file  . Active Member of Clubs or Organizations: Not on file  . Attends Archivist Meetings: Not on file  . Marital Status: Not on file  Intimate Partner Violence:   . Fear of Current or Ex-Partner: Not on file  . Emotionally Abused: Not on file  . Physically Abused: Not on file  . Sexually Abused: Not on file   Family History  Problem Relation Age of Onset  . Heart disease Mother   . Diabetes Mother        diet controlled  . Other Mother        tetanus / lock jaw  . Heart disease Brother   . Emphysema Brother        smoker  . Heart disease Father   . Emphysema Father        smoker  . Glaucoma Father   . Cancer Sister        brain and stomach / colon   . Emphysema Sister        smoker  . Lumbar disc disease Sister   . Hyperlipidemia Sister   . Hypertension Sister   . Heart disease Sister   . Polycythemia Son   . Heart disease Sister   . Hyperlipidemia Son   . Colon cancer Neg Hx   . Esophageal cancer Neg Hx   . Rectal cancer Neg Hx   . Stomach cancer Neg Hx     Objective: Office vital signs reviewed. BP 119/67   Pulse 65   Temp 98.2 F (36.8 C)   Wt 183 lb (83 kg)   BMI 25.89 kg/m   Physical Examination:  General: Awake, alert, well nourished, No acute distress HEENT: Normal, sclera white, MMM Cardio: regular rate and rhythm, S1S2 heard, no murmurs appreciated Pulm: clear to auscultation bilaterally, no wheezes, rhonchi or rales;  normal  work of breathing on room air Extremities: warm, well perfused, No edema, cyanosis or clubbing; +2 pulses bilaterally Skin: Actinic keratosis lesions resolved.  Superficial laceration noted to the right index finger on the palmar side.  He has a several irregular areas of skin breakdown/excoriation.  There is slight oozing noted on the Band-Aid of blood but no purulence, increased warmth, erythema to suggest secondary skin infection. Right anterior shin with a tennis ball size area of calcification.  No induration, fluctuance, warmth or skin breakdown.  Assessment/ Plan: 84 y.o. male   1. Actinic keratoses Resolved.  No further treatment needed at this time  2. Laceration of right index finger without foreign body without damage to nail, initial encounter Evaluated.  This appears to be superficial laceration.  I suspect the ongoing oozing is secondary to thrombocytopenia.  We will check CBC as below.  We discussed wound care, dressing and evaluation for secondary infection.  Understands reasons for return.  He will follow-up as needed on this issue.  Tetanus shot was administered today.  3. Thrombocytopenia (HCC) - CBC  4. Hematoma I suspect that the area of hardness that he is experiencing on the anterior shin is secondary calcification after hematoma.  We discussed that this could in fact be present ongoing but that it should certainly continue to shrink in size.   No orders of the defined types were placed in this encounter.  No orders of the defined types were placed in this encounter.    Paul Norlander, DO [place] [phone]

## 2020-01-15 NOTE — Patient Instructions (Signed)

## 2020-01-17 ENCOUNTER — Other Ambulatory Visit: Payer: Self-pay | Admitting: Family Medicine

## 2020-01-17 DIAGNOSIS — D696 Thrombocytopenia, unspecified: Secondary | ICD-10-CM

## 2020-01-24 ENCOUNTER — Other Ambulatory Visit: Payer: Self-pay | Admitting: Internal Medicine

## 2020-01-24 MED ORDER — AMLODIPINE BESYLATE 2.5 MG PO TABS: 2.5000 mg | ORAL_TABLET | Freq: Every day | ORAL | 3 refills | Status: DC

## 2020-02-10 ENCOUNTER — Other Ambulatory Visit: Payer: Self-pay | Admitting: Family Medicine

## 2020-03-27 ENCOUNTER — Other Ambulatory Visit: Payer: Self-pay | Admitting: Family Medicine

## 2020-03-27 NOTE — Telephone Encounter (Signed)
OV 05/13/20

## 2020-04-24 DIAGNOSIS — Z79899 Other long term (current) drug therapy: Secondary | ICD-10-CM | POA: Diagnosis not present

## 2020-04-24 DIAGNOSIS — H401123 Primary open-angle glaucoma, left eye, severe stage: Secondary | ICD-10-CM | POA: Diagnosis not present

## 2020-04-24 DIAGNOSIS — H401112 Primary open-angle glaucoma, right eye, moderate stage: Secondary | ICD-10-CM | POA: Diagnosis not present

## 2020-05-13 ENCOUNTER — Ambulatory Visit: Payer: Medicare PPO | Admitting: Family Medicine

## 2020-05-13 ENCOUNTER — Other Ambulatory Visit: Payer: Self-pay

## 2020-05-13 ENCOUNTER — Encounter: Payer: Self-pay | Admitting: Family Medicine

## 2020-05-13 VITALS — BP 126/60 | HR 52 | Temp 97.2°F | Ht 70.0 in | Wt 182.0 lb

## 2020-05-13 DIAGNOSIS — I700 Atherosclerosis of aorta: Secondary | ICD-10-CM

## 2020-05-13 DIAGNOSIS — I10 Essential (primary) hypertension: Secondary | ICD-10-CM

## 2020-05-13 DIAGNOSIS — D696 Thrombocytopenia, unspecified: Secondary | ICD-10-CM | POA: Diagnosis not present

## 2020-05-13 DIAGNOSIS — N529 Male erectile dysfunction, unspecified: Secondary | ICD-10-CM

## 2020-05-13 MED ORDER — TADALAFIL 10 MG PO TABS: 10.0000 mg | ORAL_TABLET | Freq: Every day | ORAL | 12 refills | Status: DC | PRN

## 2020-05-13 NOTE — Patient Instructions (Signed)

## 2020-05-13 NOTE — [facility] Notes (Signed)
Subjective: CC: Follow-up HTN, HLD PCP: Paul Norlander, DO [facility]:Paul Parks is a 84 y.o. male presenting to clinic today for:  1. HTN/ HLD Patient reports compliance with his Norvasc 2.5 mg daily, Crestor 10 mg daily No chest pain, shortness of breath.  No lower extremity edema.  He is tolerating exercise fairly well.  He does intermittently have some lower abdominal pulling with certain activities.  No bulging.  Imaging has not revealed any evidence of hernia or abnormalities.  2.  Erectile dysfunction Patient reports that the 5 mg of Cialis does not seem to be helping.  He would like to increase to 10 mg if possible.   ROS: Per HPI  Allergies  Allergen Reactions  . Sulfa Antibiotics Itching    Rash   . Vesicare [Solifenacin] Other (See Comments)    HA    Past Medical History:  Diagnosis Date  . Allergic rhinitis   . Allergy   . Aortic atherosclerosis ([place])   . Bladder disorder    takes bladder control meds  . BPH (benign prostatic hyperplasia)   . Cataract   . DDD (degenerative disc disease)   . Diverticulosis   . Glaucoma   . Hyperlipemia   . Hyperlipidemia   . Labyrinthitis   . Personal history of colonic polyps   . Skull fracture ([place]) 1955   from [place]   . Thrombocytopenia ([place])   . Tubular adenoma of colon   . Vitamin D deficiency     Current Outpatient Medications:  .  alfuzosin (UROXATRAL) 10 MG 24 hr tablet, , Disp: , Rfl:  .  amLODipine (NORVASC) 2.5 MG tablet, Take 1 tablet (2.5 mg total) by mouth daily., Disp: 90 tablet, Rfl: 3 .  aspirin 81 MG tablet, Take 81 mg by mouth daily., Disp: , Rfl:  .  cholecalciferol (VITAMIN D) 1000 UNITS tablet, Take 2,000 Units by mouth daily. , Disp: , Rfl:  .  fluticasone (FLONASE) 50 MCG/ACT nasal spray, Place 1 spray into both nostrils daily., Disp: , Rfl:  .  meclizine (ANTIVERT) 12.5 MG tablet, Take one (1) tablet (12.5 mg) by mouth four (4) times daily with meals and at bedtime for dizziness., Disp: , Rfl:   .  meloxicam (MOBIC) 15 MG tablet, TAKE 1/2 TO 1 TABLET BY MOUTH EVERY DAY AFTER A MEAL., Disp: 90 tablet, Rfl: 3 .  Multiple Vitamin (MULTIVITAMIN WITH MINERALS) TABS tablet, Take 1 tablet by mouth daily., Disp: , Rfl:  .  rosuvastatin (CRESTOR) 10 MG tablet, Take 1 tablet (10 mg total) by mouth 2 (two) times a week., Disp: 12 tablet, Rfl: 0 .  TRAVATAN Z 0.004 % SOLN ophthalmic solution, Place 1 drop into both eyes at bedtime. , Disp: , Rfl: 11  Current Facility-Administered Medications:  .  0.9 %  sodium chloride infusion, 500 mL, Intravenous, Once, Pyrtle, [name], MD Social History   Socioeconomic History  . Marital status: Married    Spouse name: Paul Parks  . Number of children: 2  . Years of education: Not on file  . Highest education level: Not on file  Occupational History  . 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 Smoking status: Never Smoker  . Smokeless tobacco: Never Used  Substance and Sexual Activity  . Alcohol use: Yes    Alcohol/week: 0.0 standard drinks    Comment: every 2 months 1-2 beers                            .  Drug use: No  . Sexual activity: Yes  Other Topics Concern  . Not on file  Social History Narrative   Lives at home with wife, Paul Parks    Social Determinants of Health   Financial Resource Strain:   . Difficulty of Paying Living Expenses:   Food Insecurity:   . Worried About Charity fundraiser in the Last Year:   . Arboriculturist in the Last Year:   Transportation Needs:   . Film/video editor (Medical):   Marland Kitchen Lack of Transportation (Non-Medical):   Physical Activity:   . Days of Exercise per Week:   . Minutes of Exercise per Session:   Stress:   . Feeling of Stress :   Social Connections:   . Frequency of Communication with Friends and Family:   . Frequency of Social Gatherings with Friends and Family:   . Attends Religious Services:   . Active Member of Clubs or  Organizations:   . Attends Archivist Meetings:   Marland Kitchen Marital Status:   Intimate Partner Violence:   . Fear of Current or Ex-Partner:   . Emotionally Abused:   Marland Kitchen Physically Abused:   . Sexually Abused:    Family History  Problem Relation Age of Onset  . Heart disease Mother   . Diabetes Mother        diet controlled  . Other Mother        tetanus / lock jaw  . Heart disease Brother   . Emphysema Brother        smoker  . Heart disease Father   . Emphysema Father        smoker  . Glaucoma Father   . Cancer Sister        brain and stomach / colon   . Emphysema Sister        smoker  . Lumbar disc disease Sister   . Hyperlipidemia Sister   . Hypertension Sister   . Heart disease Sister   . Polycythemia Son   . Heart disease Sister   . Hyperlipidemia Son   . Colon cancer Neg Hx   . Esophageal cancer Neg Hx   . Rectal cancer Neg Hx   . Stomach cancer Neg Hx     Objective: Office vital signs reviewed. BP 126/60   Pulse (!) 52   Temp (!) 97.2 F (36.2 C) (Temporal)   Ht _0  (1.778 m)   Wt 182 lb (82.6 kg)   SpO2 97%   BMI 26.11 kg/m   Physical Examination:  General: Awake, alert, well nourished, No acute distress HEENT: Normal, sclera white, MMM Cardio: regular rate and rhythm, S1S2 heard, no murmurs appreciated Pulm: clear to auscultation bilaterally, no wheezes, rhonchi or rales; normal work of breathing on room air Extremities: warm, well perfused, No edema, cyanosis or clubbing; +2 pulses bilaterally  Assessment/ Plan: 84 y.o. male   1. Essential hypertension Well-controlled.  Continue current regimen - CMP14+EGFR  2. Aortic atherosclerosis (HCC) - CMP14+EGFR - Lipid Panel  3. Thrombocytopenia (HCC) - CBC with Differential/Platelet  4. Erectile dysfunction, unspecified erectile dysfunction type Cialis increased to 10 mg daily    No orders of the defined types were placed in this encounter.  Meds ordered this encounter  Medications   . tadalafil (CIALIS) 10 MG tablet    Sig: Take 1 tablet (10 mg total) by mouth daily as needed for erectile dysfunction.    Dispense:  10 tablet  Refill:  [place], [place] [phone]

## 2020-05-14 LAB — LIPID PANEL
Chol/HDL Ratio: 2.2 ratio (ref 0.0–5.0)
Cholesterol, Total: 155 mg/dL (ref 100–199)
HDL: 69 mg/dL (ref >39)
LDL Chol Calc (NIH): 75 mg/dL (ref 0–99)
Triglycerides: 55 mg/dL (ref 0–149)
VLDL Cholesterol Cal: 11 mg/dL (ref 5–40)

## 2020-05-14 LAB — CMP14+EGFR
ALT: 13 IU/L (ref 0–44)
AST: 22 IU/L (ref 0–40)
Albumin/Globulin Ratio: 2.3 — ABNORMAL HIGH (ref 1.2–2.2)
Albumin: 4.3 g/dL (ref 3.6–4.6)
Alkaline Phosphatase: 53 IU/L (ref 48–121)
BUN/Creatinine Ratio: 14 (ref 10–24)
BUN: 17 mg/dL (ref 8–27)
Bilirubin Total: 0.8 mg/dL (ref 0.0–1.2)
CO2: 24 mmol/L (ref 20–29)
Calcium: 9.2 mg/dL (ref 8.6–10.2)
Chloride: 100 mmol/L (ref 96–106)
Creatinine, Ser: 1.19 mg/dL (ref 0.76–1.27)
GFR calc Af Amer: 66 mL/min/{1.73_m2} (ref >59)
GFR calc non Af Amer: 57 mL/min/{1.73_m2} — ABNORMAL LOW (ref >59)
Globulin, Total: 1.9 g/dL (ref 1.5–4.5)
Glucose: 89 mg/dL (ref 65–99)
Potassium: 4.5 mmol/L (ref 3.5–5.2)
Sodium: 138 mmol/L (ref 134–144)
Total Protein: 6.2 g/dL (ref 6.0–8.5)

## 2020-05-14 LAB — CBC WITH DIFFERENTIAL/PLATELET
Basophils Absolute: 0 10*3/uL (ref 0.0–0.2)
Basos: 0 %
EOS (ABSOLUTE): 0.1 10*3/uL (ref 0.0–0.4)
Eos: 3 %
Hematocrit: 38.3 % (ref 37.5–51.0)
Hemoglobin: 13.3 g/dL (ref 13.0–17.7)
Immature Grans (Abs): 0 10*3/uL (ref 0.0–0.1)
Immature Granulocytes: 0 %
Lymphocytes Absolute: 1.6 10*3/uL (ref 0.7–3.1)
Lymphs: 32 %
MCH: 30.5 pg (ref 26.6–33.0)
MCHC: 34.7 g/dL (ref 31.5–35.7)
MCV: 88 fL (ref 79–97)
Monocytes Absolute: 0.4 10*3/uL (ref 0.1–0.9)
Monocytes: 8 %
Neutrophils Absolute: 2.8 10*3/uL (ref 1.4–7.0)
Neutrophils: 57 %
Platelets: 128 10*3/uL — ABNORMAL LOW (ref 150–450)
RBC: 4.36 x10E6/uL (ref 4.14–5.80)
RDW: 12.8 % (ref 11.6–15.4)
WBC: 5.1 10*3/uL (ref 3.4–10.8)

## 2020-05-20 ENCOUNTER — Telehealth: Payer: Self-pay | Admitting: Family Medicine

## 2020-05-20 NOTE — Telephone Encounter (Signed)
Lab results reviewed

## 2020-05-28 ENCOUNTER — Telehealth: Payer: Self-pay | Admitting: Family Medicine

## 2020-05-28 NOTE — Telephone Encounter (Signed)
Covid Shot Information  Where: [facility] When: 01/01/2020 # [id number] and 2nd 01/29/2020# [id number] Type:Moderna

## 2020-06-17 ENCOUNTER — Other Ambulatory Visit: Payer: Self-pay | Admitting: Family Medicine

## 2020-07-09 ENCOUNTER — Other Ambulatory Visit: Payer: Self-pay | Admitting: Family Medicine

## 2020-07-20 ENCOUNTER — Telehealth: Payer: Self-pay | Admitting: Family Medicine

## 2020-07-20 NOTE — Telephone Encounter (Signed)
Wife aware and verbalizes understanding per dpr. Appointment scheduled.

## 2020-07-20 NOTE — Telephone Encounter (Signed)
Paul Parks called stating that he has back pain with numbness down his leg and needs to be seen. I spoke with Paul Parks in triage and she scheduled him with Paul Parks on Tuesday, August 17th. He really wants to see Paul Parks who is his PCP. He wants to know if there is any way that Paul Parks can work him in for appt.

## 2020-07-20 NOTE — Telephone Encounter (Signed)
Double book my 930am (held for a potential newborn).  Just let him know he might have to wait a bit to be seen if the newborn ends up coming in.

## 2020-07-21 ENCOUNTER — Ambulatory Visit: Payer: Medicare PPO | Admitting: Family Medicine

## 2020-07-21 ENCOUNTER — Other Ambulatory Visit: Payer: Self-pay

## 2020-07-21 ENCOUNTER — Encounter: Payer: Self-pay | Admitting: Family Medicine

## 2020-07-21 VITALS — BP 105/53 | HR 66 | Temp 98.2°F | Ht 70.0 in | Wt 180.6 lb

## 2020-07-21 DIAGNOSIS — M5416 Radiculopathy, lumbar region: Secondary | ICD-10-CM

## 2020-07-21 DIAGNOSIS — M5136 Other intervertebral disc degeneration, lumbar region: Secondary | ICD-10-CM | POA: Diagnosis not present

## 2020-07-21 DIAGNOSIS — M47816 Spondylosis without myelopathy or radiculopathy, lumbar region: Secondary | ICD-10-CM

## 2020-07-21 MED ORDER — METHYLPREDNISOLONE ACETATE 40 MG/ML IJ SUSP
40.0000 mg | Freq: Once | INTRAMUSCULAR | Status: AC
  Administered 2020-07-21: 40 mg via INTRAMUSCULAR

## 2020-07-21 MED ORDER — IBUPROFEN 600 MG PO TABS: 600.0000 mg | ORAL_TABLET | Freq: Three times a day (TID) | ORAL | 1 refills | Status: DC | PRN

## 2020-07-21 MED ORDER — METHYLPREDNISOLONE ACETATE 40 MG/ML IJ SUSP: 40.0000 mg | Freq: Once | INTRAMUSCULAR | Status: DC

## 2020-07-21 MED ORDER — PREDNISONE 10 MG (21) PO TBPK: ORAL_TABLET | ORAL | 0 refills | Status: DC

## 2020-07-21 NOTE — Patient Instructions (Signed)
STOP meloxicam.  Use Motrin as needed as directed.  Ok to use WITH a tylenol (provides better relief than either alone).  Start PREDNISONE TOMORROW  Referral to Physical therapy next door placed.  You will get a call (hopefully today!)

## 2020-07-21 NOTE — Progress Notes (Signed)
Subjective: CC: Low back pain with right leg numbness PCP: [name], DO Paul Parks is a 84 y.o. male presenting to clinic today for:  1.  Low back pain with right leg numbness Patient reports onset about 1 week ago.  He points to the lateral aspect of the right leg starting at the hip, down to the ankle as the area of numb sensation.  He has been taking meloxicam 7.5 mg twice daily and daytime symptoms seem to be under fairly good control but nighttime symptoms keep him up.  He sometimes will get up and take an ibuprofen which does allow him to go back to bed and rest.  He has tried one of his wife's muscle relaxers but this was not helpful.  No saddle anesthesia, fecal incontinence or urinary retention.  He is ambulating independently.  No preceding injury.  He performs core exercises every morning and has been trying to use these to help with the pain as well.  ROS: Per HPI  Allergies  Allergen Reactions  . Sulfa Antibiotics Itching    Rash   . Vesicare [Solifenacin] Other (See Comments)    HA    Past Medical History:  Diagnosis Date  . Allergic rhinitis   . Allergy   . Aortic atherosclerosis ([place])   . Bladder disorder    takes bladder control meds  . BPH (benign prostatic hyperplasia)   . Cataract   . DDD (degenerative disc disease)   . Diverticulosis   . Glaucoma   . Hyperlipemia   . Hyperlipidemia   . Labyrinthitis   . Personal history of colonic polyps   . Skull fracture ([place]) 1955   from [place]   . Thrombocytopenia ([place])   . Tubular adenoma of colon   . Vitamin D deficiency     Current Outpatient Medications:  .  alfuzosin (UROXATRAL) 10 MG 24 hr tablet, , Disp: , Rfl:  .  amLODipine (NORVASC) 2.5 MG tablet, Take 1 tablet (2.5 mg total) by mouth daily., Disp: 90 tablet, Rfl: 3 .  aspirin 81 MG tablet, Take 81 mg by mouth daily., Disp: , Rfl:  .  cholecalciferol (VITAMIN D) 1000 UNITS tablet, Take 2,000 Units by mouth daily. , Disp: , Rfl:  .   fluticasone (FLONASE) 50 MCG/ACT nasal spray, Place 1 spray into both nostrils daily., Disp: , Rfl:  .  meclizine (ANTIVERT) 12.5 MG tablet, Take one (1) tablet (12.5 mg) by mouth four (4) times daily with meals and at bedtime for dizziness., Disp: , Rfl:  .  meloxicam (MOBIC) 15 MG tablet, TAKE 1/2 TO 1 TABLET BY MOUTH EVERY DAY AFTER A MEAL., Disp: 90 tablet, Rfl: 3 .  Multiple Vitamin (MULTIVITAMIN WITH MINERALS) TABS tablet, Take 1 tablet by mouth daily., Disp: , Rfl:  .  rosuvastatin (CRESTOR) 10 MG tablet, TAKE 1 TABLET BY MOUTH 2 TIMES A WEEK, Disp: 12 tablet, Rfl: 1 .  tadalafil (CIALIS) 10 MG tablet, Take 1 tablet (10 mg total) by mouth daily as needed for erectile dysfunction., Disp: 10 tablet, Rfl: 12 .  TRAVATAN Z 0.004 % SOLN ophthalmic solution, Place 1 drop into both eyes at bedtime. , Disp: , Rfl: 11  Current Facility-Administered Medications:  .  0.9 %  sodium chloride infusion, 500 mL, Intravenous, Once, Pyrtle, [name], MD Social History   Socioeconomic History  . Marital status: Married    Spouse name: [name]  . Number of children: 2  . Years of education: Not on  file  . Highest education level: Not on file  Occupational History  . 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 Smoking status: Never Smoker  . Smokeless tobacco: Never Used  Vaping Use  . Vaping Use: Never used  Substance and Sexual Activity  . Alcohol use: Yes    Alcohol/week: 0.0 standard drinks    Comment: every 2 months 1-2 beers                            . Drug use: No  . Sexual activity: Yes  Other Topics Concern  . Not on file  Social History Narrative   Lives at home with wife, [name]    Social Determinants of Health   Financial Resource Strain:   . Difficulty of Paying Living Expenses:   Food Insecurity:   . Worried About Charity fundraiser in the Last Year:   . Arboriculturist in the Last Year:   Transportation Needs:   . Consulting civil engineer (Medical):   Marland Kitchen Lack of Transportation (Non-Medical):   Physical Activity:   . Days of Exercise per Week:   . Minutes of Exercise per Session:   Stress:   . Feeling of Stress :   Social Connections:   . Frequency of Communication with Friends and Family:   . Frequency of Social Gatherings with Friends and Family:   . Attends Religious Services:   . Active Member of Clubs or Organizations:   . Attends Archivist Meetings:   Marland Kitchen Marital Status:   Intimate Partner Violence:   . Fear of Current or Ex-Partner:   . Emotionally Abused:   Marland Kitchen Physically Abused:   . Sexually Abused:    Family History  Problem Relation Age of Onset  . Heart disease Mother   . Diabetes Mother        diet controlled  . Other Mother        tetanus / lock jaw  . Heart disease Brother   . Emphysema Brother        smoker  . Heart disease Father   . Emphysema Father        smoker  . Glaucoma Father   . Cancer Sister        brain and stomach / colon   . Emphysema Sister        smoker  . Lumbar disc disease Sister   . Hyperlipidemia Sister   . Hypertension Sister   . Heart disease Sister   . Polycythemia Son   . Heart disease Sister   . Hyperlipidemia Son   . Colon cancer Neg Hx   . Esophageal cancer Neg Hx   . Rectal cancer Neg Hx   . Stomach cancer Neg Hx     Objective: Office vital signs reviewed. BP (!) 105/53   Pulse 66   Temp 98.2 F (36.8 C)   Ht 5\' 10"  (1.778 m)   Wt 180 lb 9.6 oz (81.9 kg)   SpO2 94%   BMI 25.91 kg/m   Physical Examination:  General: Awake, alert, well nourished, No acute distress MSK: antalgic gait and station  Lumbar spine: Has full painless active range of motion in all planes.  No midline tenderness palpation.  No paraspinal muscle tenderness to palpation.  Negative straight leg raise bilaterally. Skin: dry; intact; no rashes or lesions  Neuro: 5/5 LE Strength and light touch sensation grossly intact EXCEPT for along the medial calf  on the right (decreased compared to left)   EXAM: CT ABDOMEN AND PELVIS WITH CONTRAST  TECHNIQUE: Multidetector CT imaging of the abdomen and pelvis was performed using the standard protocol following bolus administration of intravenous contrast.  CONTRAST:  176mL OMNIPAQUE IOHEXOL 300 MG/ML  SOLN  COMPARISON:  Report from CT abdomen from 11/03/2010  FINDINGS: Lower chest: Right coronary artery atherosclerotic calcification.  Hepatobiliary: 1.0 by 0.6 by 0.7 cm hypodense lesion in segment 4b of the liver on image 27/2 adjacent to the gallbladder, likely a small cyst or similar benign lesion but technically too small to characterize.  The gallbladder appears normal. No biliary dilatation.  Pancreas: Unremarkable  Spleen: Unremarkable  Adrenals/Urinary Tract: Unremarkable  Stomach/Bowel: Unremarkable  Vascular/Lymphatic: Aortoiliac atherosclerotic vascular disease.  Reproductive: Moderate prostatomegaly.  Other: No supplemental non-categorized findings.  Musculoskeletal: Lumbar spondylosis and degenerative disc disease causing suspected impingement at L2-3, L3-4, and L4-5.  Mild fatty prominence of the right spermatic cord potentially from a very small indirect inguinal hernia. No well-defined hernia on the left.  IMPRESSION: 1. A cause for the patient's left lower quadrant abdominal pain is not identified. 2. Other imaging findings of potential clinical significance: Coronary atherosclerosis. Moderate prostatomegaly. Lumbar spondylosis and degenerative disc disease causing suspected impingement at L2-3, L3-4, and L4-5. Potential very small right indirect inguinal hernia containing adipose tissue.  Aortic Atherosclerosis (ICD10-I70.0).   Electronically Signed   By: Van Clines M.D.   On: 11/08/2019 10:20  Assessment/ Plan: 83 y.o. male   1. Lumbar spondylosis No red flags.  Having some radicular symptoms.  I reviewed his last  imaging which showed possible impingement on the CT scan in December 2020.  Referral to physical therapy placed.  He was given a Depo-Medrol shot.  He will try Advil plus Tylenol.  If this is not controlling his symptoms sufficiently, he will proceed with prednisone Dosepak.  Has not responded to muscle relaxers in the past but this was not given.  If no significant improvement with oral medications with PT, need to consider referral to orthopedics - methylPREDNISolone acetate (DEPO-MEDROL) injection 40 mg - Ambulatory referral to Physical Therapy - ibuprofen (ADVIL) 600 MG tablet; Take 1 tablet (600 mg total) by mouth every 8 (eight) hours as needed for moderate pain.  Dispense: 30 tablet; Refill: 1 - predniSONE (STERAPRED UNI-PAK 21 TAB) 10 MG (21) TBPK tablet; As directed x 6 days. Start 8/18.  Dispense: 21 tablet; Refill: 0  2. DDD (degenerative disc disease), lumbar - methylPREDNISolone acetate (DEPO-MEDROL) injection 40 mg - Ambulatory referral to Physical Therapy - ibuprofen (ADVIL) 600 MG tablet; Take 1 tablet (600 mg total) by mouth every 8 (eight) hours as needed for moderate pain.  Dispense: 30 tablet; Refill: 1 - predniSONE (STERAPRED UNI-PAK 21 TAB) 10 MG (21) TBPK tablet; As directed x 6 days. Start 8/18.  Dispense: 21 tablet; Refill: 0  3. Lumbar radiculopathy, right - methylPREDNISolone acetate (DEPO-MEDROL) injection 40 mg - Ambulatory referral to Physical Therapy - ibuprofen (ADVIL) 600 MG tablet; Take 1 tablet (600 mg total) by mouth every 8 (eight) hours as needed for moderate pain.  Dispense: 30 tablet; Refill: 1 - predniSONE (STERAPRED UNI-PAK 21 TAB) 10 MG (21) TBPK tablet; As directed x 6 days. Start 8/18.  Dispense: 21 tablet; Refill: 0   No orders of the defined types were placed in this encounter.  No orders of the defined  types were placed in this encounter.    [name], DO [place] [phone]

## 2020-07-24 ENCOUNTER — Other Ambulatory Visit: Payer: Self-pay

## 2020-07-24 ENCOUNTER — Encounter: Payer: Self-pay | Admitting: Physical Therapy

## 2020-07-24 ENCOUNTER — Ambulatory Visit: Payer: Medicare PPO | Attending: Family Medicine | Admitting: Physical Therapy

## 2020-07-24 DIAGNOSIS — M545 Low back pain, unspecified: Secondary | ICD-10-CM

## 2020-07-24 NOTE — Therapy (Signed)
[facility] [place], [place], [address] Phone: [phone]   Fax:  [phone]  Physical Therapy Evaluation  Patient Details  Name: Paul Parks MRN: [id number] Date of Birth: [birth year] Referring Provider (PT): Ronnie Doss DO   Encounter Date: 07/24/2020   PT End of Session - 07/24/20 1401    Visit Number 1    Number of Visits 12    Date for PT Re-Evaluation 09/04/20    Authorization Type FOTO.    PT Start Time 0900    PT Stop Time 0949    PT Time Calculation (min) 49 min    Activity Tolerance Patient tolerated treatment well           Past Medical History:  Diagnosis Date  . Allergic rhinitis   . Allergy   . Aortic atherosclerosis ([place])   . Bladder disorder    takes bladder control meds  . BPH (benign prostatic hyperplasia)   . Cataract   . DDD (degenerative disc disease)   . Diverticulosis   . Glaucoma   . Hyperlipemia   . Hyperlipidemia   . Labyrinthitis   . Personal history of colonic polyps   . Skull fracture ([place]) 1955   from [place]   . Thrombocytopenia ([place])   . Tubular adenoma of colon   . Vitamin D deficiency     Past Surgical History:  Procedure Laterality Date  . COLON SURGERY     polyps   . COLONOSCOPY    . EYE SURGERY Bilateral    cataracts   . left knee surgery- microscopic  Left 80's   [facility]   . 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 TREATMENT FISTULA ANAL      There were no vitals filed for this visit.    Subjective Assessment - 07/24/20 1407    Subjective COVID-19 screen performed prior to patient entering clinic.   The patient presents to the clinic today with c/o low back with radiation of symtpoms to right hip to his right ankle.  He has had a h/o low back pain but has been able to keep it under control with core exercises and Meloxicam PRN.  About two weeks ago he had a significant flare-up of pain and also reports losing his balanace  off a 2 step ladder and hitting his right hip.  His pain is rated at a 4/10 today and rises to a 7-8/10 at night.    Pertinent History DDD, BPH.    Patient Stated Goals Reduce pain.    Currently in Pain? Yes    Pain Score 4     Pain Location Back    Pain Orientation Right    Pain Descriptors / Indicators Throbbing;Sharp;Numbness   Numbness in feet.   Pain Type Chronic pain    Pain Onset 1 to 4 weeks ago    Pain Frequency Constant    Aggravating Factors  Lying down at night.    Pain Relieving Factors Medication.              [facility] PT Assessment - 07/24/20 0001      Assessment   Medical Diagnosis Lumbar radiculopathy, right.    Referring Provider (PT) Ronnie Doss DO    Onset Date/Surgical Date --   ~2 weeks.     Precautions   Precautions None      Restrictions   Weight Bearing Restrictions No      Balance  Screen   Has the patient fallen in the past 6 months Yes    How many times? --   1.   Has the patient had a decrease in activity level because of a fear of falling?  Yes    Is the patient reluctant to leave their home because of a fear of falling?  No      Home Ecologist residence      Prior Function   Level of Independence Independent      Posture/Postural Control   Posture/Postural Control No significant limitations      Deep Tendon Reflexes   DTR Assessment Site Patella;Achilles    Patella DTR 2+    Achilles DTR 2+      ROM / Strength   AROM / PROM / Strength AROM;Strength      AROM   Overall AROM Comments Essentially normal lumbar spine AROM.      Strength   Overall Strength Comments Normal LE strength.      Palpation   Palpation comment Tender to palpation over right QL and right greater trochanter.      Special Tests   Other special tests (=) leg lengths; (-) RT SLR test; some pain with right FABER test.      Ambulation/Gait   Gait Comments Gait antalgia noted.                      Objective  measurements completed on examination: See above findings.       [facility] Adult PT Treatment/Exercise - 07/24/20 0001      Modalities   Modalities Electrical Stimulation;Moist Heat      Moist Heat Therapy   Number Minutes Moist Heat 20 Minutes    Moist Heat Location Lumbar Spine      Electrical Stimulation   Electrical Stimulation Location RT LB    Electrical Stimulation Action Pre-mod.    Electrical Stimulation Parameters 80-150 Hz x 20 minutes.    Electrical Stimulation Goals Tone;Pain                       PT Long Term Goals - 07/24/20 1428      PT LONG TERM GOAL #1   Title Independent with a HEP.    Time 6    Period Weeks    Status New      PT LONG TERM GOAL #2   Title Perform ADL's with pain not > 3/10.    Time 6    Period Weeks    Status New      PT LONG TERM GOAL #3   Title Sleep undisturbed 6 hours.    Time 6    Period Weeks    Status New                  Plan - 07/24/20 1423    Clinical Impression Statement The patient presents to [facility] with c/o low back pain with radiation especially down the length of his right LE to ankel and c/o both feet feeling numb.  He is found to be palpably tender over his right QL and greater trochanter.  His pain rises when trying to lie down at night.  His lumbar spinal range of motion is essentially normal and his LE strength is normal.  He is very familiar with lumbar stretching and core exercises.  Patient will benefit from skilled physical therapy intervention to address deficits and pain.  Personal Factors and Comorbidities Comorbidity 1    Comorbidities DDD, BPH.    Examination-Activity Limitations Locomotion Level;Sleep;Other    Examination-Participation Restrictions Other    Stability/Clinical Decision Making Evolving/Moderate complexity    Clinical Decision Making Low    Rehab Potential Excellent    PT Frequency 2x / week    PT Duration 6 weeks    PT Treatment/Interventions ADLs/Self Care Home  Management;Cryotherapy;Electrical Stimulation;Ultrasound;Traction;Moist Heat;Therapeutic activities;Therapeutic exercise;Manual techniques;Patient/family education;Passive range of motion;Spinal Manipulations    PT Next Visit Plan Core exercises and shoulder be able to advance quickly.  STW/M to right low back.  Modalties as needed.    Consulted and Agree with Plan of Care Patient           Patient will benefit from skilled therapeutic intervention in order to improve the following deficits and impairments:  Pain, Increased muscle spasms, Decreased activity tolerance  Visit Diagnosis: Acute right-sided low back pain without sciatica - Plan: PT plan of care cert/re-cert     Problem List Patient Active Problem List   Diagnosis Date Noted  . Lumbar spondylosis 07/21/2020  . PVC's (premature ventricular contractions) 01/02/2020  . Abnormal EKG 05/21/2018  . Palpitations 05/21/2018  . Aortic atherosclerosis ([place]) 10/05/2017  . Allergic rhinitis 03/30/2016  . Glaucoma 02/18/2015  . Thrombocytopenia ([place]) 09/24/2014  . DDD (degenerative disc disease), lumbar 10/08/2013  . Benign prostatic hyperplasia 05/30/2013  . Labyrinthitis 05/30/2013  . Hyperlipemia 03/12/2013  . Personal history of colonic polyps - adenomas 04/24/2008    Anoushka Divito, [place] MPT 07/24/2020, 2:31 PM  [facility] [address] [place], [place], [address] Phone: [phone]   Fax:  [phone]  Name: Dontee Jaso MRN: [id number] Date of Birth: 19-Jan-1936

## 2020-07-28 ENCOUNTER — Telehealth: Payer: Self-pay | Admitting: Family Medicine

## 2020-07-28 NOTE — Telephone Encounter (Signed)
Pt aware of provider feedback and voiced understanding and is ok with it. Pt would like to proceed with the lumbar spine and hip xray. Will forward to Melissa to schedule him to come in.

## 2020-07-28 NOTE — Telephone Encounter (Signed)
Patient came in to see you for a fall on 08/17. He was given prednisone and finished 08/23, was also given Depo-medrol injection on same day as visit done. PT on 08/20. Patient complains of pain at night mostly. He is thinking he needs and xray off foot and leg he thinks he might have fracture. Should he come back and see you or do you think he needs referral or xray?

## 2020-07-28 NOTE — Telephone Encounter (Signed)
Interesting.  He specifically denied any preceding injury.  If he now remembers a fall, he should have an xray.  He has known DDD on previous imaging.  Please order lumbar and unilateral hip xrays

## 2020-07-28 NOTE — Telephone Encounter (Signed)
Pt states his right ankle, knee and hip hurts and would like xray of ankle and knee as well. Please advise. He states his ankle pain is on a scale of 8 on a 10 pain scale. He says this is mostly at night but he is able to walk on it after stretching it and slight swelling.

## 2020-07-28 NOTE — Telephone Encounter (Signed)
So those findings are not consistent with a fracture.  I think that the pain he is experiencing is radiating from hip/ back.  I don't think knee and ankle xrays are indicated at this time.  These are likely swelling and painful at the end of the day due to change in gait.  He is welcome to a referral to ortho/ [facility] if he wants a second opinion but I think knee and ankle are unnecessary radiation

## 2020-07-29 NOTE — Telephone Encounter (Signed)
Called and notified the patient that he can come anytime before 5 and that my lunch is between 1230 and 130

## 2020-07-31 DIAGNOSIS — M5416 Radiculopathy, lumbar region: Secondary | ICD-10-CM | POA: Diagnosis not present

## 2020-07-31 DIAGNOSIS — M5136 Other intervertebral disc degeneration, lumbar region: Secondary | ICD-10-CM | POA: Diagnosis not present

## 2020-07-31 DIAGNOSIS — M545 Low back pain: Secondary | ICD-10-CM | POA: Diagnosis not present

## 2020-07-31 DIAGNOSIS — M4180 Other forms of scoliosis, site unspecified: Secondary | ICD-10-CM | POA: Diagnosis not present

## 2020-08-12 DIAGNOSIS — Z85828 Personal history of other malignant neoplasm of skin: Secondary | ICD-10-CM | POA: Diagnosis not present

## 2020-08-12 DIAGNOSIS — Z85820 Personal history of malignant melanoma of skin: Secondary | ICD-10-CM | POA: Diagnosis not present

## 2020-08-12 DIAGNOSIS — L821 Other seborrheic keratosis: Secondary | ICD-10-CM | POA: Diagnosis not present

## 2020-08-12 DIAGNOSIS — H401112 Primary open-angle glaucoma, right eye, moderate stage: Secondary | ICD-10-CM | POA: Diagnosis not present

## 2020-08-12 DIAGNOSIS — C44719 Basal cell carcinoma of skin of left lower limb, including hip: Secondary | ICD-10-CM | POA: Diagnosis not present

## 2020-08-12 DIAGNOSIS — Z1283 Encounter for screening for malignant neoplasm of skin: Secondary | ICD-10-CM | POA: Diagnosis not present

## 2020-08-12 DIAGNOSIS — L570 Actinic keratosis: Secondary | ICD-10-CM | POA: Diagnosis not present

## 2020-08-12 DIAGNOSIS — D229 Melanocytic nevi, unspecified: Secondary | ICD-10-CM | POA: Diagnosis not present

## 2020-08-12 DIAGNOSIS — L814 Other melanin hyperpigmentation: Secondary | ICD-10-CM | POA: Diagnosis not present

## 2020-08-12 DIAGNOSIS — L568 Other specified acute skin changes due to ultraviolet radiation: Secondary | ICD-10-CM | POA: Diagnosis not present

## 2020-08-12 DIAGNOSIS — H401123 Primary open-angle glaucoma, left eye, severe stage: Secondary | ICD-10-CM | POA: Diagnosis not present

## 2020-08-12 DIAGNOSIS — C4441 Basal cell carcinoma of skin of scalp and neck: Secondary | ICD-10-CM | POA: Diagnosis not present

## 2020-08-12 DIAGNOSIS — L812 Freckles: Secondary | ICD-10-CM | POA: Diagnosis not present

## 2020-08-14 DIAGNOSIS — N401 Enlarged prostate with lower urinary tract symptoms: Secondary | ICD-10-CM | POA: Diagnosis not present

## 2020-08-14 DIAGNOSIS — R351 Nocturia: Secondary | ICD-10-CM | POA: Diagnosis not present

## 2020-08-14 DIAGNOSIS — N5201 Erectile dysfunction due to arterial insufficiency: Secondary | ICD-10-CM | POA: Diagnosis not present

## 2020-08-17 DIAGNOSIS — M545 Low back pain: Secondary | ICD-10-CM | POA: Diagnosis not present

## 2020-08-21 DIAGNOSIS — M545 Low back pain, unspecified: Secondary | ICD-10-CM | POA: Insufficient documentation

## 2020-08-25 ENCOUNTER — Other Ambulatory Visit: Payer: Self-pay | Admitting: Family Medicine

## 2020-09-01 DIAGNOSIS — M5136 Other intervertebral disc degeneration, lumbar region: Secondary | ICD-10-CM | POA: Diagnosis not present

## 2020-09-04 ENCOUNTER — Ambulatory Visit: Payer: Medicare PPO | Attending: Orthopedic Surgery | Admitting: Physical Therapy

## 2020-09-04 ENCOUNTER — Telehealth: Payer: Self-pay

## 2020-09-04 DIAGNOSIS — M545 Low back pain, unspecified: Secondary | ICD-10-CM | POA: Insufficient documentation

## 2020-09-04 DIAGNOSIS — R293 Abnormal posture: Secondary | ICD-10-CM | POA: Insufficient documentation

## 2020-09-04 NOTE — Telephone Encounter (Signed)
Pt had an MRI on Tuesday for [facility]. He wants to know if Lajuana Ripple is aware of the results yet.

## 2020-09-04 NOTE — Telephone Encounter (Signed)
Lmtcb.

## 2020-09-04 NOTE — Telephone Encounter (Signed)
I do not see anything either.  Please have him contact his orthopedist.  They will review this result with them if they have results available.

## 2020-09-04 NOTE — Telephone Encounter (Signed)
Patient had MRI this week at [facility] and is wondering if you have seen any results.  The only correspondence I saw in his chart from them was an office visit note.  Have you seen anything?

## 2020-09-04 NOTE — Therapy (Signed)
[facility] [place], [place], [address] Phone: [phone]   Fax:  [phone]  Physical Therapy Evaluation  Patient Details  Name: Paul Parks MRN: [id number] Date of Birth: [birth year] Referring Provider (PT): Melina Schools MD   Encounter Date: 09/04/2020   PT End of Session - 09/04/20 1215    Visit Number 1    Number of Visits 12    Date for PT Re-Evaluation 10/16/20    PT Start Time 0940    PT Stop Time 1022    PT Time Calculation (min) 42 min    Activity Tolerance Patient tolerated treatment well           Past Medical History:  Diagnosis Date  . Allergic rhinitis   . Allergy   . Aortic atherosclerosis ([place])   . Bladder disorder    takes bladder control meds  . BPH (benign prostatic hyperplasia)   . Cataract   . DDD (degenerative disc disease)   . Diverticulosis   . Glaucoma   . Hyperlipemia   . Hyperlipidemia   . Labyrinthitis   . Personal history of colonic polyps   . Skull fracture ([place]) 1955   from [place]   . Thrombocytopenia ([place])   . Tubular adenoma of colon   . Vitamin D deficiency     Past Surgical History:  Procedure Laterality Date  . COLON SURGERY     polyps   . COLONOSCOPY    . EYE SURGERY Bilateral    cataracts   . left knee surgery- microscopic  Left 80's   [facility]   . 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 TREATMENT FISTULA ANAL      There were no vitals filed for this visit.    Subjective Assessment - 09/04/20 1052    Subjective COVID-19 screen performed prior to patient entering clinic.  The patient presents to the clinic today in severe pain and a flexed posture.  She states that he has gotten worse over the last several weeks.  He had an injection but unfortunately he received no lasting benefit from it.  The pain radiates from his right low back into his right buttock and hip region and travels to his lateral ankle region.  His pain was a  10+/10 upon getting out of bed this morning.    Pertinent History DDD, BPH.    Patient Stated Goals Reduce pain.    Currently in Pain? Yes    Pain Score 9     Pain Location Back    Pain Orientation Right    Pain Descriptors / Indicators Aching;Sharp;Shooting;Numbness    Pain Type Chronic pain              [facility] PT Assessment - 09/04/20 0001      Assessment   Medical Diagnosis Low back pain.    Referring Provider (PT) Melina Schools MD    Onset Date/Surgical Date --   Couple of months.     Precautions   Precautions None      Restrictions   Weight Bearing Restrictions No      Balance Screen   Has the patient fallen in the past 6 months Yes    How many times? --   1.   Has the patient had a decrease in activity level because of a fear of falling?  Yes    Is the patient reluctant to leave their home because of a  fear of falling?  No      Home Environment   Living Environment Private residence      Prior Function   Level of Independence Independent      Posture/Postural Control   Posture Comments Flexed trunk in response to pain.      AROM   Overall AROM Comments Active lumbar extension to 15 degrees and painful and flexion limited by 50% and painful as well.      Strength   Overall Strength Comments Normal LE strength.      Palpation   Palpation comment Tender to palpation over right Quadratus Lumborum and very tender over right SIJ (especially the posterior sacral iliac ligament).      Special Tests   Other special tests Equal leg lengths.  (+) right SLR test.                      Objective measurements completed on examination: See above findings.       [facility] Adult PT Treatment/Exercise - 09/04/20 0001      Modalities   Modalities Ultrasound      Ultrasound   Ultrasound Location Prone over pillow:  Combo e'stim/US at 1.50 W/CM2 x 8 minutes to patient's right low back.      Manual Therapy   Manual Therapy Soft tissue mobilization    Soft  tissue mobilization Prone over pillow:  STW/M trigger point release technique to patient's right QL x 5 minutes.                       PT Long Term Goals - 07/24/20 1428      PT LONG TERM GOAL #1   Title Independent with a HEP.    Time 6    Period Weeks    Status New      PT LONG TERM GOAL #2   Title Perform ADL's with pain not > 3/10.    Time 6    Period Weeks    Status New      PT LONG TERM GOAL #3   Title Sleep undisturbed 6 hours.    Time 6    Period Weeks    Status New                  Plan - 09/04/20 1241    Clinical Impression Statement The patient presenst to OPPT with worsening right-sided low that is radiating into his right hip, buttock to his lateral thigh.  He is tender to palpation over his right QL and very tender over his right posterior sacral iliac ligament.  He is in a flexed posture today due to pain.  He demonstrates a positive right SLR test.  His functional mobility is impaired and has been having a great deal of trouble getting out of bed in the mornings.  Patient will benefit from skilled physical therapy intervention to address deficits and pain.    Personal Factors and Comorbidities Comorbidity 1    Comorbidities DDD, BPH.    Examination-Activity Limitations Locomotion Level;Sleep;Other    Examination-Participation Restrictions Other    Stability/Clinical Decision Making Evolving/Moderate complexity    Clinical Decision Making Low    Rehab Potential Good    PT Frequency 2x / week    PT Duration 6 weeks    PT Treatment/Interventions ADLs/Self Care Home Management;Cryotherapy;Electrical Stimulation;Ultrasound;Traction;Moist Heat;Therapeutic activities;Therapeutic exercise;Manual techniques;Patient/family education;Passive range of motion;Spinal Manipulations;Iontophoresis 4mg /ml Dexamethasone    PT Next Visit Plan STW/M to  patient's right QL region and over his right posterior sacral iliac ligament; consider beginning traction at 30  to 35% body weight (54 to 63#), Combo e'stim/US and other modalites as needed.    Consulted and Agree with Plan of Care Patient           Patient will benefit from skilled therapeutic intervention in order to improve the following deficits and impairments:  Pain, Increased muscle spasms, Decreased activity tolerance, Decreased range of motion, Postural dysfunction  Visit Diagnosis: Acute right-sided low back pain without sciatica - Plan: PT plan of care cert/re-cert  Abnormal posture - Plan: PT plan of care cert/re-cert     Problem List Patient Active Problem List   Diagnosis Date Noted  . Lumbar spondylosis 07/21/2020  . PVC's (premature ventricular contractions) 01/02/2020  . Abnormal EKG 05/21/2018  . Palpitations 05/21/2018  . Aortic atherosclerosis ([place]) 10/05/2017  . Allergic rhinitis 03/30/2016  . Glaucoma 02/18/2015  . Thrombocytopenia ([place]) 09/24/2014  . DDD (degenerative disc disease), lumbar 10/08/2013  . Benign prostatic hyperplasia 05/30/2013  . Labyrinthitis 05/30/2013  . Hyperlipemia 03/12/2013  . Personal history of colonic polyps - adenomas 04/24/2008    Floye Fesler, [place] MPT 09/04/2020, 12:58 PM  [facility] [address] [place], [place], [address] Phone: [phone]   Fax:  [phone]  Name: Cristan Scherzer MRN: [id number] Date of Birth: Aug 12, 1936

## 2020-09-07 ENCOUNTER — Other Ambulatory Visit: Payer: Self-pay

## 2020-09-07 ENCOUNTER — Encounter: Payer: Self-pay | Admitting: Physical Therapy

## 2020-09-07 ENCOUNTER — Ambulatory Visit: Payer: Medicare PPO | Admitting: Physical Therapy

## 2020-09-07 DIAGNOSIS — R293 Abnormal posture: Secondary | ICD-10-CM

## 2020-09-07 DIAGNOSIS — M545 Low back pain, unspecified: Secondary | ICD-10-CM | POA: Diagnosis not present

## 2020-09-07 NOTE — Therapy (Signed)
[facility] [place], [place], [address] Phone: [phone]   Fax:  [phone]  Physical Therapy Treatment  Patient Details  Name: Paul Parks MRN: [id number] Date of Birth: May 12, 1936 Referring Provider (PT): Melina Schools MD   Encounter Date: 09/07/2020   PT End of Session - 09/07/20 1122    Visit Number 2    Number of Visits 12    Date for PT Re-Evaluation 10/16/20    Authorization Type FOTO.    PT Start Time 1122    PT Stop Time 1213    PT Time Calculation (min) 51 min    Activity Tolerance Patient tolerated treatment well    Behavior During Therapy WFL for tasks assessed/performed           Past Medical History:  Diagnosis Date  . Allergic rhinitis   . Allergy   . Aortic atherosclerosis ([place])   . Bladder disorder    takes bladder control meds  . BPH (benign prostatic hyperplasia)   . Cataract   . DDD (degenerative disc disease)   . Diverticulosis   . Glaucoma   . Hyperlipemia   . Hyperlipidemia   . Labyrinthitis   . Personal history of colonic polyps   . Skull fracture ([place]) 1955   from [place]   . Thrombocytopenia ([place])   . Tubular adenoma of colon   . Vitamin D deficiency     Past Surgical History:  Procedure Laterality Date  . COLON SURGERY     polyps   . COLONOSCOPY    . EYE SURGERY Bilateral    cataracts   . left knee surgery- microscopic  Left 80's   [facility]   . 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 TREATMENT FISTULA ANAL      There were no vitals filed for this visit.   Subjective Assessment - 09/07/20 1119    Subjective COVID 19 screening performed on patient upon arrival. Reports increased LBP pain at 8-9/10 upon arrival depending on activity. Walking makes pain worse. Sleeping in a chair as he cannot tolerate sleeping supine.    Pertinent History DDD, BPH.    Patient Stated Goals Reduce pain.    Currently in Pain? Yes    Pain Score 9     Pain  Location Back    Pain Orientation Right;Lower    Pain Descriptors / Indicators Aching;Throbbing;Sharp    Pain Type Chronic pain    Pain Onset 1 to 4 weeks ago    Pain Frequency Constant              [facility] PT Assessment - 09/07/20 0001      Assessment   Medical Diagnosis Low back pain.    Referring Provider (PT) Melina Schools MD      Precautions   Precautions None      Restrictions   Weight Bearing Restrictions No                         [facility] Adult PT Treatment/Exercise - 09/07/20 0001      Modalities   Modalities Ultrasound;Traction      Ultrasound   Ultrasound Location R QL, SI joint    Ultrasound Parameters Combo 1.5 w/cm2, 100%, 1 mhz x10 min   prone over pillows   Ultrasound Goals Pain      Traction   Type of Traction Lumbar    Min (lbs)  5    Max (lbs) 65    Hold Time 99    Rest Time 5    Time 15      Manual Therapy   Manual Therapy Soft tissue mobilization    Soft tissue mobilization STW to R Ql, SI joint to reduce tone and pain    prone over pillows                      PT Long Term Goals - 07/24/20 1428      PT LONG TERM GOAL #1   Title Independent with a HEP.    Time 6    Period Weeks    Status New      PT LONG TERM GOAL #2   Title Perform ADL's with pain not > 3/10.    Time 6    Period Weeks    Status New      PT LONG TERM GOAL #3   Title Sleep undisturbed 6 hours.    Time 6    Period Weeks    Status New                 Plan - 09/07/20 1219    Clinical Impression Statement Patient presented in clinic with reports of 8-9/10 R LBP with radicular pain to R ankle. Patient limited with sleeping as he can only tolerate sleeping in a chair and not fully supine. Patient also limited with any ADLs due to pain. Patient still tender over R SI joint and still presenting with moderate tone of R QL. Patient educated regarding lumbar traction which was initiated at 65# max. Patient reported increased pain with  transition movements from prone and supine to sidelying.    Personal Factors and Comorbidities Comorbidity 1    Comorbidities DDD, BPH.    Examination-Activity Limitations Locomotion Level;Sleep;Other    Examination-Participation Restrictions Other    Stability/Clinical Decision Making Evolving/Moderate complexity    Rehab Potential Good    PT Frequency 2x / week    PT Duration 6 weeks    PT Treatment/Interventions ADLs/Self Care Home Management;Cryotherapy;Electrical Stimulation;Ultrasound;Traction;Moist Heat;Therapeutic activities;Therapeutic exercise;Manual techniques;Patient/family education;Passive range of motion;Spinal Manipulations;Iontophoresis 4mg /ml Dexamethasone    PT Next Visit Plan STW/M to patient's right QL region and over his right posterior sacral iliac ligament; consider beginning traction at 30 to 35% body weight (54 to 63#), Combo e'stim/US and other modalites as needed.    Consulted and Agree with Plan of Care Patient           Patient will benefit from skilled therapeutic intervention in order to improve the following deficits and impairments:  Pain, Increased muscle spasms, Decreased activity tolerance, Decreased range of motion, Postural dysfunction  Visit Diagnosis: Acute right-sided low back pain without sciatica  Abnormal posture     Problem List Patient Active Problem List   Diagnosis Date Noted  . Lumbar spondylosis 07/21/2020  . PVC's (premature ventricular contractions) 01/02/2020  . Abnormal EKG 05/21/2018  . Palpitations 05/21/2018  . Aortic atherosclerosis ([place]) 10/05/2017  . Allergic rhinitis 03/30/2016  . Glaucoma 02/18/2015  . Thrombocytopenia ([place]) 09/24/2014  . DDD (degenerative disc disease), lumbar 10/08/2013  . Benign prostatic hyperplasia 05/30/2013  . Labyrinthitis 05/30/2013  . Hyperlipemia 03/12/2013  . Personal history of colonic polyps - adenomas 04/24/2008    Standley Brooking, PTA 09/07/2020, 12:25 PM  [facility] [address] [place], [place], [address] Phone: [phone]   Fax:  [phone]  Name:  Paul Parks MRN: [id number] Date of Birth: 1936/03/09

## 2020-09-09 ENCOUNTER — Ambulatory Visit: Payer: Medicare PPO | Admitting: Physical Therapy

## 2020-09-09 ENCOUNTER — Other Ambulatory Visit: Payer: Self-pay

## 2020-09-09 DIAGNOSIS — R293 Abnormal posture: Secondary | ICD-10-CM

## 2020-09-09 DIAGNOSIS — M545 Low back pain, unspecified: Secondary | ICD-10-CM

## 2020-09-09 NOTE — Therapy (Signed)
[facility] [place], [place], [address] Phone: [phone]   Fax:  [phone]  Physical Therapy Treatment  Patient Details  Name: Paul Parks MRN: [id number] Date of Birth: [birth year] Referring Provider (PT): Melina Schools MD   Encounter Date: 09/09/2020   PT End of Session - 09/09/20 1431    Visit Number 3    Number of Visits 12    Date for PT Re-Evaluation 10/16/20    Authorization Type FOTO.    PT Start Time [id number]    PT Stop Time 0239    PT Time Calculation (min) 53 min    Activity Tolerance Patient tolerated treatment well    Behavior During Therapy [facility] for tasks assessed/performed           Past Medical History:  Diagnosis Date  . Allergic rhinitis   . Allergy   . Aortic atherosclerosis ([place])   . Bladder disorder    takes bladder control meds  . BPH (benign prostatic hyperplasia)   . Cataract   . DDD (degenerative disc disease)   . Diverticulosis   . Glaucoma   . Hyperlipemia   . Hyperlipidemia   . Labyrinthitis   . Personal history of colonic polyps   . Skull fracture ([place]) 1955   from [place]   . Thrombocytopenia ([place])   . Tubular adenoma of colon   . Vitamin D deficiency     Past Surgical History:  Procedure Laterality Date  . COLON SURGERY     polyps   . COLONOSCOPY    . EYE SURGERY Bilateral    cataracts   . left knee surgery- microscopic  Left 80's   [facility]   . 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 TREATMENT FISTULA ANAL      There were no vitals filed for this visit.   Subjective Assessment - 09/09/20 1427    Subjective COVID-19 screen performed prior to patient entering clinic.  Felt better after treatme but pain comes back.    Pertinent History DDD, BPH.    Patient Stated Goals Reduce pain.    Currently in Pain? Yes    Pain Score 9     Pain Location Back    Pain Orientation Right;Lower    Pain Descriptors / Indicators Aching;Throbbing;Sharp     Pain Type Chronic pain    Pain Onset 1 to 4 weeks ago                             [facility] Adult PT Treatment/Exercise - 09/09/20 0001      Modalities   Modalities Traction;Ultrasound      Ultrasound   Ultrasound Location Prone over pillow for comfort:      Ultrasound Parameters Combo e'stim/US at 1.50 W/CM2 x 12 minutes to patient's right low back.    Ultrasound Goals Pain      Traction   Type of Traction Lumbar    Min (lbs) 5    Max (lbs) 75    Hold Time 99    Rest Time 5    Time 15      Manual Therapy   Manual Therapy Soft tissue mobilization    Soft tissue mobilization STW/M x 12 minutes to patient's right low back and sacral region.  PT Long Term Goals - 07/24/20 1428      PT LONG TERM GOAL #1   Title Independent with a HEP.    Time 6    Period Weeks    Status New      PT LONG TERM GOAL #2   Title Perform ADL's with pain not > 3/10.    Time 6    Period Weeks    Status New      PT LONG TERM GOAL #3   Title Sleep undisturbed 6 hours.    Time 6    Period Weeks    Status New                 Plan - 09/09/20 1432    Clinical Impression Statement patietn continues to be very tender to palpation over his right sacral region and was also found to be tender to palpation over his right upper gluteal region.  Temporary pain relief thus far from treatments.    Personal Factors and Comorbidities Comorbidity 1    Comorbidities DDD, BPH.    Examination-Activity Limitations Locomotion Level;Sleep;Other    Examination-Participation Restrictions Other    Stability/Clinical Decision Making Evolving/Moderate complexity    Rehab Potential Good    PT Frequency 2x / week    PT Duration 6 weeks    PT Treatment/Interventions ADLs/Self Care Home Management;Cryotherapy;Electrical Stimulation;Ultrasound;Traction;Moist Heat;Therapeutic activities;Therapeutic exercise;Manual techniques;Patient/family education;Passive  range of motion;Spinal Manipulations;Iontophoresis 4mg /ml Dexamethasone    PT Next Visit Plan STW/M to patient's right QL region and over his right posterior sacral iliac ligament; consider beginning traction at 30 to 35% body weight (54 to 63#), Combo e'stim/US and other modalites as needed.    Consulted and Agree with Plan of Care Patient           Patient will benefit from skilled therapeutic intervention in order to improve the following deficits and impairments:  Pain, Increased muscle spasms, Decreased activity tolerance, Decreased range of motion, Postural dysfunction  Visit Diagnosis: Acute right-sided low back pain without sciatica - Plan: PT plan of care cert/re-cert  Abnormal posture - Plan: PT plan of care cert/re-cert     Problem List Patient Active Problem List   Diagnosis Date Noted  . Lumbar spondylosis 07/21/2020  . PVC's (premature ventricular contractions) 01/02/2020  . Abnormal EKG 05/21/2018  . Palpitations 05/21/2018  . Aortic atherosclerosis ([place]) 10/05/2017  . Allergic rhinitis 03/30/2016  . Glaucoma 02/18/2015  . Thrombocytopenia ([place]) 09/24/2014  . DDD (degenerative disc disease), lumbar 10/08/2013  . Benign prostatic hyperplasia 05/30/2013  . Labyrinthitis 05/30/2013  . Hyperlipemia 03/12/2013  . Personal history of colonic polyps - adenomas 04/24/2008    Paul Parks, [place] MPT 09/09/2020, 2:40 PM  [facility] [address] [place], [place], [address] Phone: [phone]   Fax:  [phone]  Name: Paul Parks MRN: [id number] Date of Birth: 1936/06/04

## 2020-09-10 NOTE — Telephone Encounter (Signed)
Patient to contact ortho regarding MRI results

## 2020-09-14 ENCOUNTER — Encounter: Payer: Self-pay | Admitting: Physical Therapy

## 2020-09-14 ENCOUNTER — Ambulatory Visit: Payer: Medicare PPO | Admitting: Physical Therapy

## 2020-09-14 ENCOUNTER — Other Ambulatory Visit: Payer: Self-pay

## 2020-09-14 DIAGNOSIS — R293 Abnormal posture: Secondary | ICD-10-CM

## 2020-09-14 DIAGNOSIS — M545 Low back pain, unspecified: Secondary | ICD-10-CM

## 2020-09-14 NOTE — Therapy (Signed)
[facility] [place], [place], [address] Phone: [phone]   Fax:  [phone]  Physical Therapy Treatment  Patient Details  Name: Paul Parks MRN: [id number] Date of Birth: 08/24/1936 Referring Provider (PT): Melina Schools MD   Encounter Date: 09/14/2020   PT End of Session - 09/14/20 1155    Visit Number 4    Number of Visits 12    Date for PT Re-Evaluation 10/16/20    Authorization Type FOTO.    PT Start Time 1121    PT Stop Time 1216    PT Time Calculation (min) 55 min    Activity Tolerance Patient tolerated treatment well    Behavior During Therapy WFL for tasks assessed/performed           Past Medical History:  Diagnosis Date   Allergic rhinitis    Allergy    Aortic atherosclerosis ([place])    Bladder disorder    takes bladder control meds   BPH (benign prostatic hyperplasia)    Cataract    DDD (degenerative disc disease)    Diverticulosis    Glaucoma    Hyperlipemia    Hyperlipidemia    Labyrinthitis    Personal history of colonic polyps    Skull fracture ([place]) 1955   from [place]    Thrombocytopenia ([place])    Tubular adenoma of colon    Vitamin D deficiency     Past Surgical History:  Procedure Laterality Date   COLON SURGERY     polyps    COLONOSCOPY     EYE SURGERY Bilateral    cataracts    left knee surgery- microscopic  Left 80's   [facility]    SKIN LESION EXCISION  2017   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   Subjective Assessment - 09/14/20 1230    Subjective COVID-19 screen performed prior to patient entering clinic.  Reports ongoing pain, particularly in the morning time.    Pertinent History DDD, BPH.    Patient Stated Goals Reduce pain.    Currently in Pain? Yes    Pain Score 6     Pain Location Back    Pain Orientation Right;Lower    Pain Descriptors / Indicators  Aching;Throbbing    Pain Type Chronic pain    Pain Onset 1 to 4 weeks ago    Pain Frequency Constant              [facility] PT Assessment - 09/14/20 0001      Assessment   Medical Diagnosis Low back pain.    Referring Provider (PT) Melina Schools MD      Precautions   Precautions None      Restrictions   Weight Bearing Restrictions No                         [facility] Adult PT Treatment/Exercise - 09/14/20 0001      Modalities   Modalities Traction;Ultrasound      Ultrasound   Ultrasound Location prone over pillows R QL and SIJ    Ultrasound Parameters combo e-stim/US    Ultrasound Goals Pain      Traction   Type of Traction Lumbar    Min (lbs) 5    Max (lbs) 75    Hold Time 99    Rest Time 5    Time  15      Manual Therapy   Manual Therapy Soft tissue mobilization    Soft tissue mobilization STW/M x 12 minutes to patient's right low back and sacral region.                       PT Long Term Goals - 07/24/20 1428      PT LONG TERM GOAL #1   Title Independent with a HEP.    Time 6    Period Weeks    Status New      PT LONG TERM GOAL #2   Title Perform ADL's with pain not > 3/10.    Time 6    Period Weeks    Status New      PT LONG TERM GOAL #3   Title Sleep undisturbed 6 hours.    Time 6    Period Weeks    Status New                 Plan - 09/14/20 1207    Clinical Impression Statement Patient responded fairly to therapy session but with ongoing pain and tenderness to right upper glute and SIJ region. Patient and PT discussed plan of care for visits to which he reported understanding. Traction maintained at 75# max pull weight with no adverse affects.    Personal Factors and Comorbidities Comorbidity 1    Comorbidities DDD, BPH.    Examination-Activity Limitations Locomotion Level;Sleep;Other    Examination-Participation Restrictions Other    Stability/Clinical Decision Making Evolving/Moderate complexity    Clinical  Decision Making Low    Rehab Potential Good    PT Frequency 2x / week    PT Duration 6 weeks    PT Treatment/Interventions ADLs/Self Care Home Management;Cryotherapy;Electrical Stimulation;Ultrasound;Traction;Moist Heat;Therapeutic activities;Therapeutic exercise;Manual techniques;Patient/family education;Passive range of motion;Spinal Manipulations;Iontophoresis 4mg /ml Dexamethasone    PT Next Visit Plan STW/M to patient's right QL region and over his right posterior sacral iliac ligament; consider beginning traction at 30 to 35% body weight (54 to 63#), Combo e'stim/US and other modalites as needed.    Consulted and Agree with Plan of Care Patient           Patient will benefit from skilled therapeutic intervention in order to improve the following deficits and impairments:  Pain, Increased muscle spasms, Decreased activity tolerance, Decreased range of motion, Postural dysfunction  Visit Diagnosis: Acute right-sided low back pain without sciatica  Abnormal posture     Problem List Patient Active Problem List   Diagnosis Date Noted   Lumbar spondylosis 07/21/2020   PVC's (premature ventricular contractions) 01/02/2020   Abnormal EKG 05/21/2018   Palpitations 05/21/2018   Aortic atherosclerosis ([place]) 10/05/2017   Allergic rhinitis 03/30/2016   Glaucoma 02/18/2015   Thrombocytopenia ([place]) 09/24/2014   DDD (degenerative disc disease), lumbar 10/08/2013   Benign prostatic hyperplasia 05/30/2013   Labyrinthitis 05/30/2013   Hyperlipemia 03/12/2013   Personal history of colonic polyps - adenomas 04/24/2008    Gabriela Eves, PT, DPT 09/14/2020, 12:32 PM  [facility] [place], [place], [address] Phone: [phone]   Fax:  [phone]  Name: Connell Bognar MRN: [id number] Date of Birth: Sep 17, 1936

## 2020-09-15 DIAGNOSIS — M48061 Spinal stenosis, lumbar region without neurogenic claudication: Secondary | ICD-10-CM | POA: Insufficient documentation

## 2020-09-15 DIAGNOSIS — M48062 Spinal stenosis, lumbar region with neurogenic claudication: Secondary | ICD-10-CM | POA: Diagnosis not present

## 2020-09-15 DIAGNOSIS — M545 Low back pain, unspecified: Secondary | ICD-10-CM | POA: Diagnosis not present

## 2020-09-16 DIAGNOSIS — C4441 Basal cell carcinoma of skin of scalp and neck: Secondary | ICD-10-CM | POA: Diagnosis not present

## 2020-09-16 DIAGNOSIS — C44719 Basal cell carcinoma of skin of left lower limb, including hip: Secondary | ICD-10-CM | POA: Diagnosis not present

## 2020-09-17 ENCOUNTER — Other Ambulatory Visit: Payer: Self-pay

## 2020-09-17 ENCOUNTER — Ambulatory Visit: Payer: Medicare PPO | Admitting: *Deleted

## 2020-09-17 DIAGNOSIS — M545 Low back pain, unspecified: Secondary | ICD-10-CM | POA: Diagnosis not present

## 2020-09-17 DIAGNOSIS — R293 Abnormal posture: Secondary | ICD-10-CM | POA: Diagnosis not present

## 2020-09-17 NOTE — Therapy (Signed)
[facility] [place], [place], [address] Phone: [phone]   Fax:  [phone]  Physical Therapy Treatment  Patient Details  Name: Paul Parks MRN: [id number] Date of Birth: Aug 31, 1936 Referring Provider (PT): Melina Schools MD   Encounter Date: 09/17/2020   PT End of Session - 09/17/20 1452    Visit Number 5    Number of Visits 12    Date for PT Re-Evaluation 10/16/20    Authorization Type FOTO.    PT Start Time 1345    PT Stop Time 1435    PT Time Calculation (min) 50 min           Past Medical History:  Diagnosis Date  . Allergic rhinitis   . Allergy   . Aortic atherosclerosis ([place])   . Bladder disorder    takes bladder control meds  . BPH (benign prostatic hyperplasia)   . Cataract   . DDD (degenerative disc disease)   . Diverticulosis   . Glaucoma   . Hyperlipemia   . Hyperlipidemia   . Labyrinthitis   . Personal history of colonic polyps   . Skull fracture ([place]) 1955   from [place]   . Thrombocytopenia ([place])   . Tubular adenoma of colon   . Vitamin D deficiency     Past Surgical History:  Procedure Laterality Date  . COLON SURGERY     polyps   . COLONOSCOPY    . EYE SURGERY Bilateral    cataracts   . left knee surgery- microscopic  Left 80's   [facility]   . 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 TREATMENT FISTULA ANAL      There were no vitals filed for this visit.                      [facility] Adult PT Treatment/Exercise - 09/17/20 0001      Modalities   Modalities Traction;Ultrasound      Ultrasound   Ultrasound Location Prone over pillow    Ultrasound Parameters combo estim 1.5 w/cm2 x 10 mins RT SIJ    Ultrasound Goals Pain      Traction   Type of Traction Lumbar    Min (lbs) 5    Max (lbs) 80    Hold Time 99    Rest Time 5    Time 15      Manual Therapy   Manual Therapy Soft tissue mobilization    Soft tissue mobilization  STW/M x 13 minutes to patient's right low back and sacral region.                       PT Long Term Goals - 07/24/20 1428      PT LONG TERM GOAL #1   Title Independent with a HEP.    Time 6    Period Weeks    Status New      PT LONG TERM GOAL #2   Title Perform ADL's with pain not > 3/10.    Time 6    Period Weeks    Status New      PT LONG TERM GOAL #3   Title Sleep undisturbed 6 hours.    Time 6    Period Weeks    Status New                 Plan - 09/17/20 1453  Clinical Impression Statement Pt arrived today doing fair, and reports f/u with MD. MD said to cont PT and f/u with Dr [name]. Pt did well with Rx today and had notable tenderness RT SIJ, lower part RT QL and L5-S1 paras area. Pt did well with 80#s traction    Personal Factors and Comorbidities Comorbidity 1    Comorbidities DDD, BPH.    Examination-Activity Limitations Locomotion Level;Sleep;Other    Examination-Participation Restrictions Other    Stability/Clinical Decision Making Evolving/Moderate complexity    Rehab Potential Good    PT Frequency 2x / week    PT Duration 6 weeks    PT Treatment/Interventions ADLs/Self Care Home Management;Cryotherapy;Electrical Stimulation;Ultrasound;Traction;Moist Heat;Therapeutic activities;Therapeutic exercise;Manual techniques;Patient/family education;Passive range of motion;Spinal Manipulations;Iontophoresis 4mg /ml Dexamethasone;Dry needling    PT Next Visit Plan STW/M to patient's right QL region and over his right posterior sacral iliac ligament;  Assess traction at 80#s   traction , Combo e'stim/US and other modalites as needed.   DN next visit    Consulted and Agree with Plan of Care Patient           Patient will benefit from skilled therapeutic intervention in order to improve the following deficits and impairments:  Pain, Increased muscle spasms, Decreased activity tolerance, Decreased range of motion, Postural dysfunction  Visit  Diagnosis: Acute right-sided low back pain without sciatica  Abnormal posture     Problem List Patient Active Problem List   Diagnosis Date Noted  . Lumbar spondylosis 07/21/2020  . PVC's (premature ventricular contractions) 01/02/2020  . Abnormal EKG 05/21/2018  . Palpitations 05/21/2018  . Aortic atherosclerosis ([place]) 10/05/2017  . Allergic rhinitis 03/30/2016  . Glaucoma 02/18/2015  . Thrombocytopenia ([place]) 09/24/2014  . DDD (degenerative disc disease), lumbar 10/08/2013  . Benign prostatic hyperplasia 05/30/2013  . Labyrinthitis 05/30/2013  . Hyperlipemia 03/12/2013  . Personal history of colonic polyps - adenomas 04/24/2008    Paul Parks,Paul Parks, PTA 09/17/2020, 3:04 PM  [facility] [address] [place], [place], [address] Phone: [phone]   Fax:  [phone]  Name: Paul Parks MRN: [id number] Date of Birth: [birth year]

## 2020-09-19 ENCOUNTER — Other Ambulatory Visit: Payer: Self-pay | Admitting: Family Medicine

## 2020-09-19 DIAGNOSIS — M5416 Radiculopathy, lumbar region: Secondary | ICD-10-CM

## 2020-09-19 DIAGNOSIS — M47816 Spondylosis without myelopathy or radiculopathy, lumbar region: Secondary | ICD-10-CM

## 2020-09-19 DIAGNOSIS — M5136 Other intervertebral disc degeneration, lumbar region: Secondary | ICD-10-CM

## 2020-09-21 ENCOUNTER — Other Ambulatory Visit: Payer: Self-pay

## 2020-09-21 ENCOUNTER — Ambulatory Visit: Payer: Medicare PPO | Admitting: Physical Therapy

## 2020-09-21 ENCOUNTER — Encounter: Payer: Self-pay | Admitting: Physical Therapy

## 2020-09-21 DIAGNOSIS — M545 Low back pain, unspecified: Secondary | ICD-10-CM | POA: Diagnosis not present

## 2020-09-21 DIAGNOSIS — R293 Abnormal posture: Secondary | ICD-10-CM | POA: Diagnosis not present

## 2020-09-21 NOTE — Therapy (Signed)
[facility] [place], [place], [address] Phone: [phone]   Fax:  [phone]  Physical Therapy Treatment  Patient Details  Name: Paul Parks MRN: [id number] Date of Birth: 07/24/1936 Referring Provider (PT): Melina Schools MD   Encounter Date: 09/21/2020   PT End of Session - 09/21/20 1229    Visit Number 6    Number of Visits 12    Date for PT Re-Evaluation 10/16/20    Authorization Type FOTO.    PT Start Time 1132    PT Stop Time 1215    PT Time Calculation (min) 43 min    Activity Tolerance Patient tolerated treatment well    Behavior During Therapy WFL for tasks assessed/performed           Past Medical History:  Diagnosis Date  . Allergic rhinitis   . Allergy   . Aortic atherosclerosis ([place])   . Bladder disorder    takes bladder control meds  . BPH (benign prostatic hyperplasia)   . Cataract   . DDD (degenerative disc disease)   . Diverticulosis   . Glaucoma   . Hyperlipemia   . Hyperlipidemia   . Labyrinthitis   . Personal history of colonic polyps   . [place]   [place]   . Thrombocytopenia ([place])   . Tubular adenoma of colon   . Vitamin D deficiency     Past Surgical History:  Procedure Laterality Date  . COLON SURGERY     polyps   . COLONOSCOPY    . EYE SURGERY Bilateral    cataracts   . left knee surgery- microscopic  Left 80's   [facility]   . 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 TREATMENT FISTULA ANAL      There were no vitals filed for this visit.   Subjective Assessment - 09/21/20 1122    Subjective COVID-19 screen performed prior to patient entering clinic. Reports just as much pain in LBP and LE.    Pertinent History DDD, BPH.    Patient Stated Goals Reduce pain.    Currently in Pain? Yes    Pain Score 8     Pain Location Back    Pain Orientation Right;Lower    Pain Descriptors / Indicators Aching;Throbbing    Pain  Type Chronic pain    Pain Onset More than a month ago    Pain Frequency Constant              [facility] PT Assessment - 09/21/20 0001      Assessment   Medical Diagnosis Low back pain.    Referring Provider (PT) Melina Schools MD      Precautions   Precautions None      Restrictions   Weight Bearing Restrictions No                         [facility] Adult PT Treatment/Exercise - 09/21/20 0001      Modalities   Modalities Traction;Ultrasound      Ultrasound   Ultrasound Location R SI joint    Ultrasound Parameters Combo 1.5 w/cm2, 100%, 1 mhz x10 min    Ultrasound Goals Pain      Traction   Type of Traction Lumbar    Min (lbs) 5    Max (lbs) 100    Hold Time 99    Rest Time 5  Time 15      Manual Therapy   Manual Therapy Soft tissue mobilization    Soft tissue mobilization STW to R lumbar paraspinals, QL, SI joint to reduce pain and tone                       PT Long Term Goals - 07/24/20 1428      PT LONG TERM GOAL #1   Title Independent with a HEP.    Time 6    Period Weeks    Status New      PT LONG TERM GOAL #2   Title Perform ADL's with pain not > 3/10.    Time 6    Period Weeks    Status New      PT LONG TERM GOAL #3   Title Sleep undisturbed 6 hours.    Time 6    Period Weeks    Status New                 Plan - 09/21/20 1207    Clinical Impression Statement Patient presented in clinic with reports of continued high level R LBP and LE radicular pain. Patient is still very guarded with transfers and bed mobility. Patient also very tender to palpation over L5-S1 as well as R SI joint. Moderate tightness still present over R QL region as well. Mechanical lumbar traction increased to 100# per [place]i App[place], MPT recommendation. Patient reporting feeling some better after traction session completed.    Personal Factors and Comorbidities Comorbidity 1    Comorbidities DDD, BPH.    Examination-Activity Limitations  Locomotion Level;Sleep;Other    Examination-Participation Restrictions Other    Stability/Clinical Decision Making Evolving/Moderate complexity    Rehab Potential Good    PT Frequency 2x / week    PT Duration 6 weeks    PT Treatment/Interventions ADLs/Self Care Home Management;Cryotherapy;Electrical Stimulation;Ultrasound;Traction;Moist Heat;Therapeutic activities;Therapeutic exercise;Manual techniques;Patient/family education;Passive range of motion;Spinal Manipulations;Iontophoresis 4mg /ml Dexamethasone;Dry needling    PT Next Visit Plan STW/M to patient's right QL region and over his right posterior sacral iliac ligament;  Assess traction at 80#s   traction , Combo e'stim/US and other modalites as needed.   DN next visit    Consulted and Agree with Plan of Care Patient           Patient will benefit from skilled therapeutic intervention in order to improve the following deficits and impairments:  Pain, Increased muscle spasms, Decreased activity tolerance, Decreased range of motion, Postural dysfunction  Visit Diagnosis: Acute right-sided low back pain without sciatica  Abnormal posture     Problem List Patient Active Problem List   Diagnosis Date Noted  . Lumbar spondylosis 07/21/2020  . PVC's (premature ventricular contractions) 01/02/2020  . Abnormal EKG 05/21/2018  . Palpitations 05/21/2018  . Aortic atherosclerosis ([place]) 10/05/2017  . Allergic rhinitis 03/30/2016  . Glaucoma 02/18/2015  . Thrombocytopenia ([place]) 09/24/2014  . DDD (degenerative disc disease), lumbar 10/08/2013  . Benign prostatic hyperplasia 05/30/2013  . Labyrinthitis 05/30/2013  . Hyperlipemia 03/12/2013  . Personal history of colonic polyps - adenomas 04/24/2008    Standley Brooking, PTA 09/21/2020, 12:30 PM  [facility] [address] [place], [place], [address] Phone: [phone]   Fax:  [phone]  Name: Paul Parks MRN: [id number] Date of  Birth: 1935-12-14

## 2020-09-22 DIAGNOSIS — R351 Nocturia: Secondary | ICD-10-CM | POA: Diagnosis not present

## 2020-09-22 DIAGNOSIS — N5201 Erectile dysfunction due to arterial insufficiency: Secondary | ICD-10-CM | POA: Diagnosis not present

## 2020-09-22 DIAGNOSIS — N401 Enlarged prostate with lower urinary tract symptoms: Secondary | ICD-10-CM | POA: Diagnosis not present

## 2020-09-24 ENCOUNTER — Other Ambulatory Visit: Payer: Self-pay

## 2020-09-24 ENCOUNTER — Ambulatory Visit: Payer: Medicare PPO | Admitting: Physical Therapy

## 2020-09-24 DIAGNOSIS — M545 Low back pain, unspecified: Secondary | ICD-10-CM | POA: Diagnosis not present

## 2020-09-24 DIAGNOSIS — R293 Abnormal posture: Secondary | ICD-10-CM | POA: Diagnosis not present

## 2020-09-24 NOTE — Therapy (Addendum)
[facility] [place], [place], [address] Phone: [phone]   Fax:  [phone]  Physical Therapy Treatment PHYSICAL THERAPY DISCHARGE SUMMARY  Visits from Start of Care: 7  Current functional level related to goals / functional outcomes: See below   Remaining deficits: See goals   Education / Equipment: HEP Plan: Patient agrees to discharge.  Patient goals were not met. Patient is being discharged due to not returning since the last visit.  ?????     Patient Details  Name: Paul Parks MRN: [id number] Date of Birth: 1936/02/20 Referring Provider (PT): Melina Schools MD   Encounter Date: 09/24/2020   PT End of Session - 09/24/20 1401    Visit Number 7    Number of Visits 12    Date for PT Re-Evaluation 10/16/20    Authorization Type FOTO.    PT Start Time 0145    PT Stop Time 0229    PT Time Calculation (min) 44 min    Activity Tolerance Patient tolerated treatment well    Behavior During Therapy [facility] for tasks assessed/performed           Past Medical History:  Diagnosis Date  . Allergic rhinitis   . Allergy   . Aortic atherosclerosis ([place])   . Bladder disorder    takes bladder control meds  . BPH (benign prostatic hyperplasia)   . Cataract   . DDD (degenerative disc disease)   . Diverticulosis   . Glaucoma   . Hyperlipemia   . Hyperlipidemia   . Labyrinthitis   . Personal history of colonic polyps   . Skull fracture ([place]) 1955   from [place]   . Thrombocytopenia ([place])   . Tubular adenoma of colon   . Vitamin D deficiency     Past Surgical History:  Procedure Laterality Date  . COLON SURGERY     polyps   . COLONOSCOPY    . EYE SURGERY Bilateral    cataracts   . left knee surgery- microscopic  Left 80's   [facility]   . 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 TREATMENT FISTULA ANAL      There were no vitals filed for this visit.   Subjective Assessment -  09/24/20 1350    Subjective COVID-19 screen performed prior to patient entering clinic.Marland Kitchen Patient arrived with ogoing pain    Pertinent History DDD, BPH.    Currently in Pain? Yes    Pain Score 6     Pain Location Back    Pain Orientation Right;Lower    Pain Descriptors / Indicators Discomfort;Aching    Pain Type Chronic pain    Pain Onset More than a month ago    Pain Frequency Constant    Aggravating Factors  prolong sitting    Pain Relieving Factors standing                             [facility] Adult PT Treatment/Exercise - 09/24/20 0001      Ultrasound   Ultrasound Location R SI Joint    Ultrasound Parameters combo US/ES @1 .5w/cm2/100%/5mz x166m    Ultrasound Goals Pain      Traction   Type of Traction Lumbar    Min (lbs) 5    Max (lbs) 100    Hold Time 99    Rest Time 5    Time 15  Manual Therapy   Manual Therapy Soft tissue mobilization    Soft tissue mobilization STW to R lumbar paraspinals, QL, SI joint to reduce pain and tone                       PT Long Term Goals - 09/24/20 1413      PT LONG TERM GOAL #1   Title Independent with a HEP.    Time 6    Period Weeks    Status On-going      PT LONG TERM GOAL #2   Title Perform ADL's with pain not > 3/10.    Baseline 6-7/10 09/24/20    Time 6    Period Weeks    Status On-going      PT LONG TERM GOAL #3   Title Sleep undisturbed 6 hours.    Baseline few hours 09/24/20    Time 6    Period Weeks    Status On-going                 Plan - 09/24/20 1427    Clinical Impression Statement Patient tolerated treatment well today. Patient has reported relief yet only temporary at this time. He has done well with all therapy. Today educated patient on posture awareness techniques and core activation exercises and patient has already been doing these for some time due to previous back problems. Patient current goals ongoing due to pain limitations. Patient limited with  sleeping and any one position too long esp sitting.    Personal Factors and Comorbidities Comorbidity 1    Comorbidities DDD, BPH.    Examination-Activity Limitations Locomotion Level;Sleep;Other    Examination-Participation Restrictions Other    Stability/Clinical Decision Making Evolving/Moderate complexity    Rehab Potential Good    PT Frequency 2x / week    PT Duration 6 weeks    PT Treatment/Interventions ADLs/Self Care Home Management;Cryotherapy;Electrical Stimulation;Ultrasound;Traction;Moist Heat;Therapeutic activities;Therapeutic exercise;Manual techniques;Patient/family education;Passive range of motion;Spinal Manipulations;Iontophoresis 76m/ml Dexamethasone;Dry needling    PT Next Visit Plan STW/M to patient's right QL region and over his right posterior sacral iliac ligament;  Assess traction at 80#s   traction , Combo e'stim/US and other modalites as needed.   DN next visit    Consulted and Agree with Plan of Care Patient           Patient will benefit from skilled therapeutic intervention in order to improve the following deficits and impairments:  Pain, Increased muscle spasms, Decreased activity tolerance, Decreased range of motion, Postural dysfunction  Visit Diagnosis: Acute right-sided low back pain without sciatica  Abnormal posture     Problem List Patient Active Problem List   Diagnosis Date Noted  . Lumbar spondylosis 07/21/2020  . PVC's (premature ventricular contractions) 01/02/2020  . Abnormal EKG 05/21/2018  . Palpitations 05/21/2018  . Aortic atherosclerosis ([place] 10/05/2017  . Allergic rhinitis 03/30/2016  . Glaucoma 02/18/2015  . [place]ocyt[place] ([place] 09/24/2014  . DDD (degenerative disc disease), lumbar 10/08/2013  . Benign prostatic hyperplasia 05/30/2013  . Labyrinthitis 05/30/2013  . Hyperlipemia 03/12/2013  . Personal history of colonic polyps - adenomas 04/24/2008    DPhillips Climes PTA 09/24/2020, 2:48 PM  [facility] [address]Phone: [phone]  Fax:  [phone] Name: Paul MeyerhoffMRN: [id number] of Birth: 811/06/1936

## 2020-09-30 DIAGNOSIS — M79671 Pain in right foot: Secondary | ICD-10-CM | POA: Diagnosis not present

## 2020-09-30 DIAGNOSIS — M25571 Pain in right ankle and joints of right foot: Secondary | ICD-10-CM | POA: Diagnosis not present

## 2020-10-02 ENCOUNTER — Telehealth: Payer: Self-pay | Admitting: *Deleted

## 2020-10-02 DIAGNOSIS — M5117 Intervertebral disc disorders with radiculopathy, lumbosacral region: Secondary | ICD-10-CM | POA: Diagnosis not present

## 2020-10-02 DIAGNOSIS — M5137 Other intervertebral disc degeneration, lumbosacral region: Secondary | ICD-10-CM | POA: Diagnosis not present

## 2020-10-02 DIAGNOSIS — M5416 Radiculopathy, lumbar region: Secondary | ICD-10-CM | POA: Diagnosis not present

## 2020-10-02 DIAGNOSIS — R001 Bradycardia, unspecified: Secondary | ICD-10-CM

## 2020-10-02 NOTE — Telephone Encounter (Signed)
I have placed a stat referral to Dr. Lovena Le, his electrophysiologist. I will also place a stat referral to Dr. Percival Spanish. I am quite concerned that his pulse has dropped into the 30s as he is typically in the 60s. Should he develop any lightheadedness, blurred vision, shortness of breath, extreme fatigue or presyncopal symptoms he is to seek immediate medical attention emergency department.

## 2020-10-02 NOTE — Telephone Encounter (Signed)
Patient aware and verbalizes understanding. 

## 2020-10-02 NOTE — Telephone Encounter (Signed)
Dr [name] went ahead with procedure today. He is home now. [phone]  They rechecked #s: 144/80 - 38 156/78 - 44 142/76 - 18  Dr [name] told them they would be waiting a long time in the ER and advised them to call his cardio office.  They called Dr Cherlyn Cushing office and they may can see him - but needs referral for insurance.  STAT referral needed ( if you choose this route)   I still continued to say ER was needed, but they want Dr Darnell Level opinion.   Pt states that he feels normal  Please advise.

## 2020-10-02 NOTE — Addendum Note (Signed)
Addended by: [name] on: 10/02/2020 01:14 PM   Modules accepted: Orders

## 2020-10-05 ENCOUNTER — Other Ambulatory Visit: Payer: Self-pay | Admitting: Family Medicine

## 2020-10-05 ENCOUNTER — Other Ambulatory Visit: Payer: Self-pay

## 2020-10-05 ENCOUNTER — Ambulatory Visit (INDEPENDENT_AMBULATORY_CARE_PROVIDER_SITE_OTHER): Payer: Medicare PPO | Admitting: Family Medicine

## 2020-10-05 ENCOUNTER — Telehealth: Payer: Self-pay | Admitting: Family Medicine

## 2020-10-05 ENCOUNTER — Ambulatory Visit (INDEPENDENT_AMBULATORY_CARE_PROVIDER_SITE_OTHER): Payer: Medicare PPO

## 2020-10-05 VITALS — BP 138/50 | HR 31 | Temp 96.9°F

## 2020-10-05 DIAGNOSIS — R600 Localized edema: Secondary | ICD-10-CM | POA: Diagnosis not present

## 2020-10-05 DIAGNOSIS — R001 Bradycardia, unspecified: Secondary | ICD-10-CM | POA: Diagnosis not present

## 2020-10-05 DIAGNOSIS — I493 Ventricular premature depolarization: Secondary | ICD-10-CM

## 2020-10-05 DIAGNOSIS — R011 Cardiac murmur, unspecified: Secondary | ICD-10-CM

## 2020-10-05 DIAGNOSIS — Z0130 Encounter for examination of blood pressure without abnormal findings: Secondary | ICD-10-CM

## 2020-10-05 NOTE — [facility] Notes (Signed)
Subjective: CC: Bradycardia PCP: [name], DO [id number] Paul Parks is a 84 y.o. male presenting to clinic today for:  1.  Bradycardia Patient was noted to be bradycardic into the 30s at Dr. [name]' office on Friday.  This was a new occurrence in his wife has been checking his pulse since then.  He is essentially fluctuated between the 30s and low 50s but primarily has remained in the 30s.  His blood pressures have been primarily in the 756E to 332R systolic.  He denies any symptomology including fatigue, chest pain, shortness of breath, change in exercise tolerance.  However he does note some lower extremity edema that seems to have been present for only last couple of weeks.  He initially thought this was due to use of gabapentin and then later NSAIDs, both of which he is discontinued.  He had apparently had a rash when he started the gabapentin.  The rash resolved after discontinuation but then returned after he started on ibuprofen.  The rash is getting better now that he has discontinued both of these medicines.  He has had no changes in medications otherwise.  He has an appointment with cardiology on Thursday.  He will be seeing one of the physician assistants at the [facility] heart care facility.   ROS: Per HPI  Allergies  Allergen Reactions  . Sulfa Antibiotics Itching    Rash   . Gabapentin   . Vesicare [Solifenacin] Other (See Comments)    HA    Past Medical History:  Diagnosis Date  . Allergic rhinitis   . Allergy   . Aortic atherosclerosis ([place])   . Bladder disorder    takes bladder control meds  . BPH (benign prostatic hyperplasia)   . Cataract   . DDD (degenerative disc disease)   . Diverticulosis   . Glaucoma   . Hyperlipemia   . Hyperlipidemia   . Labyrinthitis   . Personal history of colonic polyps   . Skull fracture ([place]) 1955   from [place]   . Thrombocytopenia ([place])   . Tubular adenoma of colon   . Vitamin D deficiency     Current Outpatient  Medications:  .  alfuzosin (UROXATRAL) 10 MG 24 hr tablet, , Disp: , Rfl:  .  amLODipine (NORVASC) 2.5 MG tablet, Take 1 tablet (2.5 mg total) by mouth daily., Disp: 90 tablet, Rfl: 3 .  aspirin 81 MG tablet, Take 81 mg by mouth daily., Disp: , Rfl:  .  cholecalciferol (VITAMIN D) 1000 UNITS tablet, Take 2,000 Units by mouth daily. , Disp: , Rfl:  .  fluticasone (FLONASE) 50 MCG/ACT nasal spray, Place 1 spray into both nostrils daily., Disp: , Rfl:  .  ibuprofen (ADVIL) 600 MG tablet, TAKE 1 TABLET (600 MG TOTAL) BY MOUTH EVERY 8 (EIGHT) HOURS AS NEEDED FOR MODERATE PAIN., Disp: 30 tablet, Rfl: 1 .  meclizine (ANTIVERT) 12.5 MG tablet, Take one (1) tablet (12.5 mg) by mouth four (4) times daily with meals and at bedtime for dizziness., Disp: , Rfl:  .  Multiple Vitamin (MULTIVITAMIN WITH MINERALS) TABS tablet, Take 1 tablet by mouth daily., Disp: , Rfl:  .  predniSONE (STERAPRED UNI-PAK 21 TAB) 10 MG (21) TBPK tablet, As directed x 6 days. Start 8/18., Disp: 21 tablet, Rfl: 0 .  rosuvastatin (CRESTOR) 10 MG tablet, TAKE 1 TABLET BY MOUTH 2 TIMES A WEEK, Disp: 12 tablet, Rfl: 1 .  tadalafil (CIALIS) 10 MG tablet, Take 1 tablet (10 mg total) by mouth  daily as needed for erectile dysfunction., Disp: 10 tablet, Rfl: 12 .  TRAVATAN Z 0.004 % SOLN ophthalmic solution, Place 1 drop into both eyes at bedtime. , Disp: , Rfl: 11  Current Facility-Administered Medications:  .  0.9 %  sodium chloride infusion, 500 mL, Intravenous, Once, Pyrtle, [name], MD Social History   Socioeconomic History  . Marital status: Married    Spouse name: [name]  . Number of children: 2  . Years of education: Not on file  . Highest education level: Not on file  Occupational History  . 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 Smoking status: Never Smoker  . Smokeless tobacco: Never Used  Vaping Use  . Vaping Use: Never used  Substance and Sexual Activity  .  Alcohol use: Yes    Alcohol/week: 0.0 standard drinks    Comment: every 2 months 1-2 beers                            . Drug use: No  . Sexual activity: Yes  Other Topics Concern  . Not on file  Social History Narrative   Lives at home with wife, [name]    Social Determinants of Health   Financial Resource Strain:   . Difficulty of Paying Living Expenses: Not on file  Food Insecurity:   . Worried About Charity fundraiser in the Last Year: Not on file  . Ran Out of Food in the Last Year: Not on file  Transportation Needs:   . Lack of Transportation (Medical): Not on file  . Lack of Transportation (Non-Medical): Not on file  Physical Activity:   . Days of Exercise per Week: Not on file  . Minutes of Exercise per Session: Not on file  Stress:   . Feeling of Stress : Not on file  Social Connections:   . Frequency of Communication with Friends and Family: Not on file  . Frequency of Social Gatherings with Friends and Family: Not on file  . Attends Religious Services: Not on file  . Active Member of Clubs or Organizations: Not on file  . Attends Archivist Meetings: Not on file  . Marital Status: Not on file  Intimate Partner Violence:   . Fear of Current or Ex-Partner: Not on file  . Emotionally Abused: Not on file  . Physically Abused: Not on file  . Sexually Abused: Not on file   Family History  Problem Relation Age of Onset  . Heart disease Mother   . Diabetes Mother        diet controlled  . Other Mother        tetanus / lock jaw  . Heart disease Brother   . Emphysema Brother        smoker  . Heart disease Father   . Emphysema Father        smoker  . Glaucoma Father   . Cancer Sister        brain and stomach / colon   . Emphysema Sister        smoker  . Lumbar disc disease Sister   . Hyperlipidemia Sister   . Hypertension Sister   . Heart disease Sister   . Polycythemia Son   . Heart disease Sister   . Hyperlipidemia Son   . Colon cancer Neg Hx    . Esophageal  cancer Neg Hx   . Rectal cancer Neg Hx   . Stomach cancer Neg Hx     Objective: Office vital signs reviewed. BP (!) 138/50   Pulse (!) 31   Temp (!) 96.9 F (36.1 C) (Temporal)   SpO2 97%   Physical Examination:  General: Awake, alert, well nourished, nontoxic appearing. No acute distress HEENT: Normal, sclera white. Cardio: Bradycardic with extra beats noted, S1S2 heard, 2 out of 6 SEM noted along the right sternal border Pulm: clear to auscultation bilaterally, no wheezes, rhonchi or rales; normal work of breathing on room air Extremities: Cool, 1-2+ pitting edema noted to posterior thighs bilaterally Skin: Stasis dermatitis noted along the ankles  Assessment/ Plan: 84 y.o. male   1. Bradycardia I reviewed his EKG which shows PVCs and bradycardia.  Given his new onset lower extremity edema, increased bradycardia, newly recognized heart murmur I highly recommend that he sees a cardiologist.  I worry that he has had an event causing said symptoms.  I agree with avoidance of NSAIDs and gabapentin as these exacerbate lower extremity edema.  We discussed the potential for addition of Lasix but I would like to see what his electrolytes look like prior to initiation.  I will CC his results to his cardiologist.  I attempted to add a BNP but unfortunately patient required a new stick and he had left for the day.  He has an appointment on Thursday but may need to be seen sooner pending their evaluation  2. PVC (premature ventricular contraction) Noted on EKG  3. Newly recognized heart murmur I do not recall previous heart murmur.  This is a systolic heart murmur. - Brain natriuretic peptide  4. Lower extremity edema Likely will give Lasix but would like to see what electrolytes look like prior.  Reinforced elevation of lower extremities, compression hose for now. - Brain natriuretic peptide   No orders of the defined types were placed in this encounter.  No orders of  the defined types were placed in this encounter.    [name], DO [place] [phone]

## 2020-10-05 NOTE — Telephone Encounter (Signed)
Spoke with pt and he denies presyncopal symptoms, lightheaded, dizziness, extreme fatigue, SOB or visual disturbances. Did you want him to come in for BP check with nurse or please advise?

## 2020-10-05 NOTE — Telephone Encounter (Signed)
I think a BP check would be great.  If we could somehow get him an EKG too that would be ideal.

## 2020-10-05 NOTE — Telephone Encounter (Signed)
Nurse visit for BP and EKG set up with triage

## 2020-10-05 NOTE — [facility] Notes (Signed)
B/P- 148/58  HR-32  EKG performed- Dr.  Darnell Level spoke with patient about his EKG

## 2020-10-06 ENCOUNTER — Other Ambulatory Visit: Payer: Self-pay | Admitting: Family Medicine

## 2020-10-06 ENCOUNTER — Ambulatory Visit (INDEPENDENT_AMBULATORY_CARE_PROVIDER_SITE_OTHER): Payer: Medicare PPO

## 2020-10-06 DIAGNOSIS — I493 Ventricular premature depolarization: Secondary | ICD-10-CM | POA: Diagnosis not present

## 2020-10-06 DIAGNOSIS — Z23 Encounter for immunization: Secondary | ICD-10-CM

## 2020-10-06 LAB — CBC WITH DIFFERENTIAL/PLATELET
Basophils Absolute: 0 10*3/uL (ref 0.0–0.2)
Basos: 0 %
EOS (ABSOLUTE): 0.1 10*3/uL (ref 0.0–0.4)
Eos: 1 %
Hematocrit: 37.9 % (ref 37.5–51.0)
Hemoglobin: 12.6 g/dL — ABNORMAL LOW (ref 13.0–17.7)
Immature Grans (Abs): 0 10*3/uL (ref 0.0–0.1)
Immature Granulocytes: 0 %
Lymphocytes Absolute: 2.2 10*3/uL (ref 0.7–3.1)
Lymphs: 30 %
MCH: 30.7 pg (ref 26.6–33.0)
MCHC: 33.2 g/dL (ref 31.5–35.7)
MCV: 92 fL (ref 79–97)
Monocytes Absolute: 0.5 10*3/uL (ref 0.1–0.9)
Monocytes: 8 %
Neutrophils Absolute: 4.3 10*3/uL (ref 1.4–7.0)
Neutrophils: 61 %
Platelets: 159 10*3/uL (ref 150–450)
RBC: 4.1 x10E6/uL — ABNORMAL LOW (ref 4.14–5.80)
RDW: 12 % (ref 11.6–15.4)
WBC: 7.2 10*3/uL (ref 3.4–10.8)

## 2020-10-06 LAB — CMP14+EGFR
ALT: 29 IU/L (ref 0–44)
AST: 25 IU/L (ref 0–40)
Albumin/Globulin Ratio: 2.3 — ABNORMAL HIGH (ref 1.2–2.2)
Albumin: 4.3 g/dL (ref 3.6–4.6)
Alkaline Phosphatase: 47 IU/L (ref 44–121)
BUN/Creatinine Ratio: 14 (ref 10–24)
BUN: 16 mg/dL (ref 8–27)
Bilirubin Total: 0.4 mg/dL (ref 0.0–1.2)
CO2: 26 mmol/L (ref 20–29)
Calcium: 9.3 mg/dL (ref 8.6–10.2)
Chloride: 100 mmol/L (ref 96–106)
Creatinine, Ser: 1.14 mg/dL (ref 0.76–1.27)
GFR calc Af Amer: 69 mL/min/{1.73_m2} (ref >59)
GFR calc non Af Amer: 60 mL/min/{1.73_m2} (ref >59)
Globulin, Total: 1.9 g/dL (ref 1.5–4.5)
Glucose: 96 mg/dL (ref 65–99)
Potassium: 4.5 mmol/L (ref 3.5–5.2)
Sodium: 138 mmol/L (ref 134–144)
Total Protein: 6.2 g/dL (ref 6.0–8.5)

## 2020-10-06 LAB — THYROID PANEL WITH TSH
Free Thyroxine Index: 1.4 (ref 1.2–4.9)
T3 Uptake Ratio: 27 % (ref 24–39)
T4, Total: 5.2 ug/dL (ref 4.5–12.0)
TSH: 2.81 u[IU]/mL (ref 0.450–4.500)

## 2020-10-06 LAB — MAGNESIUM: Magnesium: 2.2 mg/dL (ref 1.6–2.3)

## 2020-10-06 MED ORDER — FUROSEMIDE 40 MG PO TABS: 40.0000 mg | ORAL_TABLET | Freq: Every day | ORAL | 0 refills | Status: DC

## 2020-10-08 NOTE — [facility] Notes (Signed)
Cardiology Office Note Date:  10/08/2020  Patient ID:  Paul, Parks 1936/02/02, MRN [id number] PCP:  [name], DO  Cardiologist/Electrophysiologist: Dr. Lovena Le     Chief Complaint: bradycardia  History of Present Illness: Paul Parks is a 84 y.o. male with history of HTN, HLD, Sinus node dysfunction, and PVCs  He comes today to be seen for Dr. Lovena Le, last seen by him jan 2021, he was doing well, only c/o sore leg after an injury. He had no symptoms of bradycardia, or PVCs,  No changes were made.  He had been found to be bradycardiac by another provider, his wife started monitoring noting his HR fluctuating between 30's-50's.  BP 140's-150's.  New for a couple weeks LE swelling (initially thought to be secondary to gabapentin or ibuprofen, both stopped and edema did not resolve), denied any symptoms.  Was also noted to have new murmur on exam There was concern of cardiac clinic change and referred back for evaluation. EKG noted V bigeminy, SR Labs looked ok and started on lasix for a few days.   TODAY He comes today accompanied by his wife. He tells me that outside of his back problem he feels well. August he was n a small ladder looking up to work with ceiling tile, lost his balance and fell onto a near by table  He did not have syncope or near syncope.  Mentions occassionally when he looks up quickly he will get a bit off balance or dizzy. He hurt his back and has had significant pain including radiating pain especially to his R leg. He has been following with ortho and doing PT without much help.  Has had a couple of injections with temporary improvement. He was started on gabapentin and developed a rash particularly to left lower leg, somewhat painful and cause associated swelling.  He stopped it and the rash improved, started Ibuprofen and the same thing happened, developed  Very uncomfortable rash though to his let lower leg with swelling again.  He stopped it  and the rash improved significantly the swelling though (b/l, L>R) remains, the skin on his LLE remains slightly erythematous but macular and raised rash is resolved.  During on of his his ortho visits and injections somewhere along the line someone found his pulse slow, and since then his wife has been monitoring it and their BP machine regularly gets HR 30's some 50's-60 range  Prior to his back injury he felt very well, was active without any kind of symptoms or exertional intolerances. He denies any kind of CP, palpitations or cardiac awareness, no SOB at rest or with exertion, no symptoms of PND or orthopnea, no near syncope or syncope. No weak spells, no unusual fatigue of any kind. He continues to feel well outside of his back back and leg pain.   His PMD gave him 3 days of lasix 40mg  and his swelling has improved but not resolved, his wife mentions on the right was starting to move just above the knee even.  This has significantly improved  Past Medical History:  Diagnosis Date  . Allergic rhinitis   . Allergy   . Aortic atherosclerosis ([place])   . Bladder disorder    takes bladder control meds  . BPH (benign prostatic hyperplasia)   . Cataract   . DDD (degenerative disc disease)   . Diverticulosis   . Glaucoma   . Hyperlipemia   . Hyperlipidemia   . Labyrinthitis   . Personal history of  colonic polyps   . Skull fracture ([place]) 1955   from [place]   . Thrombocytopenia ([place])   . Tubular adenoma of colon   . Vitamin D deficiency     Past Surgical History:  Procedure Laterality Date  . COLON SURGERY     polyps   . COLONOSCOPY    . EYE SURGERY Bilateral    cataracts   . left knee surgery- microscopic  Left 80's   [facility]   . 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 TREATMENT FISTULA ANAL      Current Outpatient Medications  Medication Sig Dispense Refill  . alfuzosin (UROXATRAL) 10 MG 24 hr tablet     . amLODipine (NORVASC) 2.5 MG tablet  Take 1 tablet (2.5 mg total) by mouth daily. 90 tablet 3  . aspirin 81 MG tablet Take 81 mg by mouth daily.    . cholecalciferol (VITAMIN D) 1000 UNITS tablet Take 2,000 Units by mouth daily.     . fluticasone (FLONASE) 50 MCG/ACT nasal spray Place 1 spray into both nostrils daily.    . furosemide (LASIX) 40 MG tablet Take 1 tablet (40 mg total) by mouth daily for 3 days. 3 tablet 0  . ibuprofen (ADVIL) 600 MG tablet TAKE 1 TABLET (600 MG TOTAL) BY MOUTH EVERY 8 (EIGHT) HOURS AS NEEDED FOR MODERATE PAIN. 30 tablet 1  . meclizine (ANTIVERT) 12.5 MG tablet Take one (1) tablet (12.5 mg) by mouth four (4) times daily with meals and at bedtime for dizziness.    . Multiple Vitamin (MULTIVITAMIN WITH MINERALS) TABS tablet Take 1 tablet by mouth daily.    . predniSONE (STERAPRED UNI-PAK 21 TAB) 10 MG (21) TBPK tablet As directed x 6 days. Start 8/18. 21 tablet 0  . rosuvastatin (CRESTOR) 10 MG tablet TAKE 1 TABLET BY MOUTH 2 TIMES A WEEK 12 tablet 1  . tadalafil (CIALIS) 10 MG tablet Take 1 tablet (10 mg total) by mouth daily as needed for erectile dysfunction. 10 tablet 12  . TRAVATAN Z 0.004 % SOLN ophthalmic solution Place 1 drop into both eyes at bedtime.   11   Current Facility-Administered Medications  Medication Dose Route Frequency Provider Last Rate Last Admin  . 0.9 %  sodium chloride infusion  500 mL Intravenous Once Pyrtle, [name], MD        Allergies:   Sulfa antibiotics, Gabapentin, and Vesicare [solifenacin]   Social History:  The patient  reports that he has never smoked. He has never used smokeless tobacco. He reports current alcohol use. He reports that he does not use drugs.   Family History:  The patient's family history includes Cancer in his sister; Diabetes in his mother; Emphysema in his brother, father, and sister; Glaucoma in his father; Heart disease in his brother, father, mother, sister, and sister; Hyperlipidemia in his sister and son; Hypertension in his sister; Lumbar  disc disease in his sister; Other in his mother; Polycythemia in his son.  ROS:  Please see the history of present illness.    All other systems are reviewed and otherwise negative.   PHYSICAL EXAM:  VS:  There were no vitals taken for this visit. BMI: There is no height or weight on file to calculate BMI. Well nourished, well developed, in no acute distress HEENT: normocephalic, atraumatic Neck: no JVD, carotid bruits or masses Cardiac:  Regularly irregular; no significant murmurs are appreciated today, no rubs, or gallops Lungs:  CTA b/l, no wheezing, rhonchi or rales Abd: soft, nontender MS: no deformity or atrophy Ext: he has 1+ edema  Skin: warm and dry, no rash Neuro:  No gross deficits appreciated Psych: euthymic mood, full affect     EKG:  Done today and reviewed by myself shows  SR, 1st degree AVblock, PR 265ms, V.bigeminy, 68bpm  OLD 01/02/20, SR 1st degree AVBlock, PACs 05/21/2018: SB 55bpm, 1st degree AVblock 04/02/2018: SB 45bpm, 1st degree AVBlock   June 2019: 48 holter 1. NSR with sinus tachy and sinus bradycardia 2. First degree AV block 3. Frequent PVC"s and PAC's 4. NS AT and VT 5. No sustained SVT or VT 6. Noise artifact is present 7. No significant pauses  PVC burden was 14% (noting bigeminy, trigeminy and couplets, as well as NSVT, longest 4 beats)    05/25/2018: TTE Study Conclusions  - Left ventricle: The cavity size was normal. Systolic function was  normal. The estimated ejection fraction was in the range of 55%  to 60%. Wall motion was normal; there were no regional wall  motion abnormalities. Doppler parameters are consistent with  abnormal left ventricular relaxation (grade 1 diastolic  dysfunction).  - Aortic valve: Transvalvular velocity was within the normal range.  There was no stenosis. There was mild regurgitation. Valve area  (VTI): 3.18 cm^2. Valve area (Vmax): 3.27 cm^2. Valve area  (Vmean): 3.7 cm^2.  - Aorta:  Ascending aortic diameter: 39 mm (S).  - Ascending aorta: The ascending aorta was mildly dilated.  - Mitral valve: Transvalvular velocity was within the normal range.  There was no evidence for stenosis. There was trivial  regurgitation.  - Left atrium: The atrium was mildly dilated.  - Right ventricle: The cavity size was normal. Wall thickness was  normal. Systolic function was normal.  - Atrial septum: No defect or patent foramen ovale was identified  by color flow Doppler.  - Tricuspid valve: There was mild regurgitation.  - Pulmonary arteries: Systolic pressure was within the normal  range. PA peak pressure: 25 mm Hg (S).   Recent Labs: 10/05/2020: ALT 29; BUN 16; Creatinine, Ser 1.14; Hemoglobin 12.6; Magnesium 2.2; Platelets 159; Potassium 4.5; Sodium 138; TSH 2.810  05/13/2020: Chol/HDL Ratio 2.2; Cholesterol, Total 155; HDL 69; LDL Chol Calc (NIH) 75; Triglycerides 55   CrCl cannot be calculated (Unknown ideal weight.).   Wt Readings from Last 3 Encounters:  07/21/20 180 lb 9.6 oz (81.9 kg)  05/13/20 182 lb (82.6 kg)  01/15/20 183 lb (83 kg)     Other studies reviewed: Additional studies/records reviewed today include: summarized above  ASSESSMENT AND PLAN:  1. Sinus node dysfunction     No symptoms of bradycardia     Avoid nodal blocking BP agents  2. PVCs     Known for him and are causing false readings of HR 30's     He is unaware of them  He has baseline bradycardia with sinus node dysfunction and 1st degree AVblock Update his echo and have home wear a 48-72hour monitor For now, will not treat PVCs unless his EF is down or burden is significant   3. Edema     Described as marked at one oint, though ? Seems onset associated with what seems a drug rash     rash is resolved, he has ome mild erythema, no wounds, skin is not hot     He has trace-1+ edema R>L     Will continue low dose lasix   4.  Murmur     No significant murmur is appreciated  today   5. HTN     No changes today    Disposition: F/u with Dr Lovena Le in 3 weeks or so   Current medicines are reviewed at length with the patient today.  The patient did not have any concerns regarding medicines.  Venetia Night, PA-C 10/08/2020 6:32 PM     [address] [place] [address] [phone] (office)  [phone] (fax)

## 2020-10-09 ENCOUNTER — Ambulatory Visit (HOSPITAL_COMMUNITY): Payer: Medicare PPO | Attending: Cardiology

## 2020-10-09 ENCOUNTER — Ambulatory Visit: Payer: Medicare PPO | Admitting: Physician Assistant

## 2020-10-09 ENCOUNTER — Telehealth: Payer: Self-pay | Admitting: Radiology

## 2020-10-09 ENCOUNTER — Encounter: Payer: Self-pay | Admitting: Physician Assistant

## 2020-10-09 ENCOUNTER — Other Ambulatory Visit: Payer: Self-pay

## 2020-10-09 VITALS — BP 144/68 | HR 68 | Ht 70.0 in | Wt 184.4 lb

## 2020-10-09 DIAGNOSIS — I10 Essential (primary) hypertension: Secondary | ICD-10-CM | POA: Diagnosis not present

## 2020-10-09 DIAGNOSIS — I351 Nonrheumatic aortic (valve) insufficiency: Secondary | ICD-10-CM | POA: Insufficient documentation

## 2020-10-09 DIAGNOSIS — E785 Hyperlipidemia, unspecified: Secondary | ICD-10-CM | POA: Diagnosis not present

## 2020-10-09 DIAGNOSIS — I493 Ventricular premature depolarization: Secondary | ICD-10-CM | POA: Insufficient documentation

## 2020-10-09 DIAGNOSIS — R002 Palpitations: Secondary | ICD-10-CM | POA: Insufficient documentation

## 2020-10-09 DIAGNOSIS — I495 Sick sinus syndrome: Secondary | ICD-10-CM | POA: Diagnosis not present

## 2020-10-09 DIAGNOSIS — R609 Edema, unspecified: Secondary | ICD-10-CM

## 2020-10-09 LAB — ECHOCARDIOGRAM COMPLETE
AR max vel: 2.99 cm2
AV Area VTI: 2.75 cm2
AV Area mean vel: 3.27 cm2
AV Mean grad: 5.7 mmHg
AV Peak grad: 10.3 mmHg
Ao pk vel: 1.6 m/s
Area-P 1/2: 3.53 cm2
Height: 70 in
P 1/2 time: 364 msec
S' Lateral: 3.1 cm
Weight: 2950.4 oz

## 2020-10-09 MED ORDER — FUROSEMIDE 20 MG PO TABS: 20.0000 mg | ORAL_TABLET | Freq: Every day | ORAL | 3 refills | Status: DC

## 2020-10-09 NOTE — Telephone Encounter (Signed)
Enrolled patient for a 3 day Zio XT monitor to be mailed to patients home  

## 2020-10-09 NOTE — Patient Instructions (Addendum)
Medication Instructions:   START TAKING  FUROSEMIDE ( LASIX)  20 MG ONCE A DAY   *If you need a refill on your cardiac medications before your next appointment, please call your pharmacy*   Lab Work: NONE ORDERED  TODAY   If you have labs (blood work) drawn today and your tests are completely normal, you will receive your results only by: Marland Kitchen MyChart Message (if you have MyChart) OR . A paper copy in the mail If you have any lab test that is abnormal or we need to change your treatment, we will call you to review the results.   Testing/Procedures: Your physician has recommended that you wear a holter monitor. Holter monitors are medical devices that record the heart's electrical activity. Doctors most often use these monitors to diagnose arrhythmias. Arrhythmias are problems with the speed or rhythm of the heartbeat. The monitor is a small, portable device. You can wear one while you do your normal daily activities. This is usually used to diagnose what is causing palpitations/syncope (passing out).   Your physician has requested that you have an echocardiogram. Echocardiography is a painless test that uses sound waves to create images of your heart. It provides your doctor with information about the size and shape of your heart and how well your heart's chambers and valves are working. This procedure takes approximately one hour. There are no restrictions for this procedure.  Follow-Up: At [facility], you and your health needs are our priority.  As part of our continuing mission to provide you with exceptional heart care, we have created designated Provider Care Teams.  These Care Teams include your primary Cardiologist (physician) and Advanced Practice Providers (APPs -  Physician Assistants and Nurse Practitioners) who all work together to provide you with the care you need, when you need it.  We recommend signing up for the patient portal called "MyChart".  Sign up information is  provided on this After Visit Summary.  MyChart is used to connect with patients for Virtual Visits (Telemedicine).  Patients are able to view lab/test results, encounter notes, upcoming appointments, etc.  Non-urgent messages can be sent to your provider as well.   To learn more about what you can do with MyChart, go to [email or web address].    Your next appointment:   3 week(s)  The format for your next appointment:   In Person  Provider:   [name], MD   Other Instructions

## 2020-10-14 DIAGNOSIS — C4441 Basal cell carcinoma of skin of scalp and neck: Secondary | ICD-10-CM | POA: Diagnosis not present

## 2020-10-14 DIAGNOSIS — C44719 Basal cell carcinoma of skin of left lower limb, including hip: Secondary | ICD-10-CM | POA: Diagnosis not present

## 2020-10-14 DIAGNOSIS — Z483 Aftercare following surgery for neoplasm: Secondary | ICD-10-CM | POA: Diagnosis not present

## 2020-10-26 DIAGNOSIS — M48062 Spinal stenosis, lumbar region with neurogenic claudication: Secondary | ICD-10-CM | POA: Diagnosis not present

## 2020-10-26 DIAGNOSIS — I493 Ventricular premature depolarization: Secondary | ICD-10-CM | POA: Diagnosis not present

## 2020-10-26 NOTE — Progress Notes (Signed)
PCP:  [name], DO Primary Cardiologist: No primary care provider on file. Electrophysiologist: [name], MD   Paul Parks is a 84 y.o. male seen today for [name], MD for routine electrophysiology followup s/p Echo and monitor..  Since last being seen in our clinic the patient reports doing very well. His only issues are with his back and R leg. He has an epidural shot planned, if no relief will be considered for surgery.  he denies chest pain, palpitations, dyspnea, PND, orthopnea, nausea, vomiting, dizziness, syncope, edema, weight gain, or early satiety.  Zio patch reviewed Shows occasional NSVT (Normal EF by recent Echo) Shows intermittent CHB and Mobitz I, mostly associated nocturnal hours, some borderline (I.e 10 pm where patient not entirely sure if he was sleeping or not.) Asymptomatic for all.  Lowest HR 34 bpm (0348 am) Fastest "214" bpm in the setting of a 4 beat run of NSVT.  Past Medical History:  Diagnosis Date  . Allergic rhinitis   . Allergy   . Aortic atherosclerosis ([place])   . Bladder disorder    takes bladder control meds  . BPH (benign prostatic hyperplasia)   . Cataract   . DDD (degenerative disc disease)   . Diverticulosis   . Glaucoma   . Hyperlipemia   . Hyperlipidemia   . Labyrinthitis   . Personal history of colonic polyps   . Skull fracture ([place]) 1955   from [place]   . Thrombocytopenia ([place])   . Tubular adenoma of colon   . Vitamin D deficiency    Past Surgical History:  Procedure Laterality Date  . COLON SURGERY     polyps   . COLONOSCOPY    . EYE SURGERY Bilateral    cataracts   . left knee surgery- microscopic  Left 80's   [facility]   . 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 TREATMENT FISTULA ANAL      Current Outpatient Medications  Medication Sig Dispense Refill  . alfuzosin (UROXATRAL) 10 MG 24 hr tablet Take 10 mg by mouth daily.     Marland Kitchen amLODipine (NORVASC) 2.5 MG tablet Take 1  tablet (2.5 mg total) by mouth daily. 90 tablet 3  . aspirin 81 MG tablet Take 81 mg by mouth daily.    . cholecalciferol (VITAMIN D) 1000 UNITS tablet Take 2,000 Units by mouth daily.     . fluticasone (FLONASE) 50 MCG/ACT nasal spray Place 1 spray into both nostrils daily.    . furosemide (LASIX) 20 MG tablet Take 1 tablet (20 mg total) by mouth daily. 90 tablet 3  . ibuprofen (ADVIL) 600 MG tablet TAKE 1 TABLET (600 MG TOTAL) BY MOUTH EVERY 8 (EIGHT) HOURS AS NEEDED FOR MODERATE PAIN. 30 tablet 1  . meclizine (ANTIVERT) 12.5 MG tablet Take one (1) tablet (12.5 mg) by mouth four (4) times daily with meals and at bedtime for dizziness.    . Multiple Vitamin (MULTIVITAMIN WITH MINERALS) TABS tablet Take 1 tablet by mouth daily.    . rosuvastatin (CRESTOR) 10 MG tablet TAKE 1 TABLET BY MOUTH 2 TIMES A WEEK 12 tablet 1  . tadalafil (CIALIS) 10 MG tablet Take 1 tablet (10 mg total) by mouth daily as needed for erectile dysfunction. 10 tablet 12  . TRAVATAN Z 0.004 % SOLN ophthalmic solution Place 1 drop into both eyes at bedtime.   11   Current Facility-Administered Medications  Medication Dose Route Frequency  Provider Last Rate Last Admin  . 0.9 %  sodium chloride infusion  500 mL Intravenous Once Pyrtle, [name], MD        Allergies  Allergen Reactions  . Sulfa Antibiotics Itching    Rash   . Gabapentin   . Vesicare [Solifenacin] Other (See Comments)    HA     Social History   Socioeconomic History  . Marital status: Married    Spouse name: [name]  . Number of children: 2  . Years of education: Not on file  . Highest education level: Not on file  Occupational History  . 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 Smoking status: Never Smoker  . Smokeless tobacco: Never Used  Vaping Use  . Vaping Use: Never used  Substance and Sexual Activity  . Alcohol use: Yes    Alcohol/week: 0.0 standard drinks    Comment: every 2  months 1-2 beers                            . Drug use: No  . Sexual activity: Yes  Other Topics Concern  . Not on file  Social History Narrative   Lives at home with wife, [name]    Social Determinants of Health   Financial Resource Strain:   . Difficulty of Paying Living Expenses: Not on file  Food Insecurity:   . Worried About Charity fundraiser in the Last Year: Not on file  . Ran Out of Food in the Last Year: Not on file  Transportation Needs:   . Lack of Transportation (Medical): Not on file  . Lack of Transportation (Non-Medical): Not on file  Physical Activity:   . Days of Exercise per Week: Not on file  . Minutes of Exercise per Session: Not on file  Stress:   . Feeling of Stress : Not on file  Social Connections:   . Frequency of Communication with Friends and Family: Not on file  . Frequency of Social Gatherings with Friends and Family: Not on file  . Attends Religious Services: Not on file  . Active Member of Clubs or Organizations: Not on file  . Attends Archivist Meetings: Not on file  . Marital Status: Not on file  Intimate Partner Violence:   . Fear of Current or Ex-Partner: Not on file  . Emotionally Abused: Not on file  . Physically Abused: Not on file  . Sexually Abused: Not on file     Review of Systems: General: No chills, fever, night sweats or weight changes  Cardiovascular:  No chest pain, dyspnea on exertion, edema, orthopnea, palpitations, paroxysmal nocturnal dyspnea Dermatological: No rash, lesions or masses Respiratory: No cough, dyspnea Urologic: No hematuria, dysuria Abdominal: No nausea, vomiting, diarrhea, bright red blood per rectum, melena, or hematemesis Neurologic: No visual changes, weakness, changes in mental status All other systems reviewed and are otherwise negative except as noted above.  Physical Exam: There were no vitals filed for this visit.  GEN- The patient is well appearing, alert and oriented x 3 today.    HEENT: normocephalic, atraumatic; sclera clear, conjunctiva pink; hearing intact; oropharynx clear; neck supple, no JVP Lymph- no cervical lymphadenopathy Lungs- Clear to ausculation bilaterally, normal work of breathing.  No wheezes, rales, rhonchi Heart- Regular rate and rhythm, no murmurs, rubs or gallops, PMI not laterally displaced GI- soft, non-tender,  non-distended, bowel sounds present, no hepatosplenomegaly Extremities- no clubbing, cyanosis, or edema; DP/PT/radial pulses 2+ bilaterally MS- no significant deformity or atrophy Skin- warm and dry, no rash or lesion Psych- euthymic mood, full affect Neuro- strength and sensation are intact  EKG is not ordered.  Additional studies reviewed include: Previous EP office notes Zio patch as above.   Assessment and Plan:  1. Sinus node dysfunction No symptoms of bradycardia Avoid nodal blocking BP agents Zio patch with 1st degree AV block, Second Degree AV block, Mobitz I/Wenckebach, and intermittent CHB, mostly nocturnal. No symptoms.   2. PVCs Known for him and are causing false readings of HR 30's Occasional NSVT noted on Zio. Echo with normal EF.   3. Edema Stable today.  Drug rash resolved.  Continue low dose lasix  4. HTN No changes today  Reviewed with Dr. Lovena Le. Continue watchful waiting. RTC 6 months. Sooner with symptoms.  Pt knows to let us know of any lightheadedness, dizziness, SOB, worsening fatigue, or near syncope. Pt should seek immediate care for any frank syncope. If he is considered for back surgery, may need to consider PPM sooner if considering general anaesthesia.   Shirley Friar, PA-C  10/26/20 2:55 PM

## 2020-10-27 ENCOUNTER — Other Ambulatory Visit: Payer: Self-pay

## 2020-10-27 ENCOUNTER — Ambulatory Visit: Payer: Medicare PPO | Admitting: Student

## 2020-10-27 ENCOUNTER — Telehealth: Payer: Self-pay | Admitting: Internal Medicine

## 2020-10-27 VITALS — BP 142/54 | HR 58 | Ht 70.0 in | Wt 189.0 lb

## 2020-10-27 DIAGNOSIS — I441 Atrioventricular block, second degree: Secondary | ICD-10-CM | POA: Diagnosis not present

## 2020-10-27 DIAGNOSIS — I10 Essential (primary) hypertension: Secondary | ICD-10-CM

## 2020-10-27 DIAGNOSIS — I493 Ventricular premature depolarization: Secondary | ICD-10-CM | POA: Diagnosis not present

## 2020-10-27 DIAGNOSIS — I495 Sick sinus syndrome: Secondary | ICD-10-CM

## 2020-10-27 NOTE — Patient Instructions (Signed)
Medication Instructions:  *If you need a refill on your cardiac medications before your next appointment, please call your pharmacy*  Follow-Up: At [facility], you and your health needs are our priority.  As part of our continuing mission to provide you with exceptional heart care, we have created designated Provider Care Teams.  These Care Teams include your primary Cardiologist (physician) and Advanced Practice Providers (APPs -  Physician Assistants and Nurse Practitioners) who all work together to provide you with the care you need, when you need it.  We recommend signing up for the patient portal called "MyChart".  Sign up information is provided on this After Visit Summary.  MyChart is used to connect with patients for Virtual Visits (Telemedicine).  Patients are able to view lab/test results, encounter notes, upcoming appointments, etc.  Non-urgent messages can be sent to your provider as well.   To learn more about what you can do with MyChart, go to [email or web address].    Your next appointment:   Your physician recommends that you schedule a follow-up appointment in: 6 MONTHS with Dr. Lovena Le

## 2020-10-27 NOTE — Telephone Encounter (Signed)
Spoke with Raquel Sarna with [facility] and she reported the pt had a Zio from 11/12-11/15/21 and he had 4 episodes of CHB and 3 runs of SVT. I spoke with A Tillery PA and he reviewed and saw the pt today in the office.

## 2020-10-27 NOTE — Telephone Encounter (Signed)
New Message  irthym is calling with abnormal cardiac results from [facility] monitor

## 2020-11-06 ENCOUNTER — Telehealth: Payer: Self-pay | Admitting: Internal Medicine

## 2020-11-06 NOTE — Telephone Encounter (Signed)
-----   Message from Evans Lance, MD sent at 11/01/2020  8:45 PM EST ----- Low risk monitor. Followup in 3-4 months

## 2020-11-06 NOTE — Telephone Encounter (Signed)
    Pt is returning call to get heart monitor result 

## 2020-11-06 NOTE — Telephone Encounter (Signed)
The patient has been notified of the result and verbalized understanding.  All questions (if any) were answered. Darrell Jewel, RN 11/06/2020 11:32 AM   Made follow up appointment. Patient aware.

## 2020-11-12 ENCOUNTER — Ambulatory Visit: Payer: Medicare PPO | Admitting: Family Medicine

## 2020-11-12 ENCOUNTER — Other Ambulatory Visit: Payer: Self-pay

## 2020-11-12 ENCOUNTER — Encounter: Payer: Self-pay | Admitting: Family Medicine

## 2020-11-12 VITALS — BP 103/56 | HR 60 | Temp 98.0°F | Ht 70.0 in | Wt 185.0 lb

## 2020-11-12 DIAGNOSIS — D696 Thrombocytopenia, unspecified: Secondary | ICD-10-CM

## 2020-11-12 DIAGNOSIS — I10 Essential (primary) hypertension: Secondary | ICD-10-CM

## 2020-11-12 DIAGNOSIS — R1032 Left lower quadrant pain: Secondary | ICD-10-CM | POA: Diagnosis not present

## 2020-11-12 DIAGNOSIS — R0989 Other specified symptoms and signs involving the circulatory and respiratory systems: Secondary | ICD-10-CM | POA: Diagnosis not present

## 2020-11-12 DIAGNOSIS — I700 Atherosclerosis of aorta: Secondary | ICD-10-CM

## 2020-11-12 DIAGNOSIS — R600 Localized edema: Secondary | ICD-10-CM | POA: Diagnosis not present

## 2020-11-12 DIAGNOSIS — I493 Ventricular premature depolarization: Secondary | ICD-10-CM

## 2020-11-12 LAB — CBC
Hematocrit: 39.6 % (ref 37.5–51.0)
Hemoglobin: 13.3 g/dL (ref 13.0–17.7)
MCH: 30.9 pg (ref 26.6–33.0)
MCHC: 33.6 g/dL (ref 31.5–35.7)
MCV: 92 fL (ref 79–97)
Platelets: 146 10*3/uL — ABNORMAL LOW (ref 150–450)
RBC: 4.31 x10E6/uL (ref 4.14–5.80)
RDW: 12 % (ref 11.6–15.4)
WBC: 5.7 10*3/uL (ref 3.4–10.8)

## 2020-11-12 NOTE — Patient Instructions (Signed)

## 2020-11-12 NOTE — [facility] Notes (Signed)
Subjective: CC: Follow-up edema, back pain PCP: [name], DO OAC:ZYSAY Paul Parks is a 84 y.o. male presenting to clinic today for:  1.  Leg edema/MSK concerns Patient with ongoing leg edema despite use of Lasix 20 mg daily.  He does feel that this is helped some but is not totally eradicated his edema.  Not currently using any compression hose but would consider use of them.  The edema was thought to be triggered by use of gabapentin and NSAID, both of which she has discontinued for quite some time now.  He has ongoing right-sided low back pain.  His next shot will be scheduled for December 16 with Dr. [name] at the L4 level this time.  He notes that he is also been having some left inguinal pain that is intermittent and exacerbated by certain activities.  2.  PVCs Patient had evaluation by cardiology and bradycardia was really thought to be a result of PVC confounding the heart rate.  There were no planned interventions at this time but rather watchful waiting with reassessment planned for March.  He denies any symptomology including dizziness, presyncopal episodes.  He has had edema as below but again echocardiogram was noted to be fairly normal with only mild regurgitation noted of a few of the valves.  ROS: Per HPI  Allergies  Allergen Reactions  . Sulfa Antibiotics Itching    Rash   . Gabapentin   . Vesicare [Solifenacin] Other (See Comments)    HA    Past Medical History:  Diagnosis Date  . Allergic rhinitis   . Allergy   . Aortic atherosclerosis ([place])   . Bladder disorder    takes bladder control meds  . BPH (benign prostatic hyperplasia)   . Cataract   . DDD (degenerative disc disease)   . Diverticulosis   . Glaucoma   . Hyperlipemia   . Hyperlipidemia   . Labyrinthitis   . Personal history of colonic polyps   . Skull fracture ([place]) 1955   from [place]   . Thrombocytopenia ([place])   . Tubular adenoma of colon   . Vitamin D deficiency     Current Outpatient  Medications:  .  alfuzosin (UROXATRAL) 10 MG 24 hr tablet, Take 10 mg by mouth daily. , Disp: , Rfl:  .  amLODipine (NORVASC) 2.5 MG tablet, Take 1 tablet (2.5 mg total) by mouth daily., Disp: 90 tablet, Rfl: 3 .  aspirin 81 MG tablet, Take 81 mg by mouth daily., Disp: , Rfl:  .  cholecalciferol (VITAMIN D) 1000 UNITS tablet, Take 2,000 Units by mouth daily. , Disp: , Rfl:  .  fluticasone (FLONASE) 50 MCG/ACT nasal spray, Place 1 spray into both nostrils daily., Disp: , Rfl:  .  furosemide (LASIX) 20 MG tablet, Take 1 tablet (20 mg total) by mouth daily., Disp: 90 tablet, Rfl: 3 .  ibuprofen (ADVIL) 600 MG tablet, TAKE 1 TABLET (600 MG TOTAL) BY MOUTH EVERY 8 (EIGHT) HOURS AS NEEDED FOR MODERATE PAIN., Disp: 30 tablet, Rfl: 1 .  meclizine (ANTIVERT) 12.5 MG tablet, as needed. Take one (1) tablet (12.5 mg) by mouth four (4) times daily with meals and at bedtime for dizziness. , Disp: , Rfl:  .  Multiple Vitamin (MULTIVITAMIN WITH MINERALS) TABS tablet, Take 1 tablet by mouth daily., Disp: , Rfl:  .  rosuvastatin (CRESTOR) 10 MG tablet, TAKE 1 TABLET BY MOUTH 2 TIMES A WEEK, Disp: 12 tablet, Rfl: 1 .  tadalafil (CIALIS) 10 MG tablet, Take  1 tablet (10 mg total) by mouth daily as needed for erectile dysfunction., Disp: 10 tablet, Rfl: 12 .  TRAVATAN Z 0.004 % SOLN ophthalmic solution, Place 1 drop into both eyes at bedtime. , Disp: , Rfl: 11  Current Facility-Administered Medications:  .  0.9 %  sodium chloride infusion, 500 mL, Intravenous, Once, Pyrtle, [name], MD Social History   Socioeconomic History  . Marital status: Married    Spouse name: [name]  . Number of children: 2  . Years of education: Not on file  . Highest education level: Not on file  Occupational History  . 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 Smoking status: Never Smoker  . Smokeless tobacco: Never Used  Vaping Use  . Vaping Use: Never used  Substance and  Sexual Activity  . Alcohol use: Yes    Alcohol/week: 0.0 standard drinks    Comment: every 2 months 1-2 beers                            . Drug use: No  . Sexual activity: Yes  Other Topics Concern  . Not on file  Social History Narrative   Lives at home with wife, [name]    Social Determinants of Health   Financial Resource Strain: Not on file  Food Insecurity: Not on file  Transportation Needs: Not on file  Physical Activity: Not on file  Stress: Not on file  Social Connections: Not on file  Intimate Partner Violence: Not on file   Family History  Problem Relation Age of Onset  . Heart disease Mother   . Diabetes Mother        diet controlled  . Other Mother        tetanus / lock jaw  . Heart disease Brother   . Emphysema Brother        smoker  . Heart disease Father   . Emphysema Father        smoker  . Glaucoma Father   . Cancer Sister        brain and stomach / colon   . Emphysema Sister        smoker  . Lumbar disc disease Sister   . Hyperlipidemia Sister   . Hypertension Sister   . Heart disease Sister   . Polycythemia Son   . Heart disease Sister   . Hyperlipidemia Son   . Colon cancer Neg Hx   . Esophageal cancer Neg Hx   . Rectal cancer Neg Hx   . Stomach cancer Neg Hx     Objective: Office vital signs reviewed. BP (!) 103/56   Pulse 60   Temp 98 F (36.7 C)   Ht 5\' 10"  (1.778 m)   Wt 185 lb (83.9 kg)   SpO2 97%   BMI 26.54 kg/m   Physical Examination:  General: Awake, alert, well nourished, No acute distress HEENT: Normal; sclera white.  Moist mucous membranes; left carotid bruit noted Cardio: regular rate and rhythm, S1S2 heard, soft systolic murmurs appreciated Pulm: clear to auscultation bilaterally, no wheezes, rhonchi or rales; normal work of breathing on room air GI: soft, non-tender, non-distended, bowel sounds present x4, no hepatomegaly, no splenomegaly, no masses; diastases recti noted Extremities: warm, well perfused, 1+  pitting edema to mid shins bilaterally, No cyanosis or clubbing; +1 pulses bilaterally MSK: slow gait but ambulates  independently  Assessment/ Plan: 84 y.o. male   PVC (premature ventricular contraction)  Essential hypertension  Aortic atherosclerosis (HCC)  Left inguinal pain  Thrombocytopenia (HCC) - Plan: Vitamin B12, CBC  Bilateral leg edema - Plan: Compression stockings  Bruit of left carotid artery  I reviewed his cardiology note and his intermittent bradycardia observed in office is actually due to PVCs.  His blood pressure is well controlled.  Continue current regimen for now.  I am ordering compression hose to be sent to [facility] given ongoing leg edema.  Would advise against increasing Lasix frequency given borderline blood pressure  Left inguinal pain may be an iliopsoas pain, particularly given change in gait due to underlying back pain.  Hopefully after his next shot next week at the L4 level he will start experiencing improvement in this but in the meantime I have given him home physical therapy in efforts to improve it.   No orders of the defined types were placed in this encounter.  No orders of the defined types were placed in this encounter.    [name], DO [place] [phone]

## 2020-11-13 LAB — VITAMIN B12: Vitamin B-12: 451 pg/mL (ref 232–1245)

## 2020-11-19 DIAGNOSIS — M5416 Radiculopathy, lumbar region: Secondary | ICD-10-CM | POA: Diagnosis not present

## 2020-12-08 ENCOUNTER — Ambulatory Visit (INDEPENDENT_AMBULATORY_CARE_PROVIDER_SITE_OTHER): Payer: Medicare PPO | Admitting: *Deleted

## 2020-12-08 DIAGNOSIS — Z0000 Encounter for general adult medical examination without abnormal findings: Secondary | ICD-10-CM

## 2020-12-08 NOTE — Patient Instructions (Signed)
  MEDICARE ANNUAL WELLNESS VISIT Health Maintenance Summary and Written Plan of Care  Mr. Rushing ,  Thank you for allowing me to perform your Medicare Annual Wellness Visit and for your ongoing commitment to your health.   Health Maintenance & Immunization History Health Maintenance  Topic Date Due  . COVID-19 Vaccine (4 - Booster for Moderna series) 04/20/2021  . COLONOSCOPY (Pts 45-20yrs Insurance coverage will need to be confirmed)  11/11/2022  . TETANUS/TDAP  01/14/2030  . INFLUENZA VACCINE  Completed  . PNA vac Low Risk Adult  Completed   Immunization History  Administered Date(s) Administered  . Fluad Quad(high Dose 65+) 10/06/2020  . Influenza, High Dose Seasonal PF 10/13/2015, 09/22/2016, 10/05/2017, 10/04/2018  . Influenza,inj,Quad PF,6+ Mos 10/08/2013, 10/21/2014  . Moderna Sars-Covid-2 Vaccination 01/01/2020, 01/29/2020, 10/21/2020  . Pneumococcal Conjugate-13 02/12/2014  . Pneumococcal Polysaccharide-23 03/19/2009  . Td 01/15/2020  . Tdap 11/08/2011  . Zoster 01/22/2007    These are the patient goals that we discussed: Goals Addressed            This Visit's Progress   . AWV       12/08/2020 AWV Goal: Fall Prevention  . Over the next year, patient will decrease their risk for falls by: o Using assistive devices, such as a cane or walker, as needed o Identifying fall risks within their home and correcting them by: - Removing throw rugs - Adding handrails to stairs or ramps - Removing clutter and keeping a clear pathway throughout the home - Increasing light, especially at night - Adding shower handles/bars - Raising toilet seat o Identifying potential personal risk factors for falls: - Medication side effects - Incontinence/urgency - Vestibular dysfunction - Hearing loss - Musculoskeletal disorders - Neurological disorders - Orthostatic hypotension          This is a list of Health Maintenance Items that are overdue or due now: There are no  preventive care reminders to display for this patient.   Orders/Referrals Placed Today: No orders of the defined types were placed in this encounter.  (Contact our referral department at [phone] if you have not spoken with someone about your referral appointment within the next 5 days)    Follow-up Plan . Follow-up with [name], DO as planned . Keep upcoming appointment with Neurosurgery

## 2020-12-08 NOTE — Progress Notes (Signed)
MEDICARE ANNUAL WELLNESS VISIT  12/08/2020  Telephone Visit Disclaimer This Medicare AWV was conducted by telephone due to national recommendations for restrictions regarding the COVID-19 Pandemic (e.g. social distancing).  I verified, using two identifiers, that I am speaking with Paul Parks or their authorized healthcare agent. I discussed the limitations, risks, security, and privacy concerns of performing an evaluation and management service by telephone and the potential availability of an in-person appointment in the future. The patient expressed understanding and agreed to proceed.  Location of Patient: Home Location of Provider (nurse):  [facility]  Subjective:    Paul Parks is a 85 y.o. male patient of Paul Norlander, DO who had a Medicare Annual Wellness Visit today via telephone. Paul Parks is Retired and lives with his wife Paul Parks. He has 2 children. He reports that he is socially active and does interact with friends/family regularly. He is moderately physically active and enjoys yard work, spending time with family, golfing, and his music collection.  Patient Care Team: Paul Norlander, DO as PCP - General (Family Medicine) Paul Lance, MD as PCP - Electrophysiology (Cardiology) Bond, Paul Harrier, MD as Referring Physician (Ophthalmology) Paul Seal, MD as Attending Physician (Urology) Paul Red Oswaldo Conroy, MD as Consulting Physician (Dermatology) Paul Doheny, MD as Consulting Physician (Plastic Surgery) Paul Parks, Paul Lines, MD as Consulting Physician (Gastroenterology)  Advanced Directives 12/08/2020 07/24/2020 04/02/2018 06/23/2016 02/18/2015  Does Patient Have a Medical Advance Directive? Yes No No No No  Type of Paramedic of [place];Living will - - - -  Would patient like information on creating a medical advance directive? - - Yes (MAU/Ambulatory/Procedural Areas - Information given) Yes - Educational  materials given Yes - Educational materials given    Hospital Utilization Over the Past 12 Months: # of hospitalizations or ER visits: 0 # of surgeries: 0  Review of Systems    Patient reports that his overall health is worse compared to last year. Due to recent back pain   History obtained from chart review and the patient Musculoskeletal ROS: positive for - pain in back - bilateral, lower  Patient Reported Readings (BP, Pulse, CBG, Weight, etc) none  Pain Assessment Pain : 0-10 Pain Score: 2  Pain Type: Chronic pain Pain Location: Back Pain Orientation: Right,Lower Pain Descriptors / Indicators: Burning Pain Onset: More than a month ago Pain Frequency: Constant Pain Relieving Factors: Exercise  Pain Relieving Factors: Exercise  Current Medications & Allergies (verified) Allergies as of 12/08/2020      Reactions   Sulfa Antibiotics Itching   Rash    Ibuprofen Swelling   Gabapentin    Vesicare [solifenacin] Other (See Comments)   HA       Medication List       Accurate as of December 08, 2020  9:43 AM. If you have any questions, ask your nurse or doctor.        STOP taking these medications   ibuprofen 600 MG tablet Commonly known as: ADVIL     TAKE these medications   alfuzosin 10 MG 24 hr tablet Commonly known as: UROXATRAL Take 10 mg by mouth daily.   amLODipine 2.5 MG tablet Commonly known as: NORVASC Take 1 tablet (2.5 mg total) by mouth daily.   aspirin 81 MG tablet Take 81 mg by mouth daily.   cholecalciferol 1000 units tablet Commonly known as: VITAMIN D Take 2,000 Units by mouth daily.   fluticasone 50 MCG/ACT nasal spray Commonly  known as: FLONASE Place 1 spray into both nostrils daily.   furosemide 20 MG tablet Commonly known as: LASIX Take 1 tablet (20 mg total) by mouth daily.   meclizine 12.5 MG tablet Commonly known as: ANTIVERT as needed. Take one (1) tablet (12.5 mg) by mouth four (4) times daily with meals and at bedtime for  dizziness.   multivitamin with minerals Tabs tablet Take 1 tablet by mouth daily.   rosuvastatin 10 MG tablet Commonly known as: CRESTOR TAKE 1 TABLET BY MOUTH 2 TIMES A WEEK   tadalafil 10 MG tablet Commonly known as: Cialis Take 1 tablet (10 mg total) by mouth daily as needed for erectile dysfunction.   Travatan Z 0.004 % Soln ophthalmic solution Generic drug: Travoprost (BAK Free) Place 1 drop into both eyes at bedtime.       History (reviewed): Past Medical History:  Diagnosis Date  . Allergic rhinitis   . Allergy   . Aortic atherosclerosis ([place])   . Bladder disorder    takes bladder control meds  . BPH (benign prostatic hyperplasia)   . Cataract   . DDD (degenerative disc disease)   . Diverticulosis   . Glaucoma   . Hyperlipemia   . Hyperlipidemia   . Labyrinthitis   . Personal history of colonic polyps   . Skull fracture ([place]) 1955   from [place]   . Thrombocytopenia ([place])   . Tubular adenoma of colon   . Vitamin D deficiency    Past Surgical History:  Procedure Laterality Date  . COLON SURGERY     polyps   . COLONOSCOPY    . EYE SURGERY Bilateral    cataracts   . left knee surgery- microscopic  Left 80's   [facility]   . 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 TREATMENT FISTULA ANAL     Family History  Problem Relation Age of Onset  . Heart disease Mother   . Diabetes Mother        diet controlled  . Other Mother        tetanus / lock jaw  . Heart disease Brother   . Emphysema Brother        smoker  . Heart disease Father   . Emphysema Father        smoker  . Glaucoma Father   . Cancer Sister        brain and stomach / colon   . Emphysema Sister        smoker  . Lumbar disc disease Sister   . Hyperlipidemia Sister   . Hypertension Sister   . Heart disease Sister   . Polycythemia Son   . Heart disease Sister   . Hyperlipidemia Son   . Colon cancer Neg Hx   . Esophageal cancer Neg Hx   . Rectal cancer Neg  Hx   . Stomach cancer Neg Hx    Social History   Socioeconomic History  . Marital status: Married    Spouse name: Paul Parks  . Number of children: 2  . Years of education: Not on file  . Highest education level: Not on file  Occupational History  . 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 Smoking status: Never Smoker  . Smokeless tobacco: Never Used  Vaping Use  . Vaping Use: Never used  Substance and Sexual Activity  .  Alcohol use: Not Currently    Alcohol/week: 0.0 standard drinks    Comment: every 2 months 1-2 beers                            . Drug use: No  . Sexual activity: Yes  Other Topics Concern  . Not on file  Social History Narrative   Lives at home with wife, Paul Parks    Social Determinants of Health   Financial Resource Strain: Not on file  Food Insecurity: Not on file  Transportation Needs: Not on file  Physical Activity: Not on file  Stress: Not on file  Social Connections: Not on file    Activities of Daily Living In your present state of health, do you have any difficulty performing the following activities: 12/08/2020  Hearing? Y  Comment At times he feels like he may  Vision? N  Comment Wears glasses  Difficulty concentrating or making decisions? N  Walking or climbing stairs? Y  Comment At the moment due to current back pain  Dressing or bathing? N  Doing errands, shopping? N  Preparing Food and eating ? N  Using the Toilet? N  In the past six months, have you accidently leaked urine? Y  Comment At times due to prostate issues. He is currently seeing urologist  Do you have problems with loss of bowel control? N  Managing your Medications? N  Managing your Finances? N  Housekeeping or managing your Housekeeping? N  Some recent data might be hidden    Patient Education/ Literacy How often do you need to have someone help you when you read instructions, pamphlets, or other written  materials from your doctor or pharmacy?: 1 - Never What is the last grade level you completed in school?: Graduate Degree  Exercise Current Exercise Habits: Home exercise routine, Type of exercise: stretching;treadmill, Time (Minutes): 50, Frequency (Times/Week): 7, Weekly Exercise (Minutes/Week): 350, Intensity: Moderate, Exercise limited by: orthopedic condition(s) (Back pain)  Diet Patient reports consuming 3 meals a day and 1 snack(s) a day Patient reports that his primary diet is: Low fat Patient reports that she does have regular access to food.   Depression Screen PHQ 2/9 Scores 12/08/2020 11/12/2020 07/21/2020 05/13/2020 01/15/2020 12/10/2019 09/19/2019  PHQ - 2 Score 0 0 0 0 0 0 0  PHQ- 9 Score - - - 0 0 - 0     Fall Risk Fall Risk  12/08/2020 11/12/2020 11/12/2020 07/21/2020 05/13/2020  Falls in the past year? 1 1 0 0 0  Number falls in past yr: 0 0 - - -  Injury with Fall? 1 1 - - -  Risk for fall due to : History of fall(s);Impaired balance/gait History of fall(s);Impaired balance/gait - - -  Follow up Falls prevention discussed Falls evaluation completed - - -     Objective:  Paul Parks seemed alert and oriented and he participated appropriately during our telephone visit.  Blood Pressure Weight BMI  BP Readings from Last 3 Encounters:  11/12/20 (!) 103/56  10/27/20 (!) 142/54  10/09/20 (!) 144/68   Wt Readings from Last 3 Encounters:  11/12/20 185 lb (83.9 kg)  10/27/20 189 lb (85.7 kg)  10/09/20 184 lb 6.4 oz (83.6 kg)   BMI Readings from Last 1 Encounters:  11/12/20 26.54 kg/m    *Unable to obtain current vital signs, weight, and BMI due to telephone visit type  Hearing/Vision  . Akhari did not seem to  have difficulty with hearing/understanding during the telephone conversation . Reports that he has had a formal eye exam by an eye care professional within the past year . Reports that he has not had a formal hearing evaluation within the past year *Unable to fully  assess hearing and vision during telephone visit type  Cognitive Function: 6CIT Screen 12/08/2020  What Year? 0 points  What month? 0 points  What time? 0 points  Count back from 20 0 points  Months in reverse 0 points  Repeat phrase 0 points  Total Score 0   (Normal:0-7, Significant for Dysfunction: >8)  Normal Cognitive Function Screening: Yes   Immunization & Health Maintenance Record Immunization History  Administered Date(s) Administered  . Fluad Quad(high Dose 65+) 10/06/2020  . Influenza, High Dose Seasonal PF 10/13/2015, 09/22/2016, 10/05/2017, 10/04/2018  . Influenza,inj,Quad PF,6+ Mos 10/08/2013, 10/21/2014  . Moderna Sars-Covid-2 Vaccination 01/01/2020, 01/29/2020, 10/21/2020  . Pneumococcal Conjugate-13 02/12/2014  . Pneumococcal Polysaccharide-23 03/19/2009  . Td 01/15/2020  . Tdap 11/08/2011  . Zoster 01/22/2007    Health Maintenance  Topic Date Due  . COVID-19 Vaccine (4 - Booster for Moderna series) 04/20/2021  . COLONOSCOPY (Pts 45-79yrs Insurance coverage will need to be confirmed)  11/11/2022  . TETANUS/TDAP  01/14/2030  . INFLUENZA VACCINE  Completed  . PNA vac Low Risk Adult  Completed       Assessment  This is a routine wellness examination for Paul Parks.  Health Maintenance: Due or Overdue There are no preventive care reminders to display for this patient.  Paul Parks does not need a referral for Community Assistance: Care Management:   no Social Work:    no Prescription Assistance:  no Nutrition/Diabetes Education:  no   Plan:  Personalized Goals Goals Addressed            This Visit's Progress   . AWV       12/08/2020 AWV Goal: Fall Prevention  . Over the next year, patient will decrease their risk for falls by: o Using assistive devices, such as a cane or walker, as needed o Identifying fall risks within their home and correcting them by: - Removing throw rugs - Adding handrails to stairs or ramps - Removing clutter  and keeping a clear pathway throughout the home - Increasing light, especially at night - Adding shower handles/bars - Raising toilet seat o Identifying potential personal risk factors for falls: - Medication side effects - Incontinence/urgency - Vestibular dysfunction - Hearing loss - Musculoskeletal disorders - Neurological disorders - Orthostatic hypotension        Personalized Health Maintenance & Screening Recommendations  Up to date on Health Maintenance   Lung Cancer Screening Recommended: no (Low Dose CT Chest recommended if Age 59-80 years, 30 pack-year currently smoking OR have quit w/in past 15 years) Hepatitis C Screening recommended: no HIV Screening recommended: no  Advanced Directives: Written information was not prepared per patient's request.  Referrals & Orders No orders of the defined types were placed in this encounter.   Follow-up Plan . Follow-up with Paul Norlander, DO as planned . Keep upcoming appointment with Neurosurgery  . AVS Printed and mailed to patient    I have personally reviewed and noted the following in the patient's chart:   . Medical and social history . Use of alcohol, tobacco or illicit drugs  . Current medications and supplements . Functional ability and status . Nutritional status . Physical activity . Advanced directives . List of other physicians .  Hospitalizations, surgeries, and ER visits in previous 12 months . Vitals . Screenings to include cognitive, depression, and falls . Referrals and appointments  In addition, I have reviewed and discussed with Paul Parks certain preventive protocols, quality metrics, and best practice recommendations. A written personalized care plan for preventive services as well as general preventive health recommendations is available and can be mailed to the patient at his request.      Billee Cashing, LPN  03/14/4495

## 2020-12-17 DIAGNOSIS — M5416 Radiculopathy, lumbar region: Secondary | ICD-10-CM | POA: Diagnosis not present

## 2020-12-17 DIAGNOSIS — Z6828 Body mass index (BMI) 28.0-28.9, adult: Secondary | ICD-10-CM | POA: Diagnosis not present

## 2020-12-18 ENCOUNTER — Telehealth: Payer: Self-pay | Admitting: *Deleted

## 2020-12-18 NOTE — Telephone Encounter (Signed)
   [facility] Pre-operative Risk Assessment    HEARTCARE STAFF: - Please ensure there is not already an duplicate clearance open for this procedure. - Under Visit Info/Reason for Call, type in Other and utilize the format Clearance MM/DD/YY or Clearance TBD. Do not use dashes or single digits. - If request is for dental extraction, please clarify the # of teeth to be extracted.  Request for surgical clearance:  1. What type of surgery is being performed?  L4-5 LUMBAR LAMINECTOMY   2. When is this surgery scheduled?  TBD   3. What type of clearance is required (medical clearance vs. Pharmacy clearance to hold med vs. Both)?  BOTH  4. Are there any medications that need to be held prior to surgery and how long? ASPIRIN   5. Practice name and name of physician performing surgery?  [facility] /   6. What is the office phone number?   [phone]   7.   What is the office fax number?  [phone]  8.   Anesthesia type (None, local, MAC, general) ?  GENERAL   Jeanann Lewandowsky 12/18/2020, 11:16 AM  _________________________________________________________________   (provider comments below)

## 2020-12-21 NOTE — Telephone Encounter (Signed)
   Primary Electrophysiologist: [name], MD  Chart reviewed as part of pre-operative protocol coverage. Mr.  Paul Parks is an 85 yo man with hx of sinus dose dysfunction, PVCs, hypertension, lower extremity edema on low dose Lasix.   He was last seen 10/27/20 by Oda Kilts, PA. St that time he had completed ZIO patch 10/2020 with 1st degree AV block, second degree AV block, Mobitz I/Wenckeback, and intermittent CHB mostly nocturnal. He was asymptomatic with no lightheadedness. Monitor reviewed by Dr. Lovena Le who recommended 3-4 month follow up. Office visit note states "if he is considered for back surgery, may need to consider PPM sooner if considering general anesthesia".   As we have received request for L4-5 lumbar laminectomy, will route to Dr. Lovena Le as well as Oda Kilts, PA for their review. Anticipate patient will need appointment for discussion of potential PPM prior to planned procedure.   [name], NP  12/21/2020, 10:45 AM

## 2020-12-22 NOTE — Telephone Encounter (Signed)
He may proceed with surgery. He is an acceptable risk.

## 2020-12-23 DIAGNOSIS — N5201 Erectile dysfunction due to arterial insufficiency: Secondary | ICD-10-CM | POA: Diagnosis not present

## 2020-12-23 DIAGNOSIS — R351 Nocturia: Secondary | ICD-10-CM | POA: Diagnosis not present

## 2020-12-23 DIAGNOSIS — N401 Enlarged prostate with lower urinary tract symptoms: Secondary | ICD-10-CM | POA: Diagnosis not present

## 2020-12-23 DIAGNOSIS — N3281 Overactive bladder: Secondary | ICD-10-CM | POA: Diagnosis not present

## 2020-12-24 ENCOUNTER — Ambulatory Visit: Payer: Medicare PPO | Admitting: Internal Medicine

## 2020-12-24 NOTE — Telephone Encounter (Signed)
   Primary Cardiologist: Dr. Crissie Sickles  Chart reviewed as part of pre-operative protocol coverage. Patient was contacted 12/24/2020 in reference to pre-operative risk assessment for pending surgery as outlined below.  Paul Parks was last seen on 10/27/20 by Oda Kilts, PA.  Since that day, Adalid Beckmann has done well. Case reviewed by Dr. Lovena Le who agrees that the patient is at an acceptable risk for the planned procedure.   Therefore, based on ACC/AHA guidelines, the patient would be at acceptable risk for the planned procedure without further cardiovascular testing.   He has a known history of 1st degree AV block, second degree AV block, Mobitz I/Wenckeback, and intermittent CHB which was mostly nocturnal. He does not meet criteria for PPM at this time. Telemetry monitoring throughout the procedure would be recommended.  As he has no history of coronary artery disease, may hold aspirin at the direction of his neurosurgeon.   The patient was advised that if he develops new symptoms prior to surgery to contact our office to arrange for a follow-up visit, and he verbalized understanding.  I will route this recommendation to the requesting party via Epic fax function and remove from pre-op pool. Please call with questions.  [name], NP 12/24/2020, 8:37 AM

## 2020-12-25 ENCOUNTER — Other Ambulatory Visit: Payer: Self-pay | Admitting: Neurological Surgery

## 2020-12-25 DIAGNOSIS — M48061 Spinal stenosis, lumbar region without neurogenic claudication: Secondary | ICD-10-CM

## 2021-01-11 ENCOUNTER — Other Ambulatory Visit: Payer: Self-pay

## 2021-01-11 ENCOUNTER — Encounter (HOSPITAL_COMMUNITY)
Admission: RE | Admit: 2021-01-11 | Discharge: 2021-01-11 | Disposition: A | Discharge status: Home / Self Care | Payer: Medicare PPO | Source: Ambulatory Visit | Attending: Neurological Surgery | Admitting: Neurological Surgery

## 2021-01-11 ENCOUNTER — Other Ambulatory Visit (HOSPITAL_COMMUNITY)
Admission: RE | Admit: 2021-01-11 | Discharge: 2021-01-11 | Disposition: A | Discharge status: Home / Self Care | Payer: Medicare PPO | Source: Ambulatory Visit | Attending: Neurological Surgery | Admitting: Neurological Surgery

## 2021-01-11 ENCOUNTER — Encounter (HOSPITAL_COMMUNITY): Payer: Self-pay

## 2021-01-11 ENCOUNTER — Ambulatory Visit (HOSPITAL_COMMUNITY)
Admission: RE | Admit: 2021-01-11 | Discharge: 2021-01-11 | Disposition: A | Discharge status: Home / Self Care | Payer: Medicare PPO | Source: Ambulatory Visit | Attending: Neurological Surgery | Admitting: Neurological Surgery

## 2021-01-11 DIAGNOSIS — Z20822 Contact with and (suspected) exposure to covid-19: Secondary | ICD-10-CM | POA: Diagnosis not present

## 2021-01-11 DIAGNOSIS — M47816 Spondylosis without myelopathy or radiculopathy, lumbar region: Secondary | ICD-10-CM | POA: Diagnosis not present

## 2021-01-11 DIAGNOSIS — M48061 Spinal stenosis, lumbar region without neurogenic claudication: Secondary | ICD-10-CM | POA: Insufficient documentation

## 2021-01-11 DIAGNOSIS — S2241XA Multiple fractures of ribs, right side, initial encounter for closed fracture: Secondary | ICD-10-CM | POA: Diagnosis not present

## 2021-01-11 DIAGNOSIS — M47814 Spondylosis without myelopathy or radiculopathy, thoracic region: Secondary | ICD-10-CM | POA: Diagnosis not present

## 2021-01-11 HISTORY — DX: Essential (primary) hypertension: I10

## 2021-01-11 HISTORY — DX: Sick sinus syndrome: I49.5

## 2021-01-11 LAB — SURGICAL PCR SCREEN
MRSA, PCR: NEGATIVE
Staphylococcus aureus: NEGATIVE

## 2021-01-11 LAB — CBC WITH DIFFERENTIAL/PLATELET
Abs Immature Granulocytes: 0.01 10*3/uL (ref 0.00–0.07)
Basophils Absolute: 0 10*3/uL (ref 0.0–0.1)
Basophils Relative: 1 %
Eosinophils Absolute: 0.2 10*3/uL (ref 0.0–0.5)
Eosinophils Relative: 2 %
HCT: 40.3 % (ref 39.0–52.0)
Hemoglobin: 14 g/dL (ref 13.0–17.0)
Immature Granulocytes: 0 %
Lymphocytes Relative: 31 %
Lymphs Abs: 2.5 10*3/uL (ref 0.7–4.0)
MCH: 31.5 pg (ref 26.0–34.0)
MCHC: 34.7 g/dL (ref 30.0–36.0)
MCV: 90.8 fL (ref 80.0–100.0)
Monocytes Absolute: 0.7 10*3/uL (ref 0.1–1.0)
Monocytes Relative: 9 %
Neutro Abs: 4.4 10*3/uL (ref 1.7–7.7)
Neutrophils Relative %: 57 %
Platelets: 152 10*3/uL (ref 150–400)
RBC: 4.44 MIL/uL (ref 4.22–5.81)
RDW: 13 % (ref 11.5–15.5)
WBC: 7.8 10*3/uL (ref 4.0–10.5)
nRBC: 0 % (ref 0.0–0.2)

## 2021-01-11 LAB — BASIC METABOLIC PANEL
Anion gap: 11 (ref 5–15)
BUN: 22 mg/dL (ref 8–23)
CO2: 27 mmol/L (ref 22–32)
Calcium: 9.4 mg/dL (ref 8.9–10.3)
Chloride: 99 mmol/L (ref 98–111)
Creatinine, Ser: 1.27 mg/dL — ABNORMAL HIGH (ref 0.61–1.24)
GFR, Estimated: 56 mL/min — ABNORMAL LOW (ref >60)
Glucose, Bld: 94 mg/dL (ref 70–99)
Potassium: 4.3 mmol/L (ref 3.5–5.1)
Sodium: 137 mmol/L (ref 135–145)

## 2021-01-11 LAB — PROTIME-INR
INR: 1.1 (ref 0.8–1.2)
Prothrombin Time: 13.5 seconds (ref 11.4–15.2)

## 2021-01-11 NOTE — Progress Notes (Signed)
PCP - Dr. Lajuana Ripple Cardiologist - Dr. Lovena Le  PPM/ICD - n/a Device Orders -  Rep Notified -   Chest x-ray - 01/11/21 EKG - 01/11/21 Stress Test - 2014 ECHO - 10/2020 Cardiac Cath - patient denies  Sleep Study - n/a CPAP -   Fasting Blood Sugar - n/a Checks Blood Sugar _____ times a day  Blood Thinner Instructions: Aspirin Instructions: stopped 2 weeks ago  ERAS Protcol - PRE-SURGERY Ensure or G2-   COVID TEST- 01/11/21   Anesthesia review: yes, recent echo, history of sinus node dysfunction  Patient denies shortness of breath, fever, cough and chest pain at PAT appointment   All instructions explained to the patient, with a verbal understanding of the material. Patient agrees to go over the instructions while at home for a better understanding. Patient also instructed to self quarantine after being tested for COVID-19. The opportunity to ask questions was provided.

## 2021-01-11 NOTE — Progress Notes (Signed)
Surgical Instructions    Your procedure is scheduled on Wednesday February 9th.  Report to [facility] Main Entrance "A" at 9:55 A.M., then check in with the Admitting office.  Call this number if you have problems the morning of surgery:  [phone]   If you have any questions prior to your surgery date call [phone]: Open Monday-Friday 8am-4pm    Remember:  Do not eat or drink anything after midnight the night before your surgery   Take these medicines the morning of surgery with A SIP OF WATER   alfuzosin (UROXATRAL) 10 MG 24 hr tablet  amLODipine (NORVASC) 2.5 MG tablet  rosuvastatin (CRESTOR) 10 MG tablet   IF NEEDED  fluticasone (FLONASE) 50 MCG/ACT nasal spray  meclizine (ANTIVERT) 12.5 MG tablet  Follow your surgeon's instructions on when to stop Aspirin.  If no instructions were given by your surgeon then you will need to call the office to get those instructions.    As of today, STOP taking any Aspirin (unless otherwise instructed by your surgeon) Aleve, Naproxen, Ibuprofen, Motrin, Advil, Goody's, BC's, all herbal medications, fish oil, and all vitamins.                     Do not wear jewelry            Do not wear lotions, powders, colognes, or deodorant.            Do not shave 48 hours prior to surgery.  Men may shave face and neck.            Do not bring valuables to the hospital.            [facility] is not responsible for any belongings or valuables.  Do NOT Smoke (Tobacco/Vaping) or drink Alcohol 24 hours prior to your procedure If you use a CPAP at night, you may bring all equipment for your overnight stay.   Contacts, glasses, dentures or bridgework may not be worn into surgery, please bring cases for these belongings   For patients admitted to the hospital, discharge time will be determined by your treatment team.   Patients discharged the day of surgery will not be allowed to drive home, and someone needs to stay with them for 24  hours.    Special instructions:   [facility]- Preparing For Surgery  Before surgery, you can play an important role. Because skin is not sterile, your skin needs to be as free of germs as possible. You can reduce the number of germs on your skin by washing with CHG (chlorahexidine gluconate) Soap before surgery.  CHG is an antiseptic cleaner which kills germs and bonds with the skin to continue killing germs even after washing.    Oral Hygiene is also important to reduce your risk of infection.  Remember - BRUSH YOUR TEETH THE MORNING OF SURGERY WITH YOUR REGULAR TOOTHPASTE  Please do not use if you have an allergy to CHG or antibacterial soaps. If your skin becomes reddened/irritated stop using the CHG.  Do not shave (including legs and underarms) for at least 48 hours prior to first CHG shower. It is OK to shave your face.  Please follow these instructions carefully.   1. If you chose to wash your hair, wash your hair first as usual with your normal shampoo.  2. After you shampoo, rinse your hair and body thoroughly to remove the shampoo.  3. Wash Face and genitals (private parts) with your normal  soap.   4. THEN Shower the NIGHT BEFORE SURGERY and the MORNING OF SURGERY with CHG Soap.   5. Use CHG as you would any other liquid soap. You can apply CHG directly to the skin and wash gently with a scrungie or a clean washcloth.   6. Apply the CHG Soap to your body ONLY FROM THE NECK DOWN.  Do not use on open wounds or open sores. Avoid contact with your eyes, ears, mouth and genitals (private parts). Wash Face and genitals (private parts)  with your normal soap.   7. Wash thoroughly, paying special attention to the area where your surgery will be performed.  8. Thoroughly rinse your body with warm water from the neck down.  9. DO NOT shower/wash with your normal soap after using and rinsing off the CHG Soap.  10. Pat yourself dry with a CLEAN TOWEL.  11. Wear CLEAN PAJAMAS to bed  the night before surgery  12. Place CLEAN SHEETS on your bed the night before your surgery  13. DO NOT SLEEP WITH PETS.   Day of Surgery: Wear Clean/Comfortable clothing the morning of surgery Do not apply any deodorants/lotions.   Remember to brush your teeth WITH YOUR REGULAR TOOTHPASTE.   Please read over the following fact sheets that you were given.

## 2021-01-12 LAB — SARS CORONAVIRUS 2 (TAT 6-24 HRS): SARS Coronavirus 2: NEGATIVE

## 2021-01-12 NOTE — Progress Notes (Signed)
Anesthesia Chart Review:  Follows with EP cardiology for history of sinus node dysfunction.  He has no symptoms of bradycardia.  Recent Zio patch with first-degree AV block, second-degree AV block, Mobitz 1/Wenckebach, and intermittent CHB, mostly nocturnal.  No symptoms.  Echo showed normal EF.  Last seen by Barrington Ellison, PA-C 10/27/2020.  Per note, the patient was discussed with Dr. Lovena Le.  Dr. Lovena Le recommended continued watchful waiting as long as patient remains asymptomatic.  Patient was cleared for surgery per telephone encounter 12/24/2020, "Chart reviewed as part of pre-operative protocol coverage. Patient was contacted 12/24/2020 in reference to pre-operative risk assessment for pending surgery as outlined below.  Paul Parks was last seen on 10/27/20 by Oda Kilts, PA.  Since that day, Paul Parks has done well. Case reviewed by Dr. Lovena Le who agrees that the patient is at an acceptable risk for the planned procedure.  Therefore, based on ACC/AHA guidelines, the patient would be at acceptable risk for the planned procedure without further cardiovascular testing. He has a known history of 1st degree AV block, second degree AV block, Mobitz I/Wenckeback, and intermittent CHB which was mostly nocturnal. He does not meet criteria for PPM at this time. Telemetry monitoring throughout the procedure would be recommended. As he has no history of coronary artery disease, may hold aspirin at the direction of his neurosurgeon."  Preop labs reviewed, unremarkable.  EKG 01/11/2021: Sinus rhythm with 1st degree A-V block with frequent Premature ventricular complexes in a pattern of bigeminy.  Rate 66. Septal infarct , age undetermined  Event monitor 10/27/2020: 1. NSR with sinus bradycardia and first degree AV block 2. Mostly nocturnal but also some evening CHB, self limited 3. Frequent PVC's which are polymorphic 4. No atrial fib or flutter.  5. No sustained VT or SVT  TTE 10/09/2020: 1. Left  ventricular ejection fraction, by estimation, is 60 to 65%. The  left ventricle has normal function. The left ventricle has no regional  wall motion abnormalities. There is mild concentric left ventricular  hypertrophy. Left ventricular diastolic  parameters are consistent with Grade I diastolic dysfunction (impaired  relaxation).  2. Right ventricular systolic function is normal. The right ventricular  size is normal.  3. The mitral valve is normal in structure. Trivial mitral valve  regurgitation.  4. The aortic valve is tricuspid. There is mild calcification of the  aortic valve. There is moderate thickening of the aortic valve. Aortic  valve regurgitation is mild.  5. Aortic dilatation noted. There is mild dilatation of the aortic root,  measuring 41 mm. There is mild to moderate dilatation of the ascending  aorta, measuring 39 mm.  6. The inferior vena cava is normal in size with greater than 50%  respiratory variability, suggesting right atrial pressure of 3 mmHg.   Comparison(s): Compared 05/2018, there is no significant change.    Wynonia Musty [facility] Short Stay Center/Anesthesiology Phone [phone] 01/12/2021 9:33 AM

## 2021-01-12 NOTE — Anesthesia Preprocedure Evaluation (Signed)
Anesthesia Evaluation  Patient identified by MRN, date of birth, ID band Patient awake    Reviewed: Allergy & Precautions, NPO status , Patient's Chart, lab work & pertinent test results  History of Anesthesia Complications Negative for: history of anesthetic complications  Airway Mallampati: II  TM Distance: >3 FB Neck ROM: Full    Dental no notable dental hx.    Pulmonary neg pulmonary ROS,    Pulmonary exam normal        Cardiovascular hypertension, Pt. on medications Normal cardiovascular exam     Neuro/Psych negative neurological ROS  negative psych ROS   GI/Hepatic negative GI ROS, Neg liver ROS,   Endo/Other  negative endocrine ROS  Renal/GU negative Renal ROS  negative genitourinary   Musculoskeletal  (+) Arthritis ,   Abdominal   Peds  Hematology negative hematology ROS (+)   Anesthesia Other Findings Day of surgery medications reviewed with patient.  Reproductive/Obstetrics negative OB ROS                           Anesthesia Physical Anesthesia Plan  ASA: II  Anesthesia Plan: General   Post-op Pain Management:    Induction: Intravenous  PONV Risk Score and Plan: 2 and Treatment may vary due to age or medical condition, Ondansetron and Dexamethasone  Airway Management Planned: Oral ETT  Additional Equipment: None  Intra-op Plan:   Post-operative Plan: Extubation in OR  Informed Consent: I have reviewed the patients History and Physical, chart, labs and discussed the procedure including the risks, benefits and alternatives for the proposed anesthesia with the patient or authorized representative who has indicated his/her understanding and acceptance.     Dental advisory given  Plan Discussed with: CRNA  Anesthesia Plan Comments: (PAT note by Karoline Caldwell, PA-C: Follows with EP cardiology for history of sinus node dysfunction.  He has no symptoms of bradycardia.   Recent Zio patch with first-degree AV block, second-degree AV block, Mobitz 1/Wenckebach, and intermittent CHB, mostly nocturnal.  No symptoms.  Echo showed normal EF.  Last seen by Barrington Ellison, PA-C 10/27/2020.  Per note, the patient was discussed with Dr. Lovena Le.  Dr. Lovena Le recommended continued watchful waiting as long as patient remains asymptomatic.  Patient was cleared for surgery per telephone encounter 12/24/2020, "Chart reviewed as part of pre-operative protocol coverage. Patient was contacted 12/24/2020 in reference to pre-operative risk assessment for pending surgery as outlined below.  Jasmine Maceachern was last seen on 10/27/20 by Oda Kilts, PA.  Since that day, Janoah Menna has done well. Case reviewed by Dr. Lovena Le who agrees that the patient is at an acceptable risk for the planned procedure.  Therefore, based on ACC/AHA guidelines, the patient would be at acceptable risk for the planned procedure without further cardiovascular testing. He has a known history of 1st degree AV block, second degree AV block, Mobitz I/Wenckeback, and intermittent CHB which was mostly nocturnal. He does not meet criteria for PPM at this time. Telemetry monitoring throughout the procedure would be recommended. As he has no history of coronary artery disease, may hold aspirin at the direction of his neurosurgeon."  Preop labs reviewed, unremarkable.  EKG 01/11/2021: Sinus rhythm with 1st degree A-V block with frequent Premature ventricular complexes in a pattern of bigeminy.  Rate 66. Septal infarct , age undetermined  Event monitor 10/27/2020: 1. NSR with sinus bradycardia and first degree AV block 2. Mostly nocturnal but also some evening CHB, self limited 3. Frequent  PVC's which are polymorphic 4. No atrial fib or flutter.  5. No sustained VT or SVT  TTE 10/09/2020: 1. Left ventricular ejection fraction, by estimation, is 60 to 65%. The  left ventricle has normal function. The left ventricle has no  regional  wall motion abnormalities. There is mild concentric left ventricular  hypertrophy. Left ventricular diastolic  parameters are consistent with Grade I diastolic dysfunction (impaired  relaxation).  2. Right ventricular systolic function is normal. The right ventricular  size is normal.  3. The mitral valve is normal in structure. Trivial mitral valve  regurgitation.  4. The aortic valve is tricuspid. There is mild calcification of the  aortic valve. There is moderate thickening of the aortic valve. Aortic  valve regurgitation is mild.  5. Aortic dilatation noted. There is mild dilatation of the aortic root,  measuring 41 mm. There is mild to moderate dilatation of the ascending  aorta, measuring 39 mm.  6. The inferior vena cava is normal in size with greater than 50%  respiratory variability, suggesting right atrial pressure of 3 mmHg.   Comparison(s): Compared 05/2018, there is no significant change.   )       Anesthesia Quick Evaluation

## 2021-01-13 ENCOUNTER — Ambulatory Visit (HOSPITAL_COMMUNITY): Payer: Medicare PPO | Admitting: Physician Assistant

## 2021-01-13 ENCOUNTER — Ambulatory Visit (HOSPITAL_COMMUNITY): Payer: Medicare PPO | Admitting: Certified Registered Nurse Anesthetist

## 2021-01-13 ENCOUNTER — Ambulatory Visit (HOSPITAL_COMMUNITY): Payer: Medicare PPO

## 2021-01-13 ENCOUNTER — Encounter (HOSPITAL_COMMUNITY)
Admission: RE | Disposition: A | Discharge status: Home / Self Care | Payer: Self-pay | Source: Home / Self Care | Attending: Neurological Surgery

## 2021-01-13 ENCOUNTER — Encounter (HOSPITAL_COMMUNITY): Payer: Self-pay | Admitting: Neurological Surgery

## 2021-01-13 ENCOUNTER — Ambulatory Visit (HOSPITAL_COMMUNITY)
Admission: RE | Admit: 2021-01-13 | Discharge: 2021-01-13 | Disposition: A | Discharge status: Home / Self Care | Payer: Medicare PPO | Attending: Neurological Surgery | Admitting: Neurological Surgery

## 2021-01-13 ENCOUNTER — Other Ambulatory Visit: Payer: Self-pay

## 2021-01-13 DIAGNOSIS — Z7982 Long term (current) use of aspirin: Secondary | ICD-10-CM | POA: Diagnosis not present

## 2021-01-13 DIAGNOSIS — Z882 Allergy status to sulfonamides status: Secondary | ICD-10-CM | POA: Insufficient documentation

## 2021-01-13 DIAGNOSIS — M48061 Spinal stenosis, lumbar region without neurogenic claudication: Secondary | ICD-10-CM | POA: Diagnosis not present

## 2021-01-13 DIAGNOSIS — Z888 Allergy status to other drugs, medicaments and biological substances status: Secondary | ICD-10-CM | POA: Diagnosis not present

## 2021-01-13 DIAGNOSIS — Z79899 Other long term (current) drug therapy: Secondary | ICD-10-CM | POA: Diagnosis not present

## 2021-01-13 DIAGNOSIS — Z9889 Other specified postprocedural states: Secondary | ICD-10-CM | POA: Diagnosis not present

## 2021-01-13 DIAGNOSIS — Z419 Encounter for procedure for purposes other than remedying health state, unspecified: Secondary | ICD-10-CM

## 2021-01-13 DIAGNOSIS — Z886 Allergy status to analgesic agent status: Secondary | ICD-10-CM | POA: Diagnosis not present

## 2021-01-13 DIAGNOSIS — E785 Hyperlipidemia, unspecified: Secondary | ICD-10-CM | POA: Diagnosis not present

## 2021-01-13 DIAGNOSIS — M4726 Other spondylosis with radiculopathy, lumbar region: Secondary | ICD-10-CM | POA: Diagnosis not present

## 2021-01-13 DIAGNOSIS — I10 Essential (primary) hypertension: Secondary | ICD-10-CM | POA: Diagnosis not present

## 2021-01-13 HISTORY — PX: LUMBAR LAMINECTOMY/DECOMPRESSION MICRODISCECTOMY: SHX5026

## 2021-01-13 SURGERY — LUMBAR LAMINECTOMY/DECOMPRESSION MICRODISCECTOMY 1 LEVEL
Anesthesia: General | Site: Back | Laterality: Right

## 2021-01-13 MED ORDER — MENTHOL 3 MG MT LOZG
1.0000 | LOZENGE | OROMUCOSAL | Status: DC | PRN
Start: 2021-01-13 — End: 2021-01-13

## 2021-01-13 MED ORDER — CEFAZOLIN SODIUM-DEXTROSE 2-4 GM/100ML-% IV SOLN: 2.0000 g | Freq: Three times a day (TID) | INTRAVENOUS | Status: DC

## 2021-01-13 MED ORDER — BUPIVACAINE HCL (PF) 0.25 % IJ SOLN
INTRAMUSCULAR | Status: AC
  Filled 2021-01-13: qty 30

## 2021-01-13 MED ORDER — PROPOFOL 10 MG/ML IV BOLUS
INTRAVENOUS | Status: AC
  Filled 2021-01-13: qty 20

## 2021-01-13 MED ORDER — LIDOCAINE 2% (20 MG/ML) 5 ML SYRINGE
INTRAMUSCULAR | Status: DC | PRN
  Administered 2021-01-13: 100 mg via INTRAVENOUS

## 2021-01-13 MED ORDER — ROCURONIUM BROMIDE 10 MG/ML (PF) SYRINGE
PREFILLED_SYRINGE | INTRAVENOUS | Status: DC | PRN
  Administered 2021-01-13: 20 mg via INTRAVENOUS
  Administered 2021-01-13: 60 mg via INTRAVENOUS

## 2021-01-13 MED ORDER — THROMBIN 20000 UNITS EX SOLR
CUTANEOUS | Status: AC
  Filled 2021-01-13: qty 20000

## 2021-01-13 MED ORDER — ONDANSETRON HCL 4 MG/2ML IJ SOLN
INTRAMUSCULAR | Status: DC | PRN
  Administered 2021-01-13: 4 mg via INTRAVENOUS

## 2021-01-13 MED ORDER — CHLORHEXIDINE GLUCONATE CLOTH 2 % EX PADS: 6.0000 | MEDICATED_PAD | Freq: Once | CUTANEOUS | Status: DC

## 2021-01-13 MED ORDER — SODIUM CHLORIDE 0.9 % IV SOLN: 250.0000 mL | INTRAVENOUS | Status: DC

## 2021-01-13 MED ORDER — FENTANYL CITRATE (PF) 250 MCG/5ML IJ SOLN
INTRAMUSCULAR | Status: AC
  Filled 2021-01-13: qty 5

## 2021-01-13 MED ORDER — HYDROCODONE-ACETAMINOPHEN 7.5-325 MG PO TABS: 1.0000 | ORAL_TABLET | Freq: Four times a day (QID) | ORAL | Status: DC

## 2021-01-13 MED ORDER — ONDANSETRON HCL 4 MG PO TABS: 4.0000 mg | ORAL_TABLET | Freq: Four times a day (QID) | ORAL | Status: DC | PRN

## 2021-01-13 MED ORDER — ACETAMINOPHEN 650 MG RE SUPP: 650.0000 mg | RECTAL | Status: DC | PRN

## 2021-01-13 MED ORDER — FENTANYL CITRATE (PF) 250 MCG/5ML IJ SOLN
INTRAMUSCULAR | Status: DC | PRN
  Administered 2021-01-13: 100 ug via INTRAVENOUS

## 2021-01-13 MED ORDER — THROMBIN 5000 UNITS EX SOLR
OROMUCOSAL | Status: DC | PRN
  Administered 2021-01-13: 5 mL via TOPICAL

## 2021-01-13 MED ORDER — SODIUM CHLORIDE 0.9% FLUSH: 3.0000 mL | INTRAVENOUS | Status: DC | PRN

## 2021-01-13 MED ORDER — THROMBIN 5000 UNITS EX SOLR
CUTANEOUS | Status: AC
  Filled 2021-01-13: qty 5000

## 2021-01-13 MED ORDER — BUPIVACAINE HCL (PF) 0.25 % IJ SOLN
INTRAMUSCULAR | Status: DC | PRN
  Administered 2021-01-13: 10 mL
  Administered 2021-01-13: 4 mL

## 2021-01-13 MED ORDER — DEXAMETHASONE SODIUM PHOSPHATE 10 MG/ML IJ SOLN
10.0000 mg | Freq: Once | INTRAMUSCULAR | Status: AC
  Administered 2021-01-13: 10 mg via INTRAVENOUS
  Filled 2021-01-13: qty 1

## 2021-01-13 MED ORDER — ACETAMINOPHEN 325 MG PO TABS: 650.0000 mg | ORAL_TABLET | ORAL | Status: DC | PRN

## 2021-01-13 MED ORDER — LACTATED RINGERS IV SOLN: INTRAVENOUS | Status: DC

## 2021-01-13 MED ORDER — CEFAZOLIN SODIUM-DEXTROSE 2-4 GM/100ML-% IV SOLN
2.0000 g | INTRAVENOUS | Status: AC
  Administered 2021-01-13: 2 g via INTRAVENOUS
  Filled 2021-01-13: qty 100

## 2021-01-13 MED ORDER — POTASSIUM CHLORIDE IN NACL 20-0.9 MEQ/L-% IV SOLN: INTRAVENOUS | Status: DC

## 2021-01-13 MED ORDER — 0.9 % SODIUM CHLORIDE (POUR BTL) OPTIME
TOPICAL | Status: DC | PRN
  Administered 2021-01-13: 1000 mL

## 2021-01-13 MED ORDER — ONDANSETRON HCL 4 MG/2ML IJ SOLN: 4.0000 mg | Freq: Four times a day (QID) | INTRAMUSCULAR | Status: DC | PRN

## 2021-01-13 MED ORDER — CHLORHEXIDINE GLUCONATE 0.12 % MT SOLN
15.0000 mL | Freq: Once | OROMUCOSAL | Status: AC
  Administered 2021-01-13: 15 mL via OROMUCOSAL
  Filled 2021-01-13: qty 15

## 2021-01-13 MED ORDER — ORAL CARE MOUTH RINSE: 15.0000 mL | Freq: Once | OROMUCOSAL | Status: AC

## 2021-01-13 MED ORDER — PHENYLEPHRINE HCL-NACL 10-0.9 MG/250ML-% IV SOLN
INTRAVENOUS | Status: DC | PRN
  Administered 2021-01-13: 60 ug/min via INTRAVENOUS

## 2021-01-13 MED ORDER — HYDROCODONE-ACETAMINOPHEN 5-325 MG PO TABS: 1.0000 | ORAL_TABLET | ORAL | 0 refills | Status: DC | PRN

## 2021-01-13 MED ORDER — EPHEDRINE SULFATE-NACL 50-0.9 MG/10ML-% IV SOSY
PREFILLED_SYRINGE | INTRAVENOUS | Status: DC | PRN
  Administered 2021-01-13: 10 mg via INTRAVENOUS
  Administered 2021-01-13: 15 mg via INTRAVENOUS

## 2021-01-13 MED ORDER — MORPHINE SULFATE (PF) 2 MG/ML IV SOLN
INTRAVENOUS | Status: AC
  Filled 2021-01-13: qty 1

## 2021-01-13 MED ORDER — SENNA 8.6 MG PO TABS
1.0000 | ORAL_TABLET | Freq: Two times a day (BID) | ORAL | Status: DC
Start: 2021-01-13 — End: 2021-01-13

## 2021-01-13 MED ORDER — THROMBIN 20000 UNITS EX SOLR
CUTANEOUS | Status: DC | PRN
  Administered 2021-01-13: 20 mL via TOPICAL

## 2021-01-13 MED ORDER — MORPHINE SULFATE (PF) 2 MG/ML IV SOLN
2.0000 mg | INTRAVENOUS | Status: DC | PRN
  Administered 2021-01-13: 2 mg via INTRAVENOUS

## 2021-01-13 MED ORDER — PROPOFOL 10 MG/ML IV BOLUS
INTRAVENOUS | Status: DC | PRN
  Administered 2021-01-13: 150 mg via INTRAVENOUS

## 2021-01-13 MED ORDER — SUGAMMADEX SODIUM 200 MG/2ML IV SOLN
INTRAVENOUS | Status: DC | PRN
  Administered 2021-01-13: 300 mg via INTRAVENOUS

## 2021-01-13 MED ORDER — PHENOL 1.4 % MT LIQD: 1.0000 | OROMUCOSAL | Status: DC | PRN

## 2021-01-13 MED ORDER — SODIUM CHLORIDE 0.9% FLUSH: 3.0000 mL | Freq: Two times a day (BID) | INTRAVENOUS | Status: DC

## 2021-01-13 SURGICAL SUPPLY — 48 items
APL SKNCLS STERI-STRIP NONHPOA (GAUZE/BANDAGES/DRESSINGS) ×1
BAND INSRT 18 STRL LF DISP RB (MISCELLANEOUS)
BAND RUBBER #18 3X1/16 STRL (MISCELLANEOUS) ×2 IMPLANT
BENZOIN TINCTURE PRP APPL 2/3 (GAUZE/BANDAGES/DRESSINGS) ×2 IMPLANT
BUR CARBIDE MATCH 3.0 (BURR) ×2 IMPLANT
CANISTER SUCT 3000ML PPV (MISCELLANEOUS) ×2 IMPLANT
COVER WAND RF STERILE (DRAPES) ×2 IMPLANT
DIFFUSER DRILL AIR PNEUMATIC (MISCELLANEOUS) ×2 IMPLANT
DRAPE LAPAROTOMY 100X72X124 (DRAPES) ×2 IMPLANT
DRAPE MICROSCOPE LEICA (MISCELLANEOUS) ×1 IMPLANT
DRAPE SURG 17X23 STRL (DRAPES) ×2 IMPLANT
DRSG OPSITE POSTOP 4X6 (GAUZE/BANDAGES/DRESSINGS) ×1 IMPLANT
DURAPREP 26ML APPLICATOR (WOUND CARE) ×2 IMPLANT
ELECT REM PT RETURN 9FT ADLT (ELECTROSURGICAL) ×2
ELECTRODE REM PT RTRN 9FT ADLT (ELECTROSURGICAL) ×1 IMPLANT
GAUZE 4X4 16PLY RFD (DISPOSABLE) IMPLANT
GLOVE BIO SURGEON STRL SZ7 (GLOVE) ×1 IMPLANT
GLOVE BIO SURGEON STRL SZ8 (GLOVE) ×2 IMPLANT
GLOVE BIOGEL PI IND STRL 7.0 (GLOVE) IMPLANT
GLOVE BIOGEL PI INDICATOR 7.0 (GLOVE) ×1
GLOVE ECLIPSE 6.0 STRL STRAW (GLOVE) ×1 IMPLANT
GLOVE SURG SS PI 7.0 STRL IVOR (GLOVE) ×3 IMPLANT
GLOVE SURG SS PI 7.5 STRL IVOR (GLOVE) ×2 IMPLANT
GOWN STRL REUS W/ TWL LRG LVL3 (GOWN DISPOSABLE) IMPLANT
GOWN STRL REUS W/ TWL XL LVL3 (GOWN DISPOSABLE) ×1 IMPLANT
GOWN STRL REUS W/TWL 2XL LVL3 (GOWN DISPOSABLE) IMPLANT
GOWN STRL REUS W/TWL LRG LVL3 (GOWN DISPOSABLE) ×4
GOWN STRL REUS W/TWL XL LVL3 (GOWN DISPOSABLE) ×2
HEMOSTAT POWDER SURGIFOAM 1G (HEMOSTASIS) ×1 IMPLANT
KIT BASIN OR (CUSTOM PROCEDURE TRAY) ×2 IMPLANT
KIT TURNOVER KIT B (KITS) ×2 IMPLANT
NDL HYPO 25X1 1.5 SAFETY (NEEDLE) ×1 IMPLANT
NDL SPNL 20GX3.5 QUINCKE YW (NEEDLE) IMPLANT
NEEDLE HYPO 25X1 1.5 SAFETY (NEEDLE) ×2 IMPLANT
NEEDLE SPNL 20GX3.5 QUINCKE YW (NEEDLE) ×2 IMPLANT
NS IRRIG 1000ML POUR BTL (IV SOLUTION) ×2 IMPLANT
PACK LAMINECTOMY NEURO (CUSTOM PROCEDURE TRAY) ×2 IMPLANT
PAD ARMBOARD 7.5X6 YLW CONV (MISCELLANEOUS) ×3 IMPLANT
SPONGE SURGIFOAM ABS GEL 100 (HEMOSTASIS) ×1 IMPLANT
SPONGE SURGIFOAM ABS GEL SZ50 (HEMOSTASIS) IMPLANT
STRIP CLOSURE SKIN 1/2X4 (GAUZE/BANDAGES/DRESSINGS) ×2 IMPLANT
SUT VIC AB 0 CT1 18XCR BRD8 (SUTURE) ×1 IMPLANT
SUT VIC AB 0 CT1 8-18 (SUTURE) ×2
SUT VIC AB 2-0 CP2 18 (SUTURE) ×2 IMPLANT
SUT VIC AB 3-0 SH 8-18 (SUTURE) ×2 IMPLANT
TOWEL GREEN STERILE (TOWEL DISPOSABLE) ×2 IMPLANT
TOWEL GREEN STERILE FF (TOWEL DISPOSABLE) ×2 IMPLANT
WATER STERILE IRR 1000ML POUR (IV SOLUTION) ×2 IMPLANT

## 2021-01-13 NOTE — Transfer of Care (Signed)
Immediate Anesthesia Transfer of Care Note  Patient: Paul Parks  Procedure(s) Performed: Laminectomy and Foraminotomy - Lumbar four-Lumbar five - right (Right Back)  Patient Location: PACU  Anesthesia Type:General  Level of Consciousness: drowsy, patient cooperative and responds to stimulation  Airway & Oxygen Therapy: Patient Spontanous Breathing  Post-op Assessment: Report given to RN and Post -op Vital signs reviewed and stable  Post vital signs: Reviewed and stable  Last Vitals:  Vitals Value Taken Time  BP    Temp    Pulse    Resp    SpO2      Last Pain:  Vitals:   01/13/21 0959  TempSrc:   PainSc: 1       Patients Stated Pain Goal: 3 ([id number])  Complications: No complications documented.

## 2021-01-13 NOTE — Anesthesia Procedure Notes (Signed)
Procedure Name: Intubation Date/Time: 01/13/2021 12:13 PM Performed by: Janace Litten, CRNA Pre-anesthesia Checklist: Patient identified, Emergency Drugs available, Suction available and Patient being monitored Patient Re-evaluated:Patient Re-evaluated prior to induction Oxygen Delivery Method: Circle System Utilized Preoxygenation: Pre-oxygenation with 100% oxygen Induction Type: IV induction Ventilation: Mask ventilation without difficulty Laryngoscope Size: Miller and 3 Grade View: Grade I Tube type: Oral Tube size: 7.5 mm Number of attempts: 1 Airway Equipment and Method: Stylet Placement Confirmation: ETT inserted through vocal cords under direct vision,  positive ETCO2 and breath sounds checked- equal and bilateral Secured at: 23 cm Tube secured with: Tape Dental Injury: Teeth and Oropharynx as per pre-operative assessment

## 2021-01-13 NOTE — Anesthesia Postprocedure Evaluation (Signed)
Anesthesia Post Note  Patient: Dixie Jafri  Procedure(s) Performed: Laminectomy and Foraminotomy - Lumbar four-Lumbar five - right (Right Back)     Patient location during evaluation: PACU Anesthesia Type: General Level of consciousness: awake and alert and oriented Pain management: pain level controlled Vital Signs Assessment: post-procedure vital signs reviewed and stable Respiratory status: spontaneous breathing, nonlabored ventilation and respiratory function stable Cardiovascular status: blood pressure returned to baseline Postop Assessment: no apparent nausea or vomiting Anesthetic complications: no   No complications documented.  Last Vitals:  Vitals:   01/13/21 1357 01/13/21 1412  BP: (!) 165/52 (!) 152/53  Pulse: 81 (!) 34  Resp: 20 14  Temp:    SpO2: 99% 99%    Last Pain:  Vitals:   01/13/21 1412  TempSrc:   PainSc: [place]

## 2021-01-13 NOTE — H&P (Signed)
Subjective: Patient is a 85 y.o. male admitted for R leg pain. Onset of symptoms was several months ago, gradually worsening since that time.  The pain is rated severe, and is located at the across the lower back and radiates to RLE. The pain is described as aching and occurs all day. The symptoms have been progressive. Symptoms are exacerbated by lying down and sitting. MRI or CT showed stenosis L4-5   Past Medical History:  Diagnosis Date  . Allergic rhinitis   . Allergy   . Aortic atherosclerosis ([place])   . Bladder disorder    takes bladder control meds  . BPH (benign prostatic hyperplasia)   . Cataract   . DDD (degenerative disc disease)   . Diverticulosis   . Glaucoma   . Hyperlipemia   . Hyperlipidemia   . Hypertension   . Labyrinthitis   . Personal history of colonic polyps   . Sinus node dysfunction ([facility])   . Skull fracture ([place]) 1955   from [place]   . Thrombocytopenia ([place])   . Tubular adenoma of colon   . Vitamin D deficiency     Past Surgical History:  Procedure Laterality Date  . COLON SURGERY     polyps   . COLONOSCOPY    . EYE SURGERY Bilateral    cataracts   . left knee surgery- microscopic  Left 80's   [facility]   . 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 [place]      Prior to Admission medications   Medication Sig Start Date End Date Taking? Authorizing Provider  alfuzosin (UROXATRAL) 10 MG 24 hr tablet Take 10 mg by mouth daily.  01/08/20  Yes [provider]  amLODipine (NORVASC) 2.5 MG tablet Take 1 tablet (2.5 mg total) by mouth daily. 01/24/20  Yes Evans Lance, MD  aspirin 81 MG tablet Take 81 mg by mouth daily.   Yes [provider]  cholecalciferol (VITAMIN D) 1000 UNITS tablet Take 1,000 Units by mouth daily.   Yes [provider]  fluticasone (FLONASE) 50 MCG/ACT nasal spray Place 1 spray into both nostrils daily as needed for allergies.   Yes [provider]  furosemide (LASIX) 20 MG tablet Take 1 tablet (20 mg total) by mouth daily. Patient taking differently: Take 20 mg by mouth 3 (three) times a week. 10/09/20 01/07/21 Yes Baldwin Jamaica, PA-C  Multiple Vitamin (MULTIVITAMIN WITH MINERALS) TABS tablet Take 1 tablet by mouth daily.   Yes [provider]  rosuvastatin (CRESTOR) 10 MG tablet TAKE 1 TABLET BY MOUTH 2 TIMES A WEEK Patient taking differently: Take by mouth 2 (two) times a week. 07/09/20  Yes Gottschalk, Leatrice Jewels M, DO  tadalafil (CIALIS) 10 MG tablet Take 1 tablet (10 mg total) by mouth daily as needed for erectile dysfunction. 05/13/20  Yes Gottschalk, Ashly M, DO  TRAVATAN Z 0.004 % SOLN ophthalmic solution Place 1 drop into both eyes at bedtime.  11/24/14  Yes [provider]  meclizine (ANTIVERT) 12.5 MG tablet Take 12.5 mg by mouth 4 (four) times daily as needed. Take one (1) tablet (12.5 mg) by mouth four (4) times daily with meals and at bedtime for dizziness as needed    [provider]   Allergies  Allergen Reactions  . Sulfa Antibiotics Itching    Rash   . Ibuprofen Swelling  . Gabapentin   . Vesicare [Solifenacin] Other (See  Comments)    HA     Social History   Tobacco Use  . Smoking status: Never Smoker  . Smokeless tobacco: Never Used  Substance Use Topics  . Alcohol use: Not Currently    Alcohol/week: 0.0 standard drinks    Comment: every 2 months 1-2 beers                              Family History  Problem Relation Age of Onset  . Heart disease Mother   . Diabetes Mother        diet controlled  . Other Mother        tetanus / lock jaw  . Heart disease Brother   . Emphysema Brother        smoker  . Heart disease Father   . Emphysema Father        smoker  . Glaucoma Father   . Cancer Sister        brain and stomach / colon   . Emphysema Sister        smoker  . Lumbar disc disease Sister   . Hyperlipidemia Sister   . Hypertension Sister   . Heart disease  Sister   . Polycythemia Son   . Heart disease Sister   . Hyperlipidemia Son   . Colon cancer Neg Hx   . Esophageal cancer Neg Hx   . Rectal cancer Neg Hx   . Stomach cancer Neg Hx      Review of Systems  Positive ROS: neg  All other systems have been reviewed and were otherwise negative with the exception of those mentioned in the HPI and as above.  Objective: Vital signs in last 24 hours: Temp:  [97.8 F (36.6 C)] 97.8 F (36.6 C) (02/09 0940) Pulse Rate:  [69] 69 (02/09 0940) Resp:  [18] 18 (02/09 0940) BP: (174)/(67) 174/67 (02/09 0940) SpO2:  [99 %] 99 % (02/09 0940) Weight:  [85.3 kg] 85.3 kg (02/09 0940)  General Appearance: Alert, cooperative, no distress, appears stated age Head: Normocephalic, without obvious abnormality, atraumatic Eyes: PERRL, conjunctiva/corneas clear, EOM's intact    Neck: Supple, symmetrical, trachea midline Back: Symmetric, no curvature, ROM normal, no CVA tenderness Lungs:  respirations unlabored Heart: Regular rate and rhythm Abdomen: Soft, non-tender Extremities: Extremities normal, atraumatic, no cyanosis or edema Pulses: 2+ and symmetric all extremities Skin: Skin color, texture, turgor normal, no rashes or lesions  NEUROLOGIC:   Mental status: Alert and oriented x4,  no aphasia, good attention span, fund of knowledge, and memory Motor Exam - grossly normal Sensory Exam - grossly normal Reflexes: 1+ Coordination - grossly normal Gait - grossly normal Balance - grossly normal Cranial Nerves: I: smell Not tested  II: visual acuity  OS: nl    OD: nl  II: visual fields Full to confrontation  II: pupils Equal, round, reactive to light  III,VII: ptosis None  III,IV,VI: extraocular muscles  Full ROM  V: mastication Normal  V: facial light touch sensation  Normal  V,VII: corneal reflex  Present  VII: facial muscle function - upper  Normal  VII: facial muscle function - lower Normal  VIII: hearing Not tested  IX: soft palate  elevation  Normal  IX,X: gag reflex Present  XI: trapezius strength  5/5  XI: sternocleidomastoid strength 5/5  XI: neck flexion strength  5/5  XII: tongue strength  Normal    Data Review Lab Results  Component  Value Date   WBC 7.8 01/11/2021   HGB 14.0 01/11/2021   HCT 40.3 01/11/2021   MCV 90.8 01/11/2021   PLT 152 01/11/2021   Lab Results  Component Value Date   NA 137 01/11/2021   K 4.3 01/11/2021   CL 99 01/11/2021   CO2 27 01/11/2021   BUN 22 01/11/2021   CREATININE 1.27 (H) 01/11/2021   GLUCOSE 94 01/11/2021   Lab Results  Component Value Date   INR 1.1 01/11/2021    Assessment/Plan:  Estimated body mass index is 26.98 kg/m as calculated from the following:   Height as of this encounter: 5\' 10"  (1.778 m).   Weight as of this encounter: 85.3 kg. Patient admitted for R L4-5 hemilaminectomy. Patient has failed a reasonable attempt at conservative therapy.  I explained the condition and procedure to the patient and answered any questions.  Patient wishes to proceed with procedure as planned. Understands risks/ benefits and typical outcomes of procedure.   Eustace Moore 01/13/2021 11:36 AM

## 2021-01-13 NOTE — Op Note (Signed)
01/13/2021  2:02 PM  PATIENT:  Paul Parks  85 y.o. 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 Ashok Pall  ANESTHESIA:   General  EBL: 30 ml  Total I/O In: 700 [I.V.:700] Out: 30 [Blood:30]  BLOOD ADMINISTERED: none  DRAINS: None  SPECIMEN:  none  INDICATION FOR PROCEDURE: This patient presented with right leg pain that seem to follow an L5 distribution. Imaging showed lateral recess stenosis at L4-5. The patient tried conservative measures without relief. Pain was debilitating. Recommended compressive hemilaminectomy. Patient understood the risks, benefits, and alternatives and potential outcomes and wished to proceed.  PROCEDURE DETAILS: The patient was taken to the operating room and after induction of adequate generalized endotracheal anesthesia, the patient was rolled into the prone position on the Wilson frame and all pressure points were padded. The lumbar region was cleaned and then prepped with DuraPrep and draped in the usual sterile fashion. 5 cc of local anesthesia was injected and then a dorsal midline incision was made and carried down to the lumbo sacral fascia. The fascia was opened and the paraspinous musculature was taken down in a subperiosteal fashion to expose L4-5 on the right. Intraoperative x-ray confirmed my level, and then I used a combination of the high-speed drill and the Kerrison punches to perform a hemilaminectomy, medial facetectomy, and foraminotomy at L4 5 on the right. The underlying yellow ligament was opened and removed in a piecemeal fashion to expose the underlying dura and exiting nerve root. I undercut the lateral recess and dissected down until I was medial to and distal to the pedicle.  I then palpated with a coronary dilator along the  nerve root and into the foramen to assure adequate decompression. I felt no more compression of the nerve root. I irrigated with saline solution containing bacitracin. Achieved hemostasis with bipolar cautery, lined the dura with Gelfoam, and then closed the fascia with 0 Vicryl. I closed the subcutaneous tissues with 2-0 Vicryl and the subcuticular tissues with 3-0 Vicryl. The skin was then closed with benzoin and Steri-Strips. The drapes were removed, a sterile dressing was applied.  My nurse practitioner was involved in the exposure, safe retraction of the neural elements, the disc work and the closure. the patient was awakened from general anesthesia and transferred to the recovery room in stable condition. At the end of the procedure all sponge, needle and instrument counts were correct.    PLAN OF CARE: Discharge to home after PACU  PATIENT DISPOSITION:  PACU - hemodynamically stable.   Delay start of Pharmacological VTE agent (>24hrs) due to surgical blood loss or risk of bleeding:  yes

## 2021-01-14 ENCOUNTER — Encounter (HOSPITAL_COMMUNITY): Payer: Self-pay | Admitting: Neurological Surgery

## 2021-01-31 ENCOUNTER — Other Ambulatory Visit: Payer: Self-pay | Admitting: Internal Medicine

## 2021-02-01 DIAGNOSIS — H401123 Primary open-angle glaucoma, left eye, severe stage: Secondary | ICD-10-CM | POA: Diagnosis not present

## 2021-02-01 DIAGNOSIS — H401112 Primary open-angle glaucoma, right eye, moderate stage: Secondary | ICD-10-CM | POA: Diagnosis not present

## 2021-02-01 DIAGNOSIS — Z79899 Other long term (current) drug therapy: Secondary | ICD-10-CM | POA: Diagnosis not present

## 2021-02-23 ENCOUNTER — Ambulatory Visit: Payer: Medicare PPO | Admitting: Internal Medicine

## 2021-03-03 ENCOUNTER — Ambulatory Visit (INDEPENDENT_AMBULATORY_CARE_PROVIDER_SITE_OTHER): Payer: Medicare PPO | Admitting: Otolaryngology

## 2021-03-03 ENCOUNTER — Other Ambulatory Visit: Payer: Self-pay

## 2021-03-03 DIAGNOSIS — Q181 Preauricular sinus and cyst: Secondary | ICD-10-CM | POA: Diagnosis not present

## 2021-03-03 DIAGNOSIS — H903 Sensorineural hearing loss, bilateral: Secondary | ICD-10-CM | POA: Diagnosis not present

## 2021-03-03 NOTE — [facility] Notes (Signed)
HPI: Paul Parks is a 85 y.o. male who presents for evaluation of left ear complaints.  He is scheduled to get a hearing test and needed is here check to make sure they are clear.  He also feels a small nodule in the opening of the left ear canal that he wanted checked.  Past Medical History:  Diagnosis Date  . Allergic rhinitis   . Allergy   . Aortic atherosclerosis ([place])   . Bladder disorder    takes bladder control meds  . BPH (benign prostatic hyperplasia)   . Cataract   . DDD (degenerative disc disease)   . Diverticulosis   . Glaucoma   . Hyperlipemia   . Hyperlipidemia   . Hypertension   . Labyrinthitis   . Personal history of colonic polyps   . Sinus node dysfunction (HCC)   . Skull fracture ([place]) 1955   from [place]   . Thrombocytopenia ([place])   . Tubular adenoma of colon   . Vitamin D deficiency    Past Surgical History:  Procedure Laterality Date  . COLON SURGERY     polyps   . COLONOSCOPY    . EYE SURGERY Bilateral    cataracts   . left knee surgery- microscopic  Left 80's   [facility]   . LUMBAR LAMINECTOMY/DECOMPRESSION MICRODISCECTOMY Right 01/13/2021   Procedure: Laminectomy and Foraminotomy - Lumbar four-Lumbar five - right;  Surgeon: Eustace Moore, MD;  Location: [place];  Service: Neurosurgery;  Laterality: Right;  . 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 TREATMENT FISTULA ANAL     Social History   Socioeconomic History  . Marital status: Married    Spouse name: [name]  . Number of children: 2  . Years of education: Not on file  . Highest education level: Not on file  Occupational History  . 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 Smoking status: Never Smoker  . Smokeless tobacco: Never Used  Vaping Use  . Vaping Use: Never used  Substance and Sexual Activity  . Alcohol use: Not Currently    Alcohol/week: 0.0 standard drinks    Comment:  every 2 months 1-2 beers                            . Drug use: No  . Sexual activity: Yes  Other Topics Concern  . Not on file  Social History Narrative   Lives at home with wife, [name]    Social Determinants of Health   Financial Resource Strain: Not on file  Food Insecurity: Not on file  Transportation Needs: Not on file  Physical Activity: Not on file  Stress: Not on file  Social Connections: Not on file   Family History  Problem Relation Age of Onset  . Heart disease Mother   . Diabetes Mother        diet controlled  . Other Mother        tetanus / lock jaw  . Heart disease Brother   . Emphysema Brother        smoker  . Heart disease Father   . Emphysema Father        smoker  . Glaucoma Father   . Cancer Sister        brain and stomach / colon   .  Emphysema Sister        smoker  . Lumbar disc disease Sister   . Hyperlipidemia Sister   . Hypertension Sister   . Heart disease Sister   . Polycythemia Son   . Heart disease Sister   . Hyperlipidemia Son   . Colon cancer Neg Hx   . Esophageal cancer Neg Hx   . Rectal cancer Neg Hx   . Stomach cancer Neg Hx    Allergies  Allergen Reactions  . Sulfa Antibiotics Itching    Rash   . Ibuprofen Swelling  . Gabapentin   . Vesicare [Solifenacin] Other (See Comments)    HA    Prior to Admission medications   Medication Sig Start Date End Date Taking? Authorizing Provider  alfuzosin (UROXATRAL) 10 MG 24 hr tablet Take 10 mg by mouth daily.  01/08/20   [provider]  amLODipine (NORVASC) 2.5 MG tablet TAKE 1 TABLET BY MOUTH EVERY DAY 02/01/21   Evans Lance, MD  aspirin 81 MG tablet Take 81 mg by mouth daily.    [provider]  cholecalciferol (VITAMIN D) 1000 UNITS tablet Take 1,000 Units by mouth daily.    [provider]  fluticasone (FLONASE) 50 MCG/ACT nasal spray Place 1 spray into both nostrils daily as needed for allergies.    [provider]  furosemide (LASIX) 20 MG  tablet Take 1 tablet (20 mg total) by mouth daily. Patient taking differently: Take 20 mg by mouth 3 (three) times a week. 10/09/20 01/07/21  Baldwin Jamaica, PA-C  HYDROcodone-acetaminophen (NORCO/VICODIN) 5-325 MG tablet Take 1 tablet by mouth every 4 (four) hours as needed for moderate pain. 01/13/21 01/13/22  Eustace Moore, MD  meclizine (ANTIVERT) 12.5 MG tablet Take 12.5 mg by mouth 4 (four) times daily as needed. Take one (1) tablet (12.5 mg) by mouth four (4) times daily with meals and at bedtime for dizziness as needed    [provider]  Multiple Vitamin (MULTIVITAMIN WITH MINERALS) TABS tablet Take 1 tablet by mouth daily.    [provider]  rosuvastatin (CRESTOR) 10 MG tablet TAKE 1 TABLET BY MOUTH 2 TIMES A WEEK Patient taking differently: Take by mouth 2 (two) times a week. 07/09/20   [name], DO  tadalafil (CIALIS) 10 MG tablet Take 1 tablet (10 mg total) by mouth daily as needed for erectile dysfunction. 05/13/20   Gottschalk, Leatrice Jewels M, DO  TRAVATAN Z 0.004 % SOLN ophthalmic solution Place 1 drop into both eyes at bedtime.  11/24/14   [provider]     Positive ROS: Otherwise negative  All other systems have been reviewed and were otherwise negative with the exception of those mentioned in the HPI and as above.  Physical Exam: Constitutional: Alert, well-appearing, no acute distress Ears: External ears without lesions or tenderness. Ear canals are clear bilaterally with minimal wax buildup.  Of note he has some hairs down in the left ear canal that were removed with forceps.  TMs were clear bilaterally.  The small nodule he feels in the opening of the left ear canal represents a small dermal cyst measuring 2 to 3 mm in size.  It is not painful and no erythema noted. Nasal: External nose without lesions.. Clear nasal passages Oral: Lips and gums without lesions. Tongue and palate mucosa without lesions. Posterior oropharynx clear. Neck: No palpable  adenopathy or masses Respiratory: Breathing comfortably  Skin: No facial/neck lesions or rash noted.  Procedures  Assessment:  Left ear canal dermal cyst. Sensorineural hearing loss  Plan: Concerning the small cyst in the left ear canal recommend just cleaning with soap and water and not picking at the cyst.  This is benign and hopefully will disappear spontaneously or not enlarge or cause any infections in the future. He will follow-up as needed.  Radene Journey, MD

## 2021-03-10 ENCOUNTER — Ambulatory Visit (INDEPENDENT_AMBULATORY_CARE_PROVIDER_SITE_OTHER): Payer: Medicare PPO | Admitting: Otolaryngology

## 2021-04-06 ENCOUNTER — Encounter (INDEPENDENT_AMBULATORY_CARE_PROVIDER_SITE_OTHER): Payer: Self-pay

## 2021-04-22 ENCOUNTER — Encounter: Payer: Self-pay | Admitting: Internal Medicine

## 2021-04-22 ENCOUNTER — Other Ambulatory Visit: Payer: Self-pay

## 2021-04-22 ENCOUNTER — Ambulatory Visit: Payer: Medicare PPO | Admitting: Internal Medicine

## 2021-04-22 VITALS — BP 132/68 | HR 42 | Ht 70.0 in | Wt 187.6 lb

## 2021-04-22 DIAGNOSIS — I495 Sick sinus syndrome: Secondary | ICD-10-CM | POA: Diagnosis not present

## 2021-04-22 DIAGNOSIS — I493 Ventricular premature depolarization: Secondary | ICD-10-CM

## 2021-04-22 MED ORDER — MEXILETINE HCL 150 MG PO CAPS: 150.0000 mg | ORAL_CAPSULE | Freq: Two times a day (BID) | ORAL | 3 refills | Status: DC

## 2021-04-22 NOTE — Patient Instructions (Addendum)
Medication Instructions:  Your physician has recommended you make the following change in your medication:   1.  START taking mexiletine 150 mg-  Take one tablet by mouth twice a day   Labwork: None ordered.  Testing/Procedures: None ordered.  Follow-Up: Your physician wants you to follow-up in: 3 months with Paul Peru, MD   August 03, 2021 at 10:30 am at the [facility] office  Any Other Special Instructions Will Be Listed Below (If Applicable).  If you need a refill on your cardiac medications before your next appointment, please call your pharmacy.   Mexiletine capsules What is this medicine? MEXILETINE (mex IL e teen) is an antiarrhythmic agent. This medicine is used to treat irregular heart rhythm and can slow rapid heartbeats. It can help your heart to return to and maintain a normal rhythm. Because of the side effects caused by this medicine, it is usually used for heartbeat problems that may be life-threatening. This medicine may be used for other purposes; ask your health care provider or pharmacist if you have questions. COMMON BRAND NAME(S): Mexitil What should I tell my health care provider before I take this medicine? They need to know if you have any of these conditions:  liver disease  other heart problems  previous heart attack  an unusual or allergic reaction to mexiletine, other medicines, foods, dyes, or preservatives  pregnant or trying to get pregnant  breast-feeding How should I use this medicine? Take this medicine by mouth with a glass of water. Follow the directions on the prescription label. It is recommended that you take this medicine with food or an antacid. Take your doses at regular intervals. Do not take your medicine more often than directed. Do not stop taking except on the advice of your doctor or health care professional. Talk to your pediatrician regarding the use of this medicine in children. Special care may be needed. Overdosage: If  you think you have taken too much of this medicine contact a poison control center or emergency room at once. NOTE: This medicine is only for you. Do not share this medicine with others. What if I miss a dose? If you miss a dose, take it as soon as you can. If it is almost time for your next dose, take only that dose. Do not take double or extra doses. What may interact with this medicine? Do not take this medicine with any of the following medications:  dofetilide This medicine may also interact with the following medications:  caffeine  cimetidine  medicines for depression, anxiety, or psychotic disturbances  medicines to control heart rhythm  phenobarbital  phenytoin  rifampin  theophylline This list may not describe all possible interactions. Give your health care provider a list of all the medicines, herbs, non-prescription drugs, or dietary supplements you use. Also tell them if you smoke, drink alcohol, or use illegal drugs. Some items may interact with your medicine. What should I watch for while using this medicine? Your condition will be monitored closely when you first begin therapy. Often, this drug is first started in a hospital or other monitored health care setting. Once you are on maintenance therapy, visit your doctor or health care provider for regular checks on your progress. Because your condition and use of this medicine carry some risk, it is a good idea to carry an identification card, necklace or bracelet with details of your condition, medications, and doctor or health care provider. You may get drowsy or dizzy. Do not  drive, use machinery, or do anything that needs mental alertness until you know how this medicine affects you. Do not stand or sit up quickly, especially if you are an older patient. This reduces the risk of dizzy or fainting spells. Alcohol can make you more dizzy, increase flushing and rapid heartbeats. Avoid alcoholic drinks. This medicine may  cause serious skin reactions. They can happen weeks to months after starting the medicine. Contact your health care provider right away if you notice fevers or flu-like symptoms with a rash. The rash may be red or purple and then turn into blisters or peeling of the skin. Or, you might notice a red rash with swelling of the face, lips or lymph nodes in your neck or under your arms. What side effects may I notice from receiving this medicine? Side effects that you should report to your doctor or health care professional as soon as possible:  allergic reactions like skin rash, itching or hives, swelling of the face, lips, or tongue  breathing problems  chest pain, continued irregular heartbeats  rash, fever, and swollen lymph nodes  redness, blistering, peeling or loosening of the skin, including inside the mouth  seizures  skin rash  trembling, shaking  unusual bleeding or bruising  unusually weak or tired Side effects that usually do not require medical attention (report to your doctor or health care professional if they continue or are bothersome):  blurred vision  difficulty walking  heartburn  nausea, vomiting  nervousness  numbness, or tingling in the fingers or toes This list may not describe all possible side effects. Call your doctor for medical advice about side effects. You may report side effects to FDA at 1-800-FDA-1088. Where should I keep my medicine? Keep out of reach of children. Store at room temperature between 15 and 30 degrees C (59 and 86 degrees F). Throw away any unused medicine after the expiration date. NOTE: This sheet is a summary. It may not cover all possible information. If you have questions about this medicine, talk to your doctor, pharmacist, or health care provider.  2021 Elsevier/Gold Standard (2019-02-27 09:25:42)

## 2021-04-22 NOTE — [facility] Notes (Signed)
HPI Mr. Bogan returns today for ongoing evaluation of PVC's, heart block, sinus node dysfunction and fatigue. He c/o having very poor energy. He has not had syncope. He notes generalized weakness. He wore a cardia monitor with demonstrated multifocal PVC's and transient CHB as well as transient sinus node dysfunction. He was initially not inclined to pursue PPM. He has undergon lower back surgery and his back is improved with much less pain.  Allergies  Allergen Reactions  . Sulfa Antibiotics Itching    Rash   . Ibuprofen Swelling  . Gabapentin   . Vesicare [Solifenacin] Other (See Comments)    HA      Current Outpatient Medications  Medication Sig Dispense Refill  . alfuzosin (UROXATRAL) 10 MG 24 hr tablet Take 10 mg by mouth daily.     Marland Kitchen amLODipine (NORVASC) 2.5 MG tablet TAKE 1 TABLET BY MOUTH EVERY DAY 90 tablet 2  . aspirin 81 MG tablet Take 81 mg by mouth daily.    . cholecalciferol (VITAMIN D) 1000 UNITS tablet Take 1,000 Units by mouth daily.    . fluticasone (FLONASE) 50 MCG/ACT nasal spray Place 1 spray into both nostrils daily as needed for allergies.    Marland Kitchen HYDROcodone-acetaminophen (NORCO/VICODIN) 5-325 MG tablet Take 1 tablet by mouth every 4 (four) hours as needed for moderate pain. 20 tablet 0  . meclizine (ANTIVERT) 12.5 MG tablet Take 12.5 mg by mouth 4 (four) times daily as needed. Take one (1) tablet (12.5 mg) by mouth four (4) times daily with meals and at bedtime for dizziness as needed    . Multiple Vitamin (MULTIVITAMIN WITH MINERALS) TABS tablet Take 1 tablet by mouth daily.    . rosuvastatin (CRESTOR) 10 MG tablet TAKE 1 TABLET BY MOUTH 2 TIMES A WEEK 12 tablet 1  . tadalafil (CIALIS) 10 MG tablet Take 1 tablet (10 mg total) by mouth daily as needed for erectile dysfunction. 10 tablet 12  . TRAVATAN Z 0.004 % SOLN ophthalmic solution Place 1 drop into both eyes at bedtime.   11  . furosemide (LASIX) 20 MG tablet Take 1 tablet (20 mg total) by mouth  daily. (Patient taking differently: Take 20 mg by mouth 3 (three) times a week.) 90 tablet 3   No current facility-administered medications for this visit.     Past Medical History:  Diagnosis Date  . Allergic rhinitis   . Allergy   . Aortic atherosclerosis ([place])   . Bladder disorder    takes bladder control meds  . BPH (benign prostatic hyperplasia)   . Cataract   . DDD (degenerative disc disease)   . Diverticulosis   . Glaucoma   . Hyperlipemia   . Hyperlipidemia   . Hypertension   . Labyrinthitis   . Personal history of colonic polyps   . Sinus node dysfunction (HCC)   . Skull fracture ([place]) 1955   from [place]   . Thrombocytopenia ([place])   . Tubular adenoma of colon   . Vitamin D deficiency     ROS:   All systems reviewed and negative except as noted in the HPI.   Past Surgical History:  Procedure Laterality Date  . COLON SURGERY     polyps   . COLONOSCOPY    . EYE SURGERY Bilateral    cataracts   . left knee surgery- microscopic  Left 80's   [facility]   . LUMBAR LAMINECTOMY/DECOMPRESSION MICRODISCECTOMY Right 01/13/2021   Procedure: Laminectomy and Foraminotomy - Lumbar  four-Lumbar five - right;  Surgeon: Eustace Moore, MD;  Location: [place];  Service: Neurosurgery;  Laterality: Right;  . 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 TREATMENT FISTULA ANAL       Family History  Problem Relation Age of Onset  . Heart disease Mother   . Diabetes Mother        diet controlled  . Other Mother        tetanus / lock jaw  . Heart disease Brother   . Emphysema Brother        smoker  . Heart disease Father   . Emphysema Father        smoker  . Glaucoma Father   . Cancer Sister        brain and stomach / colon   . Emphysema Sister        smoker  . Lumbar disc disease Sister   . Hyperlipidemia Sister   . Hypertension Sister   . Heart disease Sister   . Polycythemia Son   . Heart disease Sister   . Hyperlipidemia  Son   . Colon cancer Neg Hx   . Esophageal cancer Neg Hx   . Rectal cancer Neg Hx   . Stomach cancer Neg Hx      Social History   Socioeconomic History  . Marital status: Married    Spouse name: [name]  . Number of children: 2  . Years of education: Not on file  . Highest education level: Not on file  Occupational History  . 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 Smoking status: Never Smoker  . Smokeless tobacco: Never Used  Vaping Use  . Vaping Use: Never used  Substance and Sexual Activity  . Alcohol use: Not Currently    Alcohol/week: 0.0 standard drinks    Comment: every 2 months 1-2 beers                            . Drug use: No  . Sexual activity: Yes  Other Topics Concern  . Not on file  Social History Narrative   Lives at home with wife, [name]    Social Determinants of Health   Financial Resource Strain: Not on file  Food Insecurity: Not on file  Transportation Needs: Not on file  Physical Activity: Not on file  Stress: Not on file  Social Connections: Not on file  Intimate Partner Violence: Not on file     BP 132/68   Pulse (!) 42   Ht 5\' 10"  (1.778 m)   Wt 187 lb 9.6 oz (85.1 kg)   SpO2 96%   BMI 26.92 kg/m   Physical Exam:  Well appearing NAD HEENT: Unremarkable Neck:  No JVD, no thyromegally Lymphatics:  No adenopathy Back:  No CVA tenderness Lungs:  Clear with no wheezes HEART:  IRegular rate rhythm, no murmurs, no rubs, no clicks Abd:  soft, positive bowel sounds, no organomegally, no rebound, no guarding Ext:  2 plus pulses, trace edema, no cyanosis, no clubbing Skin:  No rashes no nodules  A/P 1. Sinus node dysfunction/CHB -he is symptomatic. I recommended PPM insertion but he is not interested at this time.  2. PVC's - he has multifocal PVC's. I discussed AA drug therapy. His only option is mexilitine. We will  start 150 bid and uptitrate as tolerated. 3. HTN - his bp is  well controlled.  4. Periopheral edema. - he is encouraged to maintain a low sodium diet.   Carleene Overlie Klea Nall,MD

## 2021-04-26 DIAGNOSIS — H401112 Primary open-angle glaucoma, right eye, moderate stage: Secondary | ICD-10-CM | POA: Diagnosis not present

## 2021-04-26 DIAGNOSIS — Z79899 Other long term (current) drug therapy: Secondary | ICD-10-CM | POA: Diagnosis not present

## 2021-04-26 DIAGNOSIS — H401123 Primary open-angle glaucoma, left eye, severe stage: Secondary | ICD-10-CM | POA: Diagnosis not present

## 2021-05-14 ENCOUNTER — Other Ambulatory Visit: Payer: Self-pay

## 2021-05-14 ENCOUNTER — Encounter: Payer: Self-pay | Admitting: Family Medicine

## 2021-05-14 ENCOUNTER — Ambulatory Visit: Payer: Medicare PPO | Admitting: Family Medicine

## 2021-05-14 VITALS — BP 118/58 | HR 67 | Temp 97.6°F | Ht 70.0 in | Wt 183.2 lb

## 2021-05-14 DIAGNOSIS — I10 Essential (primary) hypertension: Secondary | ICD-10-CM | POA: Diagnosis not present

## 2021-05-14 DIAGNOSIS — L819 Disorder of pigmentation, unspecified: Secondary | ICD-10-CM | POA: Diagnosis not present

## 2021-05-14 DIAGNOSIS — I700 Atherosclerosis of aorta: Secondary | ICD-10-CM | POA: Diagnosis not present

## 2021-05-14 DIAGNOSIS — D696 Thrombocytopenia, unspecified: Secondary | ICD-10-CM | POA: Diagnosis not present

## 2021-05-14 DIAGNOSIS — Z1211 Encounter for screening for malignant neoplasm of colon: Secondary | ICD-10-CM

## 2021-05-14 LAB — CBC
Hematocrit: 41 % (ref 37.5–51.0)
Hemoglobin: 14.2 g/dL (ref 13.0–17.7)
MCH: 31.2 pg (ref 26.6–33.0)
MCHC: 34.6 g/dL (ref 31.5–35.7)
MCV: 90 fL (ref 79–97)
Platelets: 133 10*3/uL — ABNORMAL LOW (ref 150–450)
RBC: 4.55 x10E6/uL (ref 4.14–5.80)
RDW: 12.8 % (ref 11.6–15.4)
WBC: 5.4 10*3/uL (ref 3.4–10.8)

## 2021-05-14 LAB — LIPID PANEL
Chol/HDL Ratio: 2.5 ratio (ref 0.0–5.0)
Cholesterol, Total: 178 mg/dL (ref 100–199)
HDL: 70 mg/dL (ref >39)
LDL Chol Calc (NIH): 97 mg/dL (ref 0–99)
Triglycerides: 56 mg/dL (ref 0–149)
VLDL Cholesterol Cal: 11 mg/dL (ref 5–40)

## 2021-05-14 LAB — CMP14+EGFR
ALT: 12 IU/L (ref 0–44)
AST: 28 IU/L (ref 0–40)
Albumin/Globulin Ratio: 2.4 — ABNORMAL HIGH (ref 1.2–2.2)
Albumin: 4.6 g/dL (ref 3.6–4.6)
Alkaline Phosphatase: 52 IU/L (ref 44–121)
BUN/Creatinine Ratio: 17 (ref 10–24)
BUN: 21 mg/dL (ref 8–27)
Bilirubin Total: 0.9 mg/dL (ref 0.0–1.2)
CO2: 24 mmol/L (ref 20–29)
Calcium: 9.3 mg/dL (ref 8.6–10.2)
Chloride: 98 mmol/L (ref 96–106)
Creatinine, Ser: 1.26 mg/dL (ref 0.76–1.27)
Globulin, Total: 1.9 g/dL (ref 1.5–4.5)
Glucose: 92 mg/dL (ref 65–99)
Potassium: 4.3 mmol/L (ref 3.5–5.2)
Sodium: 138 mmol/L (ref 134–144)
Total Protein: 6.5 g/dL (ref 6.0–8.5)
eGFR: 57 mL/min/{1.73_m2} — ABNORMAL LOW (ref >59)

## 2021-05-14 MED ORDER — ROSUVASTATIN CALCIUM 10 MG PO TABS
ORAL_TABLET | ORAL | 1 refills | Status: DC
Start: 2021-05-14 — End: 2023-05-12

## 2021-05-14 NOTE — Progress Notes (Signed)
Subjective: CC: 26-monthfollow-up PCP: GJanora Norlander DO HRVU:YEBXIWMciveris a 85y.o. male presenting to clinic today for:  1.  Hypertension, hyperlipidemia, palpitations Patient is compliant with his Crestor and amlodipine.  Mexitil was recently added, which apparently does not affect heart rate but should help with intermittent PVCs.  Apparently there were discussions about potential pacemaker placement but he is not yet ready to pursue anything like that.  2.  Skin lesion Patient reports a skin lesion on the right side of his scalp.  He does feel like it is getting slightly larger.  He has an appointment with his dermatologist later in the month but thought that he would mention this.  Does not report any easy bleeding.  3.  Colon cancer screening Patient would like to proceed with colon cancer screening with FOBT.  He denies any hematochezia or melena.  No unplanned weight loss  ROS: Per HPI  Allergies  Allergen Reactions   Sulfa Antibiotics Itching    Rash    Ibuprofen Swelling   Gabapentin    Vesicare [Solifenacin] Other (See Comments)    HA    Past Medical History:  Diagnosis Date   Allergic rhinitis    Allergy    Aortic atherosclerosis (HCC)    Bladder disorder    takes bladder control meds   BPH (benign prostatic hyperplasia)    Cataract    DDD (degenerative disc disease)    Diverticulosis    Glaucoma    Hyperlipemia    Hyperlipidemia    Hypertension    Labyrinthitis    Personal history of colonic polyps    Sinus node dysfunction ([place]    [place]   from [place]    Thrombocytopenia (HCC)    Tubular adenoma of colon    Vitamin D deficiency     Current Outpatient Medications:    alfuzosin (UROXATRAL) 10 MG 24 hr tablet, Take 10 mg by mouth daily. , Disp: , Rfl:    amLODipine (NORVASC) 2.5 MG tablet, TAKE 1 TABLET BY MOUTH EVERY DAY, Disp: 90 tablet, Rfl: 2   aspirin 81 MG tablet, Take 81 mg by mouth daily., Disp: , Rfl:     cholecalciferol (VITAMIN D) 1000 UNITS tablet, Take 1,000 Units by mouth daily., Disp: , Rfl:    fluticasone (FLONASE) 50 MCG/ACT nasal spray, Place 1 spray into both nostrils daily as needed for allergies., Disp: , Rfl:    furosemide (LASIX) 20 MG tablet, Take 1 tablet (20 mg total) by mouth daily. (Patient taking differently: Take 20 mg by mouth 3 (three) times a week.), Disp: 90 tablet, Rfl: 3   HYDROcodone-acetaminophen (NORCO/VICODIN) 5-325 MG tablet, Take 1 tablet by mouth every 4 (four) hours as needed for moderate pain., Disp: 20 tablet, Rfl: 0   meclizine (ANTIVERT) 12.5 MG tablet, Take 12.5 mg by mouth 4 (four) times daily as needed. Take one (1) tablet (12.5 mg) by mouth four (4) times daily with meals and at bedtime for dizziness as needed, Disp: , Rfl:    mexiletine (MEXITIL) 150 MG capsule, Take 1 capsule (150 mg total) by mouth 2 (two) times daily., Disp: 180 capsule, Rfl: 3   Multiple Vitamin (MULTIVITAMIN WITH MINERALS) TABS tablet, Take 1 tablet by mouth daily., Disp: , Rfl:    rosuvastatin (CRESTOR) 10 MG tablet, TAKE 1 TABLET BY MOUTH 2 TIMES A WEEK, Disp: 12 tablet, Rfl: 1   tadalafil (CIALIS) 10 MG tablet, Take 1 tablet (10 mg total) by  mouth daily as needed for erectile dysfunction., Disp: 10 tablet, Rfl: 12   TRAVATAN Z 0.004 % SOLN ophthalmic solution, Place 1 drop into both eyes at bedtime. , Disp: , Rfl: 11 Social History   Socioeconomic History   Marital status: Married    Spouse name: [name]   Number of children: 2   Years of education: Not on file   Highest education level: Not on file  Occupational History   Occupation: retired     Fish farm manager: [employer]    Comment: education   Tobacco Use   Smoking status: Never   Smokeless tobacco: Never  Vaping Use   Vaping Use: Never used  Substance and Sexual Activity   Alcohol use: Not Currently    Alcohol/week: 0.0 standard drinks    Comment: every 2 months 1-2 beers                             Drug  use: No   Sexual activity: Yes  Other Topics Concern   Not on file  Social History Narrative   Lives at home with wife, Barista    Social Determinants of Health   Financial Resource Strain: Not on file  Food Insecurity: Not on file  Transportation Needs: Not on file  Physical Activity: Not on file  Stress: Not on file  Social Connections: Not on file  Intimate Partner Violence: Not on file   Family History  Problem Relation Age of Onset   Heart disease Mother    Diabetes Mother        diet controlled   Other Mother        tetanus / lock jaw   Heart disease Brother    Emphysema Brother        smoker   Heart disease Father    Emphysema Father        smoker   Glaucoma Father    Cancer Sister        brain and stomach / colon    Emphysema Sister        smoker   Lumbar disc disease Sister    Hyperlipidemia Sister    Hypertension Sister    Heart disease Sister    Polycythemia Son    Heart disease Sister    Hyperlipidemia Son    Colon cancer Neg Hx    Esophageal cancer Neg Hx    Rectal cancer Neg Hx    Stomach cancer Neg Hx     Objective: Office vital signs reviewed. BP (!) 118/58   Pulse 67   Temp 97.6 F (36.4 C) (Temporal)   Ht 5' 10"  (1.778 m)   Wt 183 lb 3.2 oz (83.1 kg)   SpO2 96%   BMI 26.29 kg/m   Physical Examination:  General: Awake, alert, well nourished, No acute distress HEENT: Normal; sclera white.  No carotid bruits Cardio: regular rate and rhythm, S1S2 heard, no murmurs appreciated Pulm: clear to auscultation bilaterally, no wheezes, rhonchi or rales; normal work of breathing on room air GI: soft, non-tender, non-distended, bowel sounds present x4, no hepatomegaly, no splenomegaly, no masses Extremities: warm, well perfused, No edema, cyanosis or clubbing; +2 pulses bilaterally MSK: Ambulating independently Skin: Eraser sized pigmented lesion with irregular borders noted along the right temporal area within the scalp.  There is no central  umbilication or appreciable hypervascularity.  Assessment/ Plan: 85 y.o. male   Essential hypertension - Plan: CMP14+EGFR  Aortic  atherosclerosis ([place]) - Plan: CMP14+EGFR, Lipid Panel  Thrombocytopenia ([place]) - Plan: CBC  Screen for colon cancer - Plan: Fecal occult blood, imunochemical  Pigmented skin lesion  Blood pressure under good control.  Continue current regimen  Check CMP, fasting lipid panel  Check CBC given history of thrombocytopenia  FOBT provided.  He will return at his earliest convenience  Pigmented skin lesion clinically appears to be a seborrheic keratosis.  However, given that it is pigmented and enlarging I have recommended that he follow-up with dermatology for consideration of evaluation under dermatoscope.  No orders of the defined types were placed in this encounter.  No orders of the defined types were placed in this encounter.    [name], DO [place] [phone]

## 2021-05-31 DIAGNOSIS — R351 Nocturia: Secondary | ICD-10-CM | POA: Diagnosis not present

## 2021-05-31 DIAGNOSIS — N3941 Urge incontinence: Secondary | ICD-10-CM | POA: Diagnosis not present

## 2021-05-31 DIAGNOSIS — R3912 Poor urinary stream: Secondary | ICD-10-CM | POA: Diagnosis not present

## 2021-05-31 DIAGNOSIS — N401 Enlarged prostate with lower urinary tract symptoms: Secondary | ICD-10-CM | POA: Diagnosis not present

## 2021-08-03 ENCOUNTER — Other Ambulatory Visit: Payer: Self-pay

## 2021-08-03 ENCOUNTER — Ambulatory Visit: Payer: Medicare PPO | Admitting: Internal Medicine

## 2021-08-03 VITALS — BP 142/48 | HR 64 | Ht 70.0 in | Wt 183.2 lb

## 2021-08-03 DIAGNOSIS — I441 Atrioventricular block, second degree: Secondary | ICD-10-CM | POA: Diagnosis not present

## 2021-08-03 DIAGNOSIS — I493 Ventricular premature depolarization: Secondary | ICD-10-CM
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
## 2021-08-03 NOTE — Patient Instructions (Signed)
Medication Instructions:  NO CHANGES *If you need a refill on your cardiac medications before your next appointment, please call your pharmacy*   Lab Work: CBC ,BMET AND INR If you have labs (blood work) drawn today and your tests are completely normal, you will receive your results only by: [place] (if you have MyChart) OR A paper copy in the mail If you have any lab test that is abnormal or we need to change your treatment, we will call you to review the results.   Testing/Procedures: NONE   Follow-Up: At [facility], you and your health needs are our priority.  As part of our continuing mission to provide you with exceptional heart care, we have created designated Provider Care Teams.  These Care Teams include your primary Cardiologist (physician) and Advanced Practice Providers (APPs -  Physician Assistants and Nurse Practitioners) who all work together to provide you with the care you need, when you need it.  We recommend signing up for the patient portal called "MyChart".  Sign up information is provided on this After Visit Summary.  MyChart is used to connect with patients for Virtual Visits (Telemedicine).  Patients are able to view lab/test results, encounter notes, upcoming appointments, etc.  Non-urgent messages can be sent to your provider as well.   To learn more about what you can do with MyChart, go to [email or web address].    Your next appointment:  PT TO CALL TO SCHEDULE ABLATION  PVC AND POSSIBLE PPM IMPLANT  The format for your next appointment:     Provider:      Other Instructions

## 2021-08-04 ENCOUNTER — Telehealth: Payer: Self-pay | Admitting: Internal Medicine

## 2021-08-04 DIAGNOSIS — Z79899 Other long term (current) drug therapy: Secondary | ICD-10-CM | POA: Diagnosis not present

## 2021-08-04 DIAGNOSIS — I493 Ventricular premature depolarization: Secondary | ICD-10-CM

## 2021-08-04 DIAGNOSIS — H401112 Primary open-angle glaucoma, right eye, moderate stage: Secondary | ICD-10-CM | POA: Diagnosis not present

## 2021-08-04 DIAGNOSIS — H401123 Primary open-angle glaucoma, left eye, severe stage: Secondary | ICD-10-CM | POA: Diagnosis not present

## 2021-08-04 NOTE — Telephone Encounter (Signed)
Follow up:   Patient calling back. Patient have not gave nurse time to call back. Please advise

## 2021-08-04 NOTE — Telephone Encounter (Signed)
Patient is calling to schedule a procedure

## 2021-08-04 NOTE — Telephone Encounter (Signed)
Returned call to Pt.  Spoke with wife.  Will hold August 19, 2021 at 7:30 am for Pt  Need to confirm anesthesia/carto? +/- pacemaker?

## 2021-08-06 ENCOUNTER — Other Ambulatory Visit: Payer: Self-pay | Admitting: Family Medicine

## 2021-08-06 NOTE — Telephone Encounter (Signed)
Pt scheduled for PVC ablation with carto/anesthesia 08/19/2021  Labs 08/11/2021  Work up complete     A.    Labs-([address]) You will come to the [facility] [employer] on 08/11/2021 any time between 8:00 am and 4:30 pm.  You do NOT need to be fasting.    2.  PROCEDURE DAY:  On the day of your procedure 08/19/2021 at 5:30 am you will go to [facility] ([address]  You will go to the main entrance A [employer]) and enter where the [employer] parking staff are.  You will check in at ADMITTING.  You may have one support person come into the hospital with you.  They will be asked to wait in the waiting room.  It is OK to have someone drop you off and come back when you are ready to be discharged.     3.   Do not eat or drink after midnight prior to your procedure.     4.   On the morning of your procedure do NOT take any medication.     Pt has discontinued mexiletine.   5.  Plan for an overnight stay but you may be discharged after your procedure.  If you are discharged after your procedure you will need someone to drive you home and be with your for 24 hours after your procedure.   6.  You will follow up with Dr. Lovena Le 4 weeks after your procedure.

## 2021-08-11 ENCOUNTER — Other Ambulatory Visit: Payer: Medicare PPO

## 2021-08-11 ENCOUNTER — Other Ambulatory Visit: Payer: Self-pay

## 2021-08-11 DIAGNOSIS — I493 Ventricular premature depolarization: Secondary | ICD-10-CM | POA: Diagnosis not present

## 2021-08-11 LAB — CBC WITH DIFFERENTIAL/PLATELET
Basophils Absolute: 0 10*3/uL (ref 0.0–0.2)
Basos: 0 %
EOS (ABSOLUTE): 0.1 10*3/uL (ref 0.0–0.4)
Eos: 2 %
Hematocrit: 37.8 % (ref 37.5–51.0)
Hemoglobin: 13.1 g/dL (ref 13.0–17.7)
Lymphocytes Absolute: 1.8 10*3/uL (ref 0.7–3.1)
Lymphs: 33 %
MCH: 31.8 pg (ref 26.6–33.0)
MCHC: 34.7 g/dL (ref 31.5–35.7)
MCV: 92 fL (ref 79–97)
Monocytes Absolute: 0.4 10*3/uL (ref 0.1–0.9)
Monocytes: 7 %
Neutrophils Absolute: 3.2 10*3/uL (ref 1.4–7.0)
Neutrophils: 58 %
Platelets: 124 10*3/uL — ABNORMAL LOW (ref 150–450)
RBC: 4.12 x10E6/uL — ABNORMAL LOW (ref 4.14–5.80)
RDW: 13.5 % (ref 11.6–15.4)
WBC: 5.6 10*3/uL (ref 3.4–10.8)

## 2021-08-11 LAB — BASIC METABOLIC PANEL
BUN/Creatinine Ratio: 15 (ref 10–24)
BUN: 17 mg/dL (ref 8–27)
CO2: 29 mmol/L (ref 20–29)
Calcium: 9.3 mg/dL (ref 8.6–10.2)
Chloride: 100 mmol/L (ref 96–106)
Creatinine, Ser: 1.15 mg/dL (ref 0.76–1.27)
Glucose: 97 mg/dL (ref 65–99)
Potassium: 4.7 mmol/L (ref 3.5–5.2)
Sodium: 133 mmol/L — ABNORMAL LOW (ref 134–144)
eGFR: 63 mL/min/{1.73_m2} (ref >59)

## 2021-08-12 DIAGNOSIS — L570 Actinic keratosis: Secondary | ICD-10-CM | POA: Diagnosis not present

## 2021-08-12 DIAGNOSIS — L812 Freckles: Secondary | ICD-10-CM | POA: Diagnosis not present

## 2021-08-12 DIAGNOSIS — L578 Other skin changes due to chronic exposure to nonionizing radiation: Secondary | ICD-10-CM | POA: Diagnosis not present

## 2021-08-12 DIAGNOSIS — Z1283 Encounter for screening for malignant neoplasm of skin: Secondary | ICD-10-CM | POA: Diagnosis not present

## 2021-08-12 DIAGNOSIS — Z85828 Personal history of other malignant neoplasm of skin: Secondary | ICD-10-CM | POA: Diagnosis not present

## 2021-08-12 DIAGNOSIS — C44319 Basal cell carcinoma of skin of other parts of face: Secondary | ICD-10-CM | POA: Diagnosis not present

## 2021-08-12 DIAGNOSIS — D1801 Hemangioma of skin and subcutaneous tissue: Secondary | ICD-10-CM | POA: Diagnosis not present

## 2021-08-12 DIAGNOSIS — L821 Other seborrheic keratosis: Secondary | ICD-10-CM | POA: Diagnosis not present

## 2021-08-12 DIAGNOSIS — Z85820 Personal history of malignant melanoma of skin: Secondary | ICD-10-CM | POA: Diagnosis not present

## 2021-08-18 NOTE — Pre-Procedure Instructions (Signed)
Instructed patient on the following items: °Arrival time 0530 °Nothing to eat or drink after midnight °No meds AM of procedure °Responsible person to drive you home and stay with you for 24 hrs ° ° °   °

## 2021-08-19 ENCOUNTER — Encounter (HOSPITAL_COMMUNITY)
Admission: RE | Disposition: A | Discharge status: Home / Self Care | Payer: Self-pay | Source: Home / Self Care | Attending: Internal Medicine

## 2021-08-19 ENCOUNTER — Ambulatory Visit (HOSPITAL_COMMUNITY): Payer: Medicare PPO | Admitting: Certified Registered Nurse Anesthetist

## 2021-08-19 ENCOUNTER — Inpatient Hospital Stay (HOSPITAL_COMMUNITY)
Admission: RE | Admit: 2021-08-19 | Discharge: 2021-08-20 | DRG: 274 | Disposition: A | Discharge status: Home / Self Care | Payer: Medicare PPO | Attending: Internal Medicine | Admitting: Internal Medicine

## 2021-08-19 ENCOUNTER — Other Ambulatory Visit: Payer: Self-pay

## 2021-08-19 ENCOUNTER — Inpatient Hospital Stay (HOSPITAL_COMMUNITY): Payer: Medicare PPO

## 2021-08-19 DIAGNOSIS — Z83511 Family history of glaucoma: Secondary | ICD-10-CM

## 2021-08-19 DIAGNOSIS — I495 Sick sinus syndrome: Secondary | ICD-10-CM | POA: Diagnosis present

## 2021-08-19 DIAGNOSIS — Z85828 Personal history of other malignant neoplasm of skin: Secondary | ICD-10-CM | POA: Diagnosis not present

## 2021-08-19 DIAGNOSIS — I493 Ventricular premature depolarization: Principal | ICD-10-CM | POA: Diagnosis present

## 2021-08-19 DIAGNOSIS — Y838 Other surgical procedures as the cause of abnormal reaction of the patient, or of later complication, without mention of misadventure at the time of the procedure: Secondary | ICD-10-CM | POA: Diagnosis not present

## 2021-08-19 DIAGNOSIS — N50819 Testicular pain, unspecified: Secondary | ICD-10-CM | POA: Diagnosis not present

## 2021-08-19 DIAGNOSIS — Z800 Family history of malignant neoplasm of digestive organs: Secondary | ICD-10-CM

## 2021-08-19 DIAGNOSIS — Z86010 Personal history of colonic polyps: Secondary | ICD-10-CM

## 2021-08-19 DIAGNOSIS — Z825 Family history of asthma and other chronic lower respiratory diseases: Secondary | ICD-10-CM | POA: Diagnosis not present

## 2021-08-19 DIAGNOSIS — Z888 Allergy status to other drugs, medicaments and biological substances status: Secondary | ICD-10-CM | POA: Diagnosis not present

## 2021-08-19 DIAGNOSIS — Z8249 Family history of ischemic heart disease and other diseases of the circulatory system: Secondary | ICD-10-CM

## 2021-08-19 DIAGNOSIS — Z79899 Other long term (current) drug therapy: Secondary | ICD-10-CM | POA: Diagnosis not present

## 2021-08-19 DIAGNOSIS — Z8349 Family history of other endocrine, nutritional and metabolic diseases: Secondary | ICD-10-CM | POA: Diagnosis not present

## 2021-08-19 DIAGNOSIS — N400 Enlarged prostate without lower urinary tract symptoms: Secondary | ICD-10-CM | POA: Diagnosis not present

## 2021-08-19 DIAGNOSIS — M549 Dorsalgia, unspecified: Secondary | ICD-10-CM | POA: Diagnosis present

## 2021-08-19 DIAGNOSIS — G8929 Other chronic pain: Secondary | ICD-10-CM | POA: Diagnosis present

## 2021-08-19 DIAGNOSIS — E785 Hyperlipidemia, unspecified: Secondary | ICD-10-CM | POA: Diagnosis present

## 2021-08-19 DIAGNOSIS — I10 Essential (primary) hypertension: Secondary | ICD-10-CM | POA: Diagnosis not present

## 2021-08-19 DIAGNOSIS — I97618 Postprocedural hemorrhage and hematoma of a circulatory system organ or structure following other circulatory system procedure: Secondary | ICD-10-CM | POA: Diagnosis not present

## 2021-08-19 DIAGNOSIS — Z7982 Long term (current) use of aspirin: Secondary | ICD-10-CM | POA: Diagnosis not present

## 2021-08-19 DIAGNOSIS — N5089 Other specified disorders of the male genital organs: Secondary | ICD-10-CM | POA: Diagnosis not present

## 2021-08-19 DIAGNOSIS — Z833 Family history of diabetes mellitus: Secondary | ICD-10-CM | POA: Diagnosis not present

## 2021-08-19 DIAGNOSIS — Z882 Allergy status to sulfonamides status: Secondary | ICD-10-CM | POA: Diagnosis not present

## 2021-08-19 DIAGNOSIS — I9589 Other hypotension: Secondary | ICD-10-CM | POA: Diagnosis not present

## 2021-08-19 DIAGNOSIS — Z20822 Contact with and (suspected) exposure to covid-19: Secondary | ICD-10-CM | POA: Diagnosis present

## 2021-08-19 DIAGNOSIS — D62 Acute posthemorrhagic anemia: Secondary | ICD-10-CM | POA: Diagnosis not present

## 2021-08-19 DIAGNOSIS — R58 Hemorrhage, not elsewhere classified: Secondary | ICD-10-CM | POA: Diagnosis present

## 2021-08-19 DIAGNOSIS — I700 Atherosclerosis of aorta: Secondary | ICD-10-CM | POA: Diagnosis not present

## 2021-08-19 DIAGNOSIS — K5730 Diverticulosis of large intestine without perforation or abscess without bleeding: Secondary | ICD-10-CM | POA: Diagnosis not present

## 2021-08-19 HISTORY — PX: PVC ABLATION: EP1236

## 2021-08-19 LAB — BASIC METABOLIC PANEL
Anion gap: 6 (ref 5–15)
BUN: 15 mg/dL (ref 8–23)
CO2: 25 mmol/L (ref 22–32)
Calcium: 8.2 mg/dL — ABNORMAL LOW (ref 8.9–10.3)
Chloride: 105 mmol/L (ref 98–111)
Creatinine, Ser: 1.18 mg/dL (ref 0.61–1.24)
GFR, Estimated: >60 mL/min (ref >60)
Glucose, Bld: 112 mg/dL — ABNORMAL HIGH (ref 70–99)
Potassium: 4.6 mmol/L (ref 3.5–5.1)
Sodium: 136 mmol/L (ref 135–145)

## 2021-08-19 LAB — CBC
HCT: 17.1 % — ABNORMAL LOW (ref 39.0–52.0)
HCT: 35.2 % — ABNORMAL LOW (ref 39.0–52.0)
Hemoglobin: 5.6 g/dL — CL (ref 13.0–17.0)
Hemoglobin: 11.8 g/dL — ABNORMAL LOW (ref 13.0–17.0)
MCH: 32 pg (ref 26.0–34.0)
MCH: 31 pg (ref 26.0–34.0)
MCHC: 32.7 g/dL (ref 30.0–36.0)
MCHC: 33.5 g/dL (ref 30.0–36.0)
MCV: 97.7 fL (ref 80.0–100.0)
MCV: 92.4 fL (ref 80.0–100.0)
Platelets: 55 10*3/uL — ABNORMAL LOW (ref 150–400)
Platelets: 126 10*3/uL — ABNORMAL LOW (ref 150–400)
RBC: 1.75 MIL/uL — ABNORMAL LOW (ref 4.22–5.81)
RBC: 3.81 MIL/uL — ABNORMAL LOW (ref 4.22–5.81)
RDW: 12.4 % (ref 11.5–15.5)
RDW: 13.7 % (ref 11.5–15.5)
WBC: 4.6 10*3/uL (ref 4.0–10.5)
WBC: 8.7 10*3/uL (ref 4.0–10.5)
nRBC: 0 % (ref 0.0–0.2)
nRBC: 0 % (ref 0.0–0.2)

## 2021-08-19 LAB — ABO/RH: ABO/RH(D): A POS

## 2021-08-19 LAB — POCT I-STAT EG7
Acid-base deficit: 11 mmol/L — ABNORMAL HIGH (ref 0.0–2.0)
Bicarbonate: 14.9 mmol/L — ABNORMAL LOW (ref 20.0–28.0)
Calcium, Ion: 0.81 mmol/L — CL (ref 1.15–1.40)
HCT: 16 % — ABNORMAL LOW (ref 39.0–52.0)
Hemoglobin: 5.4 g/dL — CL (ref 13.0–17.0)
O2 Saturation: 35 %
Potassium: 2.6 mmol/L — CL (ref 3.5–5.1)
Sodium: 150 mmol/L — ABNORMAL HIGH (ref 135–145)
TCO2: 16 mmol/L — ABNORMAL LOW (ref 22–32)
pCO2, Ven: 34.8 mmHg — ABNORMAL LOW (ref 44.0–60.0)
pH, Ven: 7.238 — ABNORMAL LOW (ref 7.250–7.430)
pO2, Ven: 24 mmHg — CL (ref 32.0–45.0)

## 2021-08-19 LAB — SARS CORONAVIRUS 2 BY RT PCR (HOSPITAL ORDER, PERFORMED IN ~~LOC~~ HOSPITAL LAB): SARS Coronavirus 2: NEGATIVE

## 2021-08-19 LAB — MRSA NEXT GEN BY PCR, NASAL: MRSA by PCR Next Gen: NOT DETECTED

## 2021-08-19 LAB — POCT ACTIVATED CLOTTING TIME
Activated Clotting Time: 196 seconds
Activated Clotting Time: 225 seconds
Activated Clotting Time: 138 seconds

## 2021-08-19 LAB — PREPARE RBC (CROSSMATCH)

## 2021-08-19 SURGERY — PVC ABLATION
Anesthesia: General

## 2021-08-19 MED ORDER — SODIUM CHLORIDE 0.9% IV SOLUTION: Freq: Once | INTRAVENOUS | Status: AC

## 2021-08-19 MED ORDER — PROTAMINE SULFATE 10 MG/ML IV SOLN
INTRAVENOUS | Status: DC | PRN
  Administered 2021-08-19: 20 mg via INTRAVENOUS
  Administered 2021-08-19: 10 mg via INTRAVENOUS

## 2021-08-19 MED ORDER — HEPARIN SODIUM (PORCINE) 1000 UNIT/ML IJ SOLN
INTRAMUSCULAR | Status: DC | PRN
  Administered 2021-08-19: 1000 [IU] via INTRAVENOUS

## 2021-08-19 MED ORDER — FENTANYL CITRATE (PF) 250 MCG/5ML IJ SOLN
INTRAMUSCULAR | Status: DC | PRN
  Administered 2021-08-19 (×2): 25 ug via INTRAVENOUS

## 2021-08-19 MED ORDER — BUPIVACAINE HCL (PF) 0.25 % IJ SOLN
INTRAMUSCULAR | Status: DC | PRN
  Administered 2021-08-19: 45 mL

## 2021-08-19 MED ORDER — CHLORHEXIDINE GLUCONATE CLOTH 2 % EX PADS
6.0000 | MEDICATED_PAD | Freq: Every day | CUTANEOUS | Status: DC
  Administered 2021-08-19 – 2021-08-20 (×2): 6 via TOPICAL

## 2021-08-19 MED ORDER — ONDANSETRON HCL 4 MG/2ML IJ SOLN
INTRAMUSCULAR | Status: DC | PRN
  Administered 2021-08-19: 4 mg via INTRAVENOUS

## 2021-08-19 MED ORDER — POTASSIUM CHLORIDE 10 MEQ/100ML IV SOLN
INTRAVENOUS | Status: AC
  Filled 2021-08-19: qty 100

## 2021-08-19 MED ORDER — ACETAMINOPHEN 325 MG PO TABS
650.0000 mg | ORAL_TABLET | ORAL | Status: DC | PRN
  Administered 2021-08-19 (×2): 650 mg via ORAL
  Filled 2021-08-19 (×2): qty 2

## 2021-08-19 MED ORDER — PROPOFOL 500 MG/50ML IV EMUL
INTRAVENOUS | Status: DC | PRN
  Administered 2021-08-19: 75 ug/kg/min via INTRAVENOUS

## 2021-08-19 MED ORDER — SODIUM CHLORIDE 0.9 % IV SOLN: INTRAVENOUS | Status: DC

## 2021-08-19 MED ORDER — SODIUM CHLORIDE 0.9 % IV SOLN
Freq: Once | INTRAVENOUS | Status: AC
  Administered 2021-08-19: 999 mL/h via INTRAVENOUS

## 2021-08-19 MED ORDER — ONDANSETRON HCL 4 MG/2ML IJ SOLN: 4.0000 mg | Freq: Four times a day (QID) | INTRAMUSCULAR | Status: DC | PRN

## 2021-08-19 MED ORDER — POTASSIUM CHLORIDE 10 MEQ/100ML IV SOLN
10.0000 meq | INTRAVENOUS | Status: DC
  Administered 2021-08-19 (×2): 10 meq via INTRAVENOUS
  Filled 2021-08-19 (×3): qty 100

## 2021-08-19 MED ORDER — PHENYLEPHRINE HCL (PRESSORS) 10 MG/ML IV SOLN
INTRAVENOUS | Status: DC | PRN
  Administered 2021-08-19 (×2): 80 ug via INTRAVENOUS

## 2021-08-19 MED ORDER — SODIUM CHLORIDE 0.9% IV SOLUTION: Freq: Once | INTRAVENOUS | Status: DC

## 2021-08-19 MED ORDER — LIDOCAINE 2% (20 MG/ML) 5 ML SYRINGE
INTRAMUSCULAR | Status: DC | PRN
  Administered 2021-08-19: 60 mg via INTRAVENOUS

## 2021-08-19 MED ORDER — HEPARIN (PORCINE) IN NACL 1000-0.9 UT/500ML-% IV SOLN
INTRAVENOUS | Status: DC | PRN
  Administered 2021-08-19 (×2): 500 mL

## 2021-08-19 MED ORDER — HEPARIN SODIUM (PORCINE) 1000 UNIT/ML IJ SOLN
INTRAMUSCULAR | Status: DC | PRN
  Administered 2021-08-19: 3000 [IU] via INTRAVENOUS
  Administered 2021-08-19: 2000 [IU] via INTRAVENOUS
  Administered 2021-08-19: 7000 [IU] via INTRAVENOUS

## 2021-08-19 MED ORDER — SODIUM CHLORIDE 0.9% FLUSH
3.0000 mL | Freq: Two times a day (BID) | INTRAVENOUS | Status: DC
  Administered 2021-08-19 – 2021-08-20 (×2): 3 mL via INTRAVENOUS

## 2021-08-19 MED ORDER — POTASSIUM CHLORIDE 20 MEQ PO PACK
40.0000 meq | PACK | Freq: Once | ORAL | Status: AC
  Administered 2021-08-19: 40 meq via ORAL
  Filled 2021-08-19: qty 2

## 2021-08-19 MED ORDER — PHENYLEPHRINE HCL-NACL 20-0.9 MG/250ML-% IV SOLN
INTRAVENOUS | Status: DC | PRN
  Administered 2021-08-19: 25 ug/min via INTRAVENOUS

## 2021-08-19 MED ORDER — SODIUM CHLORIDE 0.9 % IV SOLN: 250.0000 mL | INTRAVENOUS | Status: DC | PRN

## 2021-08-19 MED ORDER — SODIUM CHLORIDE 0.9% FLUSH: 3.0000 mL | INTRAVENOUS | Status: DC | PRN

## 2021-08-19 SURGICAL SUPPLY — 17 items
BAG SNAP BAND KOVER 36X36 (MISCELLANEOUS) ×1 IMPLANT
CATH JOSEPH QUAD ALLRED 6F REP (CATHETERS) ×1 IMPLANT
CATH SMTCH THERMOCOOL SF DF (CATHETERS) ×2 IMPLANT
CATH WEB BI DIR CSDF CRV REPRO (CATHETERS) ×1 IMPLANT
FEM STOP ARCH (HEMOSTASIS) ×2
PACK EP LATEX FREE (CUSTOM PROCEDURE TRAY) ×2
PACK EP LF (CUSTOM PROCEDURE TRAY) ×1 IMPLANT
PAD PRO RADIOLUCENT 2001M-C (PAD) ×2 IMPLANT
PATCH CARTO3 (PAD) ×1 IMPLANT
SHEATH PINNACLE 6F 10CM (SHEATH) ×1 IMPLANT
SHEATH PINNACLE 7F 10CM (SHEATH) ×1 IMPLANT
SHEATH PINNACLE 8F 10CM (SHEATH) ×2 IMPLANT
SHEATH PINNACLE 9F 10CM (SHEATH) ×1 IMPLANT
SHEATH SET SUPER ARROWFLEX 8FR (SHEATH) ×1 IMPLANT
SYSTEM COMPRESSION FEMOSTOP (HEMOSTASIS) IMPLANT
TUBING SMART ABLATE COOLFLOW (TUBING) ×1 IMPLANT
WIRE HI TORQ VERSACORE-J 145CM (WIRE) ×1 IMPLANT

## 2021-08-19 NOTE — Discharge Instructions (Signed)
Post procedure care instructions No driving for 4 days. No lifting over 5 lbs for 1 week. No vigorous or sexual activity for 1 week. You may return to work/your usual activities on 08/27/21. Keep procedure site clean & dry. If you notice increased pain, swelling, bleeding or pus, call/return!  You may shower after 24 hours, but no soaking in baths/hot tubs/pools for 1 week.

## 2021-08-19 NOTE — Progress Notes (Signed)
First unit of PRBC started at 1445p and ended at 1555pm  Tolerated well by patient.

## 2021-08-19 NOTE — Progress Notes (Signed)
CRITICAL RESULT PROVIDER NOTIFICATION  Test performed and critical result:  Hgb 5.6, Hct 17.1, platelets 55  Date and time result received:  08/19/21, 1430  Provider name/title: Dr. Lovena Le  Date and time provider notified: 08/19/21, 1430  Date and time provider responded: MD responded to bedside   Provider response:new orders received

## 2021-08-19 NOTE — Progress Notes (Signed)
Noted Pt's became hypotensive and groin was checked.  Right groin was bleeding  excessively from site with a hematoma at this time.  Manual pressure was held agaiin, NS bolus , Dr Lovena Le aware and came to room to check patient , ultrasound done at bedside and Istat E7 with a second IVF started.  Orders given for Type and Cross, PRBC, CBC, and continue bolus of NS for 1000cc. Manual pressure held for 81mns and again for 25 mins after pt had break thru ooze.  Right groin was a level 1 with a bruise and tenderness at site. After hold. Site was observed for another 30 mins and a clean dressing was applied. Pt was stable at this time. PRBC was picked up and 1 units was transfused along with a run of K+.   Report called to 2H-CCU.  Pt transferred to a bed in the holding area.  Groin was checked before departure.  Upon arrival to CCU, groin was check again and it had started rebleeding again after a hour of no bleeding.  Manual pressure held for 30 mins.  Dr MAngelena Formand RJoseph ArtPA came to see pt and a femstop was ordered.

## 2021-08-19 NOTE — Progress Notes (Signed)
Called to [facility] for femostop application.  Femostop applied to right groin at 69mhg , no bleeding noted.  Right groin has ecchymosis present 2 inches by 4 inches around access sites. No hard hematoma present. Right dp and pt pulses palpable, pt very bounding after femostop application.  BP 130/78.

## 2021-08-19 NOTE — Progress Notes (Addendum)
Site area: right groin A 6,7,7 french venous sheath and a 9 french arterial was removed  Site Prior to Removal:  Level 0  Pressure Applied For 30 MINUTES    Bedrest Beginning at 1215p X 6 hours  Manual:   Yes.    Patient Status During Pull:  stable  Post Pull Groin Site:  Level 0  Post Pull Instructions Given:  Yes.    Post Pull Pulses Present:  Yes.    Dressing Applied:  Yes.    Comments:

## 2021-08-19 NOTE — Anesthesia Postprocedure Evaluation (Addendum)
Anesthesia Post Note  Patient: Paul Parks  Procedure(s) Performed: PVC ABLATION     Patient location during evaluation: Cath Lab Anesthesia Type: MAC Level of consciousness: awake Pain management: pain level controlled Respiratory status: spontaneous breathing Cardiovascular status: stable Postop Assessment: no apparent nausea or vomiting Anesthetic complications: no   No notable events documented.  Last Vitals:  Vitals:   08/19/21 1500 08/19/21 1515  BP: (!) 143/62 (!) 153/56  Pulse: 67 (!) 56  Resp: 15 11  Temp: 36.8 C   SpO2: 99% 97%    Last Pain:  Vitals:   08/19/21 1500  TempSrc: Oral  PainSc:                  Ruthie Berch

## 2021-08-19 NOTE — Transfer of Care (Signed)
Immediate Anesthesia Transfer of Care Note  Patient: Paul Parks  Procedure(s) Performed: PVC ABLATION  Patient Location: Cath Lab  Anesthesia Type:MAC  Level of Consciousness: awake, alert , oriented, patient cooperative and responds to stimulation  Airway & Oxygen Therapy: Patient Spontanous Breathing  Post-op Assessment: Report given to RN, Post -op Vital signs reviewed and stable and Patient moving all extremities X 4  Post vital signs: Reviewed and stable  Last Vitals:  Vitals Value Taken Time  BP 136/61 08/19/21 1124  Temp    Pulse 53 08/19/21 1125  Resp 18 08/19/21 1125  SpO2 98 % 08/19/21 1125  Vitals shown include unvalidated device data.  Last Pain:  Vitals:   08/19/21 0610  TempSrc:   PainSc: 0-No pain      Patients Stated Pain Goal: 3 (47/65/46 5035)  Complications: No notable events documented.

## 2021-08-19 NOTE — Progress Notes (Addendum)
Called about back pain. Pt reports chronic back pain that he thinks is his chronic pain exacerbated by laying for so many hours.  It is low back. Not radiating He denies any bellypain, known to have some testicle pains fropm prior not much different now then before.  Belly is soft, R foot pedal pulses are palpable  Will get CT belly BP stable SBP 140's, HR 60's, skin is warm/dry R groin appears stable, fem stop remains, nearly completed  2nd unit blood s finishing, pending platelets to be transfused  Dr. Quentin Ore at bedside with me.  Tommye Standard, PA-C

## 2021-08-19 NOTE — Progress Notes (Signed)
Pt arrived to the holding area with [facility] holding groin with sheath inserted and site actively bleeding.  Pressure was held until ACT was completed at a level of 138.  Sheaths were pulled and manual pressure was started.

## 2021-08-19 NOTE — Interval H&P Note (Signed)
History and Physical Interval Note:  08/19/2021 7:16 AM  Paul Parks  has presented today for surgery, with the diagnosis of pvc.  The various methods of treatment have been discussed with the patient and family. After consideration of risks, benefits and other options for treatment, the patient has consented to  Procedure(s): PVC ABLATION (N/A) as a surgical intervention.  The patient's history has been reviewed, patient examined, no change in status, stable for surgery.  I have reviewed the patient's chart and labs.  Questions were answered to the patient's satisfaction.     [name]

## 2021-08-19 NOTE — Anesthesia Preprocedure Evaluation (Addendum)
Anesthesia Evaluation  Patient identified by MRN, date of birth, ID band Patient awake    Reviewed: Allergy & Precautions, NPO status , Patient's Chart, lab work & pertinent test results  Airway Mallampati: II  TM Distance: >3 FB     Dental   Pulmonary neg pulmonary ROS,    breath sounds clear to auscultation       Cardiovascular hypertension,  Rhythm:Regular Rate:Normal     Neuro/Psych negative neurological ROS     GI/Hepatic negative GI ROS, Neg liver ROS,   Endo/Other  negative endocrine ROS  Renal/GU negative Renal ROS     Musculoskeletal   Abdominal   Peds  Hematology   Anesthesia Other Findings   Reproductive/Obstetrics                             Anesthesia Physical Anesthesia Plan  ASA: 3  Anesthesia Plan: MAC   Post-op Pain Management:    Induction: Intravenous  PONV Risk Score and Plan: 2 and Treatment may vary due to age or medical condition, Ondansetron and Dexamethasone  Airway Management Planned: Nasal Cannula and Simple Face Mask  Additional Equipment:   Intra-op Plan:   Post-operative Plan:   Informed Consent: I have reviewed the patients History and Physical, chart, labs and discussed the procedure including the risks, benefits and alternatives for the proposed anesthesia with the patient or authorized representative who has indicated his/her understanding and acceptance.     Dental advisory given  Plan Discussed with: CRNA and Anesthesiologist  Anesthesia Plan Comments:        Anesthesia Quick Evaluation

## 2021-08-19 NOTE — Progress Notes (Signed)
Femostop removed at 2345 per protocol. Site stable level 1 (bruising, no hematoma, no bleeding). Dressed with gauze and tegaderm. Follow up CBC WDL.  Pt educated to hold pressure on site when coughing and bearing down. Educated to notify RN if site feels wet or R leg feels different. Educated about mobility restrictions. Assisted pt in turning to the affected side with wedge support (this greatly relieved his back pain). Site stable before and after mobility.  Bedrest will end at 0600 08/22/21. Pt aware.

## 2021-08-19 NOTE — Progress Notes (Signed)
Called about recurrent R groin bleeding 2rd episode since intial pull of sheaths post procedure  Pt's BP has been stable since 1st rebleed post IVF, bedside echo stable) 2nd bleed was marked with noted drop in Hgb from pre-op 13 >> 5 2u prbc were ordered   2nd unit is just hang8ing Dr. Angelena Form at bedside, groin has some mild ecchymosis though is soft  Tender with 3rd prolonged hold No belly tenderness, belly is soft  In d/w staff, pt bleed externally markedly No exam findings to suggest retroperitoneal bleeding, no belly or new/unusual back pain/tenderness Ho hematoma noted  We have ordered and cath lab staff has placed fem-stop  I have orderd platelets given his plts 55 K+ was also noted 2.6 and K+ has already been ordered. Pt appears hemodynamically stable  Tommye Standard, PA_C

## 2021-08-19 NOTE — Progress Notes (Signed)
EP attending  I was called to the patient's bedside after he is dropped his blood pressure to 60 mmHg.  The patient had developed extensive bleeding after his ablation and after initial sheath removal.  The patient was given intravenous fluids and manual pressure was held over the right groin region.  A quick look portable bedside echo was carried out by myself demonstrated no pericardial effusion.  The patient was given intravenous fluids and was transfused 2 units of packed red blood cells after it was discovered that his hemoglobin was 5-1/2.  He will be observed in the ICU and if he has any additional bleeding undergo CT scanning to rule out retroperitoneal hematoma.  Of note his platelet count was found to be 55,000 raising the question of heparin-induced thrombocytopenia which would have contributed to his propensity to bleed.  [name], MD

## 2021-08-20 ENCOUNTER — Encounter (HOSPITAL_COMMUNITY): Payer: Self-pay | Admitting: Internal Medicine

## 2021-08-20 LAB — BASIC METABOLIC PANEL
Anion gap: 9 (ref 5–15)
BUN: 15 mg/dL (ref 8–23)
CO2: 24 mmol/L (ref 22–32)
Calcium: 8.5 mg/dL — ABNORMAL LOW (ref 8.9–10.3)
Chloride: 104 mmol/L (ref 98–111)
Creatinine, Ser: 1.1 mg/dL (ref 0.61–1.24)
GFR, Estimated: >60 mL/min (ref >60)
Glucose, Bld: 112 mg/dL — ABNORMAL HIGH (ref 70–99)
Potassium: 5.1 mmol/L (ref 3.5–5.1)
Sodium: 137 mmol/L (ref 135–145)

## 2021-08-20 LAB — BPAM RBC
Blood Product Expiration Date: 202209222359
Blood Product Expiration Date: 202210062359
ISSUE DATE / TIME: 202209151428
ISSUE DATE / TIME: 202209151428
Unit Type and Rh: 6200
Unit Type and Rh: 6200

## 2021-08-20 LAB — TYPE AND SCREEN
ABO/RH(D): A POS
Antibody Screen: NEGATIVE
Unit division: 0
Unit division: 0

## 2021-08-20 LAB — PREPARE PLATELET PHERESIS: Unit division: 0

## 2021-08-20 LAB — CBC
HCT: 34.8 % — ABNORMAL LOW (ref 39.0–52.0)
Hemoglobin: 12.1 g/dL — ABNORMAL LOW (ref 13.0–17.0)
MCH: 31.6 pg (ref 26.0–34.0)
MCHC: 34.8 g/dL (ref 30.0–36.0)
MCV: 90.9 fL (ref 80.0–100.0)
Platelets: 123 10*3/uL — ABNORMAL LOW (ref 150–400)
RBC: 3.83 MIL/uL — ABNORMAL LOW (ref 4.22–5.81)
RDW: 13.8 % (ref 11.5–15.5)
WBC: 8.4 10*3/uL (ref 4.0–10.5)
nRBC: 0 % (ref 0.0–0.2)

## 2021-08-20 LAB — BPAM PLATELET PHERESIS
Blood Product Expiration Date: 202209172359
ISSUE DATE / TIME: 202209151653
Unit Type and Rh: 6200

## 2021-08-20 NOTE — Discharge Summary (Addendum)
ELECTROPHYSIOLOGY PROCEDURE DISCHARGE SUMMARY    Patient ID: Paul Parks,  MRN: [id number], DOB/AGE: 1935/12/11 85 y.o.  Admit date: 08/19/2021 Discharge date: 08/20/2021  Primary Care Physician: [name], DO  Primary Cardiologist/Electrophysiologist: Dr. Lovena Le  Primary Discharge Diagnosis:  Symptomatic PVCs Acute blood loss anemia  Secondary Discharge Diagnosis:  HTN Sinus node dysfunction HLD  Allergies  Allergen Reactions   Sulfa Antibiotics Itching    Rash    Ibuprofen Swelling   Gabapentin     Swelling, itching   Vesicare [Solifenacin] Other (See Comments)    HA      Procedures This Admission:  08/19/21: EPS/Ablation Conclusion: Successful catheter ablation of multiple PVC foci.  The ablation was made more difficult by the multitude of PVCs and with the initial clinical PVC located very close to the His bundle region  Brief HPI: Paul Parks is a 85 y.o. male is followed by EP, dr. Lovena Le out patient with known PVCs that he had become symptomatic with due to functional bradycardia and fatigue.   Risks, benefits, and alternatives to planned procedure were reviewed with the patient who wished to proceed.   Hospital Course:  The patient was admitted and underwent EPS/ablation.  Post procedure he developed unnoticed femoral artery site bleeding that resulted in hypotension and marked anemia.  IVF and PRBC given with excellent recovery of his BP.  Local pressure was held, though re bled and required pressure held a second time as well.  Plts were low post procedure and also given platelets.  CT abdomen did not note retroperitoneal bleeding and his groin site remained stable.  Labs were monitored with recovery of his H/H and stable today, day of discharge.  Hypokalemia also replaced and wnl  He was monitored on telemetry overnight which demonstrated SR, he had PVCs though burden felt to be improved.   The patient feels much better today denies any  CP/SOB, minimal R groin discomfort.  Site has ecchymosis both laterally and medially though is soft and without hematoma. He has ambulated in the unit and been OOB without symptoms of difficulty He was examined by Dr. Lovena Le and considered stable for discharge to home.    Physical Exam: Vitals:   08/20/21 0900 08/20/21 1000 08/20/21 1100 08/20/21 1105  BP: (!) 119/59 (!) 114/52    Pulse: (!) 30 (!) 47    Resp: '15 15 14   '$ Temp:    98 F (36.7 C)  TempSrc:    Oral  SpO2: 96% 99%    Weight:      Height:         Labs:   Lab Results  Component Value Date   WBC 8.4 08/20/2021   HGB 12.1 (L) 08/20/2021   HCT 34.8 (L) 08/20/2021   MCV 90.9 08/20/2021   PLT 123 (L) 08/20/2021    Recent Labs  Lab 08/20/21 0041  NA 137  K 5.1  CL 104  CO2 24  BUN 15  CREATININE 1.10  CALCIUM 8.5*  GLUCOSE 112*    Discharge Medications:  Allergies as of 08/20/2021       Reactions   Sulfa Antibiotics Itching   Rash    Ibuprofen Swelling   Gabapentin    Swelling, itching   Vesicare [solifenacin] Other (See Comments)   HA         Medication List     TAKE these medications    alfuzosin 10 MG 24 hr tablet Commonly known as: UROXATRAL Take 10 mg  by mouth daily.   amLODipine 2.5 MG tablet Commonly known as: NORVASC TAKE 1 TABLET BY MOUTH EVERY DAY   aspirin 81 MG tablet Take 81 mg by mouth daily. Notes to patient: Hold for 1 week , resume 08/27/21   cholecalciferol 1000 units tablet Commonly known as: VITAMIN D Take 1,000 Units by mouth daily.   fluticasone 50 MCG/ACT nasal spray Commonly known as: FLONASE Place 1 spray into both nostrils daily as needed for allergies.   meclizine 12.5 MG tablet Commonly known as: ANTIVERT Take 12.5 mg by mouth 4 (four) times daily as needed. Take one (1) tablet (12.5 mg) by mouth four (4) times daily with meals and at bedtime for dizziness as needed   meloxicam 15 MG tablet Commonly known as: MOBIC TAKE 1/2 TO 1 TABLET BY MOUTH  EVERY DAY AFTER A MEAL. What changed: See the new instructions. Notes to patient: Avoid use post procedure for a week please   multivitamin with minerals Tabs tablet Take 1 tablet by mouth daily.   rosuvastatin 10 MG tablet Commonly known as: CRESTOR TAKE 1 TABLET BY MOUTH 2 TIMES A WEEK What changed:  how much to take how to take this when to take this   tadalafil 10 MG tablet Commonly known as: Cialis Take 1 tablet (10 mg total) by mouth daily as needed for erectile dysfunction.   Travatan Z 0.004 % Soln ophthalmic solution Generic drug: Travoprost (BAK Free) Place 1 drop into both eyes at bedtime.        Disposition: Home Discharge Instructions     Diet - low sodium heart healthy   Complete by: As directed    Increase activity slowly   Complete by: As directed        Follow-up Information     Evans Lance, MD Follow up.   Specialty: Cardiology Why: 09/29/21 @ 3:45PM Contact information: [id number] [place] [address] [phone]         Shirley Friar, PA-C Follow up.   Specialty: Physician Assistant Why: 08/27/21 @ 9:40AM, post procedure follow up Contact information: [address] [place] [address] [phone]                 Duration of Discharge Encounter: Greater than 30 minutes including physician time.  Venetia Night, PA-C 08/20/2021 12:36 PM  EP Attending  Patient seen and examined. Agree with above. The patient is doing well after EPS/RFA of multiple PVC's complicated by bleeding of the femoral artery and vein. He has been transfused and his H/H are stable. Still some PVC's but improved. He will be discharged home with usual followup.  Carleene Overlie Tiandre Teall,MD

## 2021-08-20 NOTE — Progress Notes (Addendum)
Progress Note  Patient Name: Akzel Parks Date of Encounter: 08/20/2021  [facility] Cardiologist: Dr. Lovena Le  Subjective   No CP, SOB, back pain much better after he was allowed to adjust in bed  Inpatient Medications    Scheduled Meds:  sodium chloride   Intravenous Once   Chlorhexidine Gluconate Cloth  6 each Topical Daily   sodium chloride flush  3 mL Intravenous Q12H   Continuous Infusions:  sodium chloride     PRN Meds: sodium chloride, acetaminophen, ondansetron (ZOFRAN) IV, sodium chloride flush   Vital Signs    Vitals:   08/20/21 0400 08/20/21 0500 08/20/21 0600 08/20/21 0700  BP: 138/68 (!) 145/70 133/74 133/63  Pulse: 62 (!) 58 (!) 59 (!) 59  Resp: (!) '9 10 16 17  '$ Temp:      TempSrc:      SpO2: 95% 95% 96% 97%  Weight:      Height:        Intake/Output Summary (Last 24 hours) at 08/20/2021 0726 Last data filed at 08/19/2021 2345 Gross per 24 hour  Intake 2206.4 ml  Output 620 ml  Net 1586.4 ml   Last 3 Weights 08/19/2021 08/03/2021 05/14/2021  Weight (lbs) 175 lb 183 lb 3.2 oz 183 lb 3.2 oz  Weight (kg) 79.379 kg 83.099 kg 83.099 kg      Telemetry    SR, 1st degree AVblock, occasional PVCs - Personally Reviewed  ECG    SR 76, 1st degree AVblock 292m, LAD, PVCs - Personally Reviewed  Physical Exam   GEN: No acute distress.   Neck: No JVD Cardiac: RRR, no murmurs, rubs, or gallops.  Respiratory: CTA b/l. GI: Soft, nontender, non-distended  MS: No edema; No deformity. Neuro:  Nonfocal  Psych: Normal affect   R groin with ecchymosis is soft, no hematoma, minimally tender, good pedal pulses R  Labs    High Sensitivity Troponin:  No results for input(s): TROPONINIHS in the last 720 hours.   Chemistry Recent Labs  Lab 08/19/21 1328 08/19/21 2218 08/20/21 0041  NA 150* 136 137  K 2.6* 4.6 5.1  CL  --  105 104  CO2  --  25 24  GLUCOSE  --  112* 112*  BUN  --  15 15  CREATININE  --  1.18 1.10  CALCIUM  --  8.2* 8.5*   GFRNONAA  --  >60 >60  ANIONGAP  --  6 9    Lipids No results for input(s): CHOL, TRIG, HDL, LABVLDL, LDLCALC, CHOLHDL in the last 168 hours.  Hematology Recent Labs  Lab 08/19/21 1339 08/19/21 2218 08/20/21 0041  WBC 4.6 8.7 8.4  RBC 1.75* 3.81* 3.83*  HGB 5.6* 11.8* 12.1*  HCT 17.1* 35.2* 34.8*  MCV 97.7 92.4 90.9  MCH 32.0 31.0 31.6  MCHC 32.7 33.5 34.8  RDW 12.4 13.7 13.8  PLT 55* 126* 123*   Thyroid No results for input(s): TSH, FREET4 in the last 168 hours.  BNPNo results for input(s): BNP, PROBNP in the last 168 hours.  DDimer No results for input(s): DDIMER in the last 168 hours.   Radiology    CT ABDOMEN PELVIS WO CONTRAST Result Date: 08/19/2021 CLINICAL DATA:  Lower abdominal pain and testicular pain, recent catheterization, persistent bleeding from groin EXAM: CT ABDOMEN AND PELVIS WITHOUT CONTRAST TECHNIQUE: Multidetector CT imaging of the abdomen and pelvis was performed following the standard protocol without IV contrast. Unenhanced CT was performed per clinician order. Lack of IV contrast limits sensitivity  and specificity, especially for evaluation of abdominal/pelvic solid viscera. COMPARISON:  11/08/2019 FINDINGS: Lower chest: Minimal hypoventilatory changes at the lung bases. Hepatobiliary: No focal liver abnormality is seen. No gallstones, gallbladder wall thickening, or biliary dilatation. Pancreas: Unremarkable. No pancreatic ductal dilatation or surrounding inflammatory changes. Spleen: Normal in size without focal abnormality. Adrenals/Urinary Tract: Bilateral renal atrophy. No urinary tract calculi or obstructive uropathy. The adrenals and bladder are unremarkable. Stomach/Bowel: No bowel obstruction or ileus. Scattered distal colonic diverticulosis without diverticulitis. Normal appendix right lower quadrant. No bowel wall thickening or inflammatory change. Vascular/Lymphatic: Evaluation of the vascular structures is limited without IV contrast. Mild diffuse  aortic atherosclerosis. Pressure dressing is applied at the right inguinal region of the site of prior catheterization. There is diffuse subcutaneous fat stranding within the right inguinal region and extending along the medial right thigh and right hemiscrotum. No fluid collection to suggest hematoma on this unenhanced exam. No pathologic adenopathy within the abdomen or pelvis. Reproductive: Stable enlarged prostate. Right scrotal wall edema due to the edematous change related to right inguinal catheterization. Visualized portion of the testicles unremarkable. Other: No free intraperitoneal fluid or free gas. No abdominal wall hernia. Musculoskeletal: No acute or destructive bony lesions. Reconstructed images demonstrate no additional findings. IMPRESSION: 1. Postprocedural changes related to right inguinal catheterization. Pressure dressing is in place, with subcutaneous edema seen within the right inguinal region, medial right thigh, and right hemiscrotum. No fluid collection to suggest hematoma. Evaluation of the vascular structures limited without IV contrast. 2. Colonic diverticulosis without diverticulitis. 3. Stable enlarged prostate. 4.  Aortic Atherosclerosis (ICD10-I70.0). Electronically Signed   By: Randa Ngo M.D.   On: 08/19/2021 18:55     Cardiac Studies   Nov 2021: monitor 1. NSR with sinus bradycardia and first degree AV block 2. Mostly nocturnal but also some evening CHB, self limited 3. Frequent PVC's which are polymorphic 4. No atrial fib or flutter.  5. No sustained VT or SVT  10/09/20: TTE  IMPRESSIONS   1. Left ventricular ejection fraction, by estimation, is 60 to 65%. The  left ventricle has normal function. The left ventricle has no regional  wall motion abnormalities. There is mild concentric left ventricular  hypertrophy. Left ventricular diastolic  parameters are consistent with Grade I diastolic dysfunction (impaired  relaxation).   2. Right ventricular systolic  function is normal. The right ventricular  size is normal.   3. The mitral valve is normal in structure. Trivial mitral valve  regurgitation.   4. The aortic valve is tricuspid. There is mild calcification of the  aortic valve. There is moderate thickening of the aortic valve. Aortic  valve regurgitation is mild.   5. Aortic dilatation noted. There is mild dilatation of the aortic root,  measuring 41 mm. There is mild to moderate dilatation of the ascending  aorta, measuring 39 mm.   6. The inferior vena cava is normal in size with greater than 50%  respiratory variability, suggesting right atrial pressure of 3 mmHg.   Comparison(s): Compared 05/2018, there is no significant change.    June 2019: 48 holter 1. NSR with sinus tachy and sinus bradycardia 2. First degree AV block 3. Frequent PVC"s and PAC's 4. NS AT and VT 5. No sustained SVT or VT 6. Noise artifact is present 7. No significant pauses   PVC burden was 14% (noting bigeminy, trigeminy and couplets, as well as NSVT, longest 4 beats)       05/25/2018: TTE Study Conclusions  -  Left ventricle: The cavity size was normal. Systolic function was    normal. The estimated ejection fraction was in the range of 55%    to 60%. Wall motion was normal; there were no regional wall    motion abnormalities. Doppler parameters are consistent with    abnormal left ventricular relaxation (grade 1 diastolic    dysfunction).  - Aortic valve: Transvalvular velocity was within the normal range.    There was no stenosis. There was mild regurgitation. Valve area    (VTI): 3.18 cm^2. Valve area (Vmax): 3.27 cm^2. Valve area    (Vmean): 3.7 cm^2.  - Aorta: Ascending aortic diameter: 39 mm (S).  - Ascending aorta: The ascending aorta was mildly dilated.  - Mitral valve: Transvalvular velocity was within the normal range.    There was no evidence for stenosis. There was trivial    regurgitation.  - Left atrium: The atrium was mildly  dilated.  - Right ventricle: The cavity size was normal. Wall thickness was    normal. Systolic function was normal.  - Atrial septum: No defect or patent foramen ovale was identified    by color flow Doppler.  - Tricuspid valve: There was mild regurgitation.  - Pulmonary arteries: Systolic pressure was within the normal    range. PA peak pressure: 25 mm Hg (S).     Patient Profile     85 y.o. male HTN, HLD, SN dysfunction, PVCs underwent EPS, ablation for his PVCs yesterday, complicated post procedure with R fem artery bleeding  Assessment & Plan    Acute bleeding post procedure R fem arterial bleeding R groin has ecchymosis, is soft, minimal tenderness No recurrent bleeding post fem stop placement yesterday evening CT without retroperitoneal bleeding  Hgb responded to PRBC, stable this AM Plts also better  2. PVCs Continue at times more and less frequent, not persistently bigeminal  3. HTN BP stable post acute blood loss    He has had excellent and faster then anticipated recovery and stabilization of his labs, I anticipate transfer out of ICU and most likely home today Dr. Lovena Le to see   For questions or updates, please contact [facility] Please consult [email or web address] for contact info under        Signed, Baldwin Jamaica, PA-C  08/20/2021, 7:27 AM     EP Attending  Patient seen and examined. Discussed with Tommye Standard, PA-C. The patient is doing well after PVC ablation and subsequent groin bleed. He has occaisional PVC's. He will be discharged home with usual followup. His right leg demonstrates ecchymosis but no significant hematoma.   Carleene Overlie Nevin Kozuch,MD

## 2021-08-20 NOTE — Plan of Care (Signed)

## 2021-08-21 LAB — HEPARIN INDUCED PLATELET AB (HIT ANTIBODY): Heparin Induced Plt Ab: 0.111 OD (ref 0.000–0.400)

## 2021-08-22 NOTE — Addendum Note (Signed)
Addendum  created 08/22/21 2026 by Belinda Block, MD   Clinical Note Signed

## 2021-08-22 NOTE — Addendum Note (Signed)
Addendum  created 08/22/21 2025 by Belinda Block, MD   Clinical Note Signed
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
## 2021-08-26 ENCOUNTER — Telehealth: Payer: Self-pay

## 2021-08-26 NOTE — Telephone Encounter (Signed)
Transition Care Management Follow-up Telephone Call Date of discharge and from where: 08/20/21 from [facility] How have you been since you were released from the hospital? "Doing pretty good, still sore and swollen in groin, just a little bleeding on bandage" Any questions or concerns? No Reviewed post procedure care instructions with patient. Items Reviewed: Did the pt receive and understand the discharge instructions provided? Yes  Medications obtained and verified? Yes  Other? No  Any new allergies since your discharge? No  Dietary orders reviewed? Yes Do you have support at home? Yes   Home Care and Equipment/Supplies: Were home health services ordered? no If so, what is the name of the agency? N/a  Has the agency set up a time to come to the patient's home? not applicable Were any new equipment or medical supplies ordered?  No What is the name of the medical supply agency? N/a Were you able to get the supplies/equipment? not applicable Do you have any questions related to the use of the equipment or supplies? No  Functional Questionnaire: (I = Independent and D = Dependent) ADLs: I  Bathing/Dressing- I  Meal Prep- D  Eating- I  Maintaining continence- I  Transferring/Ambulation- I  Managing Meds- I  Follow up appointments reviewed: PCP Hospital f/u appt confirmed? Following up with specialist. [facility] f/u appt confirmed? Yes  Scheduled to see Sheran Luz, PA-C on 08/27/21 @ 9:40 am and [name] on 09/29/21 @ 3:45 pm. Are transportation arrangements needed? No  If their condition worsens, is the pt aware to call PCP or go to the Emergency Dept.? Yes Was the patient provided with contact information for the PCP's office or ED? Yes Was to pt encouraged to call back with questions or concerns? Yes   Thea Silversmith, RN, MSN, BSN, [place] Care Management Coordinator [phone]

## 2021-08-27 ENCOUNTER — Encounter: Payer: Self-pay | Admitting: Student

## 2021-08-27 ENCOUNTER — Other Ambulatory Visit: Payer: Self-pay

## 2021-08-27 ENCOUNTER — Ambulatory Visit: Payer: Medicare PPO | Admitting: Student

## 2021-08-27 VITALS — BP 108/60 | HR 56 | Ht 70.0 in | Wt 181.8 lb

## 2021-08-27 DIAGNOSIS — I441 Atrioventricular block, second degree: Secondary | ICD-10-CM | POA: Diagnosis not present

## 2021-08-27 DIAGNOSIS — I493 Ventricular premature depolarization: Secondary | ICD-10-CM

## 2021-08-27 DIAGNOSIS — I10 Essential (primary) hypertension: Secondary | ICD-10-CM | POA: Diagnosis not present

## 2021-08-27 LAB — CBC
Hematocrit: 32.3 % — ABNORMAL LOW (ref 37.5–51.0)
Hemoglobin: 11.3 g/dL — ABNORMAL LOW (ref 13.0–17.7)
MCH: 31.5 pg (ref 26.6–33.0)
MCHC: 35 g/dL (ref 31.5–35.7)
MCV: 90 fL (ref 79–97)
Platelets: 152 10*3/uL (ref 150–450)
RBC: 3.59 x10E6/uL — ABNORMAL LOW (ref 4.14–5.80)
RDW: 12.3 % (ref 11.6–15.4)
WBC: 5.5 10*3/uL (ref 3.4–10.8)

## 2021-08-27 LAB — BASIC METABOLIC PANEL
BUN/Creatinine Ratio: 13 (ref 10–24)
BUN: 14 mg/dL (ref 8–27)
CO2: 24 mmol/L (ref 20–29)
Calcium: 9.3 mg/dL (ref 8.6–10.2)
Chloride: 96 mmol/L (ref 96–106)
Creatinine, Ser: 1.11 mg/dL (ref 0.76–1.27)
Glucose: 79 mg/dL (ref 65–99)
Potassium: 4.8 mmol/L (ref 3.5–5.2)
Sodium: 134 mmol/L (ref 134–144)
eGFR: 66 mL/min/{1.73_m2} (ref >59)

## 2021-08-27 NOTE — Patient Instructions (Signed)
Medication Instructions:  Your physician recommends that you continue on your current medications as directed. Please refer to the Current Medication list given to you today.  *If you need a refill on your cardiac medications before your next appointment, please call your pharmacy*   Lab Work: TODAY: BMET, CBC If you have labs (blood work) drawn today and your tests are completely normal, you will receive your results only by: [place] (if you have MyChart) OR A paper copy in the mail If you have any lab test that is abnormal or we need to change your treatment, we will call you to review the results.

## 2021-09-29 ENCOUNTER — Encounter: Payer: Self-pay | Admitting: Internal Medicine

## 2021-09-29 ENCOUNTER — Ambulatory Visit: Payer: Medicare PPO | Admitting: Internal Medicine

## 2021-09-29 ENCOUNTER — Other Ambulatory Visit: Payer: Self-pay

## 2021-09-29 VITALS — BP 148/80 | HR 47 | Ht 70.0 in | Wt 185.0 lb

## 2021-09-29 DIAGNOSIS — I495 Sick sinus syndrome: Secondary | ICD-10-CM | POA: Diagnosis not present

## 2021-09-29 DIAGNOSIS — I493 Ventricular premature depolarization: Secondary | ICD-10-CM | POA: Diagnosis not present

## 2021-09-29 MED ORDER — AMLODIPINE BESYLATE 5 MG PO TABS: 5.0000 mg | ORAL_TABLET | Freq: Every day | ORAL | 3 refills | Status: DC
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
## 2021-09-29 NOTE — Patient Instructions (Addendum)
Medication Instructions:   Your physician has recommended you make the following change in your medication:    INCREASE your amlodipine-  Take 5 mg by  mouth once a day.  (You may take 2 tablets of your 2.5 mg tablets until they run out)  Labwork: None ordered.  Testing/Procedures: None ordered.  Follow-Up: Your physician wants you to follow-up in: 6 weeks with Dr. Lajuana Ripple.  Please call her office to schedule this appointment.   Any Other Special Instructions Will Be Listed Below (If Applicable).  If you need a refill on your cardiac medications before your next appointment, please call your pharmacy.

## 2021-10-04 DIAGNOSIS — D485 Neoplasm of uncertain behavior of skin: Secondary | ICD-10-CM | POA: Diagnosis not present

## 2021-10-04 DIAGNOSIS — C44319 Basal cell carcinoma of skin of other parts of face: Secondary | ICD-10-CM | POA: Diagnosis not present

## 2021-10-04 NOTE — Addendum Note (Signed)
Addended by: Glean Salen on: 10/04/2021 08:18 AM   Modules accepted: Orders

## 2021-10-08 ENCOUNTER — Other Ambulatory Visit: Payer: Self-pay

## 2021-10-08 ENCOUNTER — Ambulatory Visit (INDEPENDENT_AMBULATORY_CARE_PROVIDER_SITE_OTHER): Payer: Medicare PPO

## 2021-10-08 DIAGNOSIS — Z23 Encounter for immunization: Secondary | ICD-10-CM | POA: Diagnosis not present

## 2021-10-11 DIAGNOSIS — Z85828 Personal history of other malignant neoplasm of skin: Secondary | ICD-10-CM | POA: Diagnosis not present

## 2021-10-11 DIAGNOSIS — Z4802 Encounter for removal of sutures: Secondary | ICD-10-CM | POA: Diagnosis not present

## 2021-11-05 DIAGNOSIS — H401123 Primary open-angle glaucoma, left eye, severe stage: Secondary | ICD-10-CM | POA: Diagnosis not present

## 2021-11-05 DIAGNOSIS — H401112 Primary open-angle glaucoma, right eye, moderate stage: Secondary | ICD-10-CM | POA: Diagnosis not present

## 2021-11-05 DIAGNOSIS — Z79899 Other long term (current) drug therapy: Secondary | ICD-10-CM | POA: Diagnosis not present

## 2021-11-12 ENCOUNTER — Other Ambulatory Visit: Payer: Self-pay | Admitting: Internal Medicine

## 2021-11-12 MED ORDER — AMLODIPINE BESYLATE 5 MG PO TABS: 5.0000 mg | ORAL_TABLET | Freq: Every day | ORAL | 3 refills | Status: DC

## 2021-11-15 ENCOUNTER — Encounter: Payer: Self-pay | Admitting: Family Medicine

## 2021-11-15 ENCOUNTER — Ambulatory Visit: Payer: Medicare PPO | Admitting: Family Medicine

## 2021-11-15 VITALS — BP 116/63 | HR 56 | Temp 97.9°F | Ht 70.0 in | Wt 183.6 lb

## 2021-11-15 DIAGNOSIS — R1900 Intra-abdominal and pelvic swelling, mass and lump, unspecified site: Secondary | ICD-10-CM

## 2021-11-15 DIAGNOSIS — I493 Ventricular premature depolarization: Secondary | ICD-10-CM

## 2021-11-15 DIAGNOSIS — D649 Anemia, unspecified: Secondary | ICD-10-CM

## 2021-11-15 DIAGNOSIS — I10 Essential (primary) hypertension: Secondary | ICD-10-CM
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
## 2021-11-16 ENCOUNTER — Telehealth: Payer: Self-pay | Admitting: Family Medicine

## 2021-11-16 LAB — BASIC METABOLIC PANEL
BUN/Creatinine Ratio: 16 (ref 10–24)
BUN: 17 mg/dL (ref 8–27)
CO2: 28 mmol/L (ref 20–29)
Calcium: 9.2 mg/dL (ref 8.6–10.2)
Chloride: 101 mmol/L (ref 96–106)
Creatinine, Ser: 1.06 mg/dL (ref 0.76–1.27)
Glucose: 94 mg/dL (ref 70–99)
Potassium: 4.7 mmol/L (ref 3.5–5.2)
Sodium: 137 mmol/L (ref 134–144)
eGFR: 70 mL/min/{1.73_m2} (ref >59)

## 2021-11-16 LAB — CBC
Hematocrit: 38.4 % (ref 37.5–51.0)
Hemoglobin: 13 g/dL (ref 13.0–17.7)
MCH: 30.6 pg (ref 26.6–33.0)
MCHC: 33.9 g/dL (ref 31.5–35.7)
MCV: 90 fL (ref 79–97)
Platelets: 121 10*3/uL — ABNORMAL LOW (ref 150–450)
RBC: 4.25 x10E6/uL (ref 4.14–5.80)
RDW: 12.6 % (ref 11.6–15.4)
WBC: 4.3 10*3/uL (ref 3.4–10.8)

## 2021-11-16 LAB — FERRITIN: Ferritin: 48 ng/mL (ref 30–400)

## 2021-11-16 LAB — MAGNESIUM: Magnesium: 2 mg/dL (ref 1.6–2.3)

## 2021-11-16 NOTE — Telephone Encounter (Signed)
Had cholesterol done in June and it was normal. Only reason we repeated for his wife was because her prior one was abnormal.  Does not need more than once yearly testing given stability of lipid.

## 2021-11-16 NOTE — Telephone Encounter (Signed)
Pt called stating that he needs lab work to have his cholesterol checked since it was not checked yesterday when blood work was done.  Requesting order for new lab work to be added so he can have his cholesterol checked, or wants to know if lab can just use blood that was taken yesterday?  Please advise.

## 2021-11-17 ENCOUNTER — Other Ambulatory Visit: Payer: Self-pay | Admitting: Family Medicine

## 2021-11-17 DIAGNOSIS — R1900 Intra-abdominal and pelvic swelling, mass and lump, unspecified site: Secondary | ICD-10-CM

## 2021-11-17 DIAGNOSIS — C44319 Basal cell carcinoma of skin of other parts of face: Secondary | ICD-10-CM | POA: Diagnosis not present

## 2021-11-24 DIAGNOSIS — Z4802 Encounter for removal of sutures: Secondary | ICD-10-CM | POA: Diagnosis not present

## 2021-11-24 DIAGNOSIS — C44319 Basal cell carcinoma of skin of other parts of face: Secondary | ICD-10-CM | POA: Diagnosis not present

## 2021-11-24 DIAGNOSIS — Z483 Aftercare following surgery for neoplasm: Secondary | ICD-10-CM | POA: Diagnosis not present

## 2021-11-26 ENCOUNTER — Ambulatory Visit (HOSPITAL_COMMUNITY)
Admission: RE | Admit: 2021-11-26 | Discharge: 2021-11-26 | Disposition: A | Discharge status: Home / Self Care | Payer: Medicare PPO | Source: Ambulatory Visit | Attending: Family Medicine | Admitting: Family Medicine

## 2021-11-26 ENCOUNTER — Other Ambulatory Visit: Payer: Self-pay

## 2021-11-26 DIAGNOSIS — Z0389 Encounter for observation for other suspected diseases and conditions ruled out: Secondary | ICD-10-CM | POA: Diagnosis not present

## 2021-11-26 DIAGNOSIS — R1900 Intra-abdominal and pelvic swelling, mass and lump, unspecified site: Secondary | ICD-10-CM | POA: Diagnosis not present

## 2021-12-09 ENCOUNTER — Ambulatory Visit (INDEPENDENT_AMBULATORY_CARE_PROVIDER_SITE_OTHER): Payer: Medicare PPO

## 2021-12-09 VITALS — Ht 70.0 in | Wt 183.0 lb

## 2021-12-09 DIAGNOSIS — Z0000 Encounter for general adult medical examination without abnormal findings: Secondary | ICD-10-CM
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
## 2021-12-09 NOTE — Patient Instructions (Signed)
[name] , Thank you for taking time to come for your Medicare Wellness Visit. I appreciate your ongoing commitment to your health goals. Please review the following plan we discussed and let me know if I can assist you in the future.   Screening recommendations/referrals: Colonoscopy: Done 11/12/2019 - no repeat Recommended yearly ophthalmology/optometry visit for glaucoma screening and checkup Recommended yearly dental visit for hygiene and checkup  Vaccinations: Influenza vaccine: Done 10/08/2021 - Repeat annually  Pneumococcal vaccine: Done 03/19/2009 & 02/12/2014 Tdap vaccine: Done 01/15/2020 - Repeat in 10 years  Shingles vaccine: Due   Covid-19: Done 12/29/2019, 01/29/2020, & 10/21/2020  Advanced directives: Please bring a copy of your health care power of attorney and living will to the office to be added to your chart at your convenience.   Conditions/risks identified: Aim for 30 minutes of exercise or brisk walking each day, drink 6-8 glasses of water and eat lots of fruits and vegetables.   Next appointment: Follow up in one year for your annual wellness visit.   Preventive Care 12 Years and Older, Male  Preventive care refers to lifestyle choices and visits with your health care provider that can promote health and wellness. What does preventive care include? A yearly physical exam. This is also called an annual well check. Dental exams once or twice a year. Routine eye exams. Ask your health care provider how often you should have your eyes checked. Personal lifestyle choices, including: Daily care of your teeth and gums. Regular physical activity. Eating a healthy diet. Avoiding tobacco and drug use. Limiting alcohol use. Practicing safe sex. Taking low doses of aspirin every day. Taking vitamin and mineral supplements as recommended by your health care provider. What happens during an annual well check? The services and screenings done by your health care provider  during your annual well check will depend on your age, overall health, lifestyle risk factors, and family history of disease. Counseling  Your health care provider may ask you questions about your: Alcohol use. Tobacco use. Drug use. Emotional well-being. Home and relationship well-being. Sexual activity. Eating habits. History of falls. Memory and ability to understand (cognition). Work and work Statistician. Screening  You may have the following tests or measurements: Height, weight, and BMI. Blood pressure. Lipid and cholesterol levels. These may be checked every 5 years, or more frequently if you are over 39 years old. Skin check. Lung cancer screening. You may have this screening every year starting at age 89 if you have a 30-pack-year history of smoking and currently smoke or have quit within the past 15 years. Fecal occult blood test (FOBT) of the stool. You may have this test every year starting at age 56. Flexible sigmoidoscopy or colonoscopy. You may have a sigmoidoscopy every 5 years or a colonoscopy every 10 years starting at age 109. Prostate cancer screening. Recommendations will vary depending on your family history and other risks. Hepatitis C blood test. Hepatitis B blood test. Sexually transmitted disease (STD) testing. Diabetes screening. This is done by checking your blood sugar (glucose) after you have not eaten for a while (fasting). You may have this done every 1-3 years. Abdominal aortic aneurysm (AAA) screening. You may need this if you are a current or former smoker. Osteoporosis. You may be screened starting at age 63 if you are at high risk. Talk with your health care provider about your test results, treatment options, and if necessary, the need for more tests. Vaccines  Your health care provider may  recommend certain vaccines, such as: Influenza vaccine. This is recommended every year. Tetanus, diphtheria, and acellular pertussis (Tdap, Td) vaccine. You  may need a Td booster every 10 years. Zoster vaccine. You may need this after age 88. Pneumococcal 13-valent conjugate (PCV13) vaccine. One dose is recommended after age 66. Pneumococcal polysaccharide (PPSV23) vaccine. One dose is recommended after age 17. Talk to your health care provider about which screenings and vaccines you need and how often you need them. This information is not intended to replace advice given to you by your health care provider. Make sure you discuss any questions you have with your health care provider. Document Released: 12/18/2015 Document Revised: 08/10/2016 Document Reviewed: 09/22/2015 Elsevier Interactive Patient Education  2017 [employer] Prevention in the Home Falls can cause injuries. They can happen to people of all ages. There are many things you can do to make your home safe and to help prevent falls. What can I do on the outside of my home? Regularly fix the edges of walkways and driveways and fix any cracks. Remove anything that might make you trip as you walk through a door, such as a raised step or threshold. Trim any bushes or trees on the path to your home. Use bright outdoor lighting. Clear any walking paths of anything that might make someone trip, such as rocks or tools. Regularly check to see if handrails are loose or broken. Make sure that both sides of any steps have handrails. Any raised decks and porches should have guardrails on the edges. Have any leaves, snow, or ice cleared regularly. Use sand or salt on walking paths during winter. Clean up any spills in your garage right away. This includes oil or grease spills. What can I do in the bathroom? Use night lights. Install grab bars by the toilet and in the tub and shower. Do not use towel bars as grab bars. Use non-skid mats or decals in the tub or shower. If you need to sit down in the shower, use a plastic, non-slip stool. Keep the floor dry. Clean up any water that spills  on the floor as soon as it happens. Remove soap buildup in the tub or shower regularly. Attach bath mats securely with double-sided non-slip rug tape. Do not have throw rugs and other things on the floor that can make you trip. What can I do in the bedroom? Use night lights. Make sure that you have a light by your bed that is easy to reach. Do not use any sheets or blankets that are too big for your bed. They should not hang down onto the floor. Have a firm chair that has side arms. You can use this for support while you get dressed. Do not have throw rugs and other things on the floor that can make you trip. What can I do in the kitchen? Clean up any spills right away. Avoid walking on wet floors. Keep items that you use a lot in easy-to-reach places. If you need to reach something above you, use a strong step stool that has a grab bar. Keep electrical cords out of the way. Do not use floor polish or wax that makes floors slippery. If you must use wax, use non-skid floor wax. Do not have throw rugs and other things on the floor that can make you trip. What can I do with my stairs? Do not leave any items on the stairs. Make sure that there are handrails on both sides of  the stairs and use them. Fix handrails that are broken or loose. Make sure that handrails are as long as the stairways. Check any carpeting to make sure that it is firmly attached to the stairs. Fix any carpet that is loose or worn. Avoid having throw rugs at the top or bottom of the stairs. If you do have throw rugs, attach them to the floor with carpet tape. Make sure that you have a light switch at the top of the stairs and the bottom of the stairs. If you do not have them, ask someone to add them for you. What else can I do to help prevent falls? Wear shoes that: Do not have high heels. Have rubber bottoms. Are comfortable and fit you well. Are closed at the toe. Do not wear sandals. If you use a stepladder: Make  sure that it is fully opened. Do not climb a closed stepladder. Make sure that both sides of the stepladder are locked into place. Ask someone to hold it for you, if possible. Clearly mark and make sure that you can see: Any grab bars or handrails. First and last steps. Where the edge of each step is. Use tools that help you move around (mobility aids) if they are needed. These include: Canes. Walkers. Scooters. Crutches. Turn on the lights when you go into a dark area. Replace any light bulbs as soon as they burn out. Set up your furniture so you have a clear path. Avoid moving your furniture around. If any of your floors are uneven, fix them. If there are any pets around you, be aware of where they are. Review your medicines with your doctor. Some medicines can make you feel dizzy. This can increase your chance of falling. Ask your doctor what other things that you can do to help prevent falls. This information is not intended to replace advice given to you by your health care provider. Make sure you discuss any questions you have with your health care provider. Document Released: 09/17/2009 Document Revised: 04/28/2016 Document Reviewed: 12/26/2014 Elsevier Interactive Patient Education  2017 [employer].

## 2022-02-09 DIAGNOSIS — N5201 Erectile dysfunction due to arterial insufficiency: Secondary | ICD-10-CM | POA: Diagnosis not present

## 2022-02-09 DIAGNOSIS — R351 Nocturia: Secondary | ICD-10-CM | POA: Diagnosis not present

## 2022-02-09 DIAGNOSIS — N401 Enlarged prostate with lower urinary tract symptoms: Secondary | ICD-10-CM | POA: Diagnosis not present

## 2022-02-09 DIAGNOSIS — R3915 Urgency of urination: Secondary | ICD-10-CM | POA: Diagnosis not present

## 2022-02-16 IMAGING — CR DG CHEST 2V
2 series · 2 of 2 positions shown · non-contrast
Comparison: 10/05/2017

CLINICAL DATA: Lumbar stenosis

EXAM:
CHEST - 2 VIEW

[w chest pa]
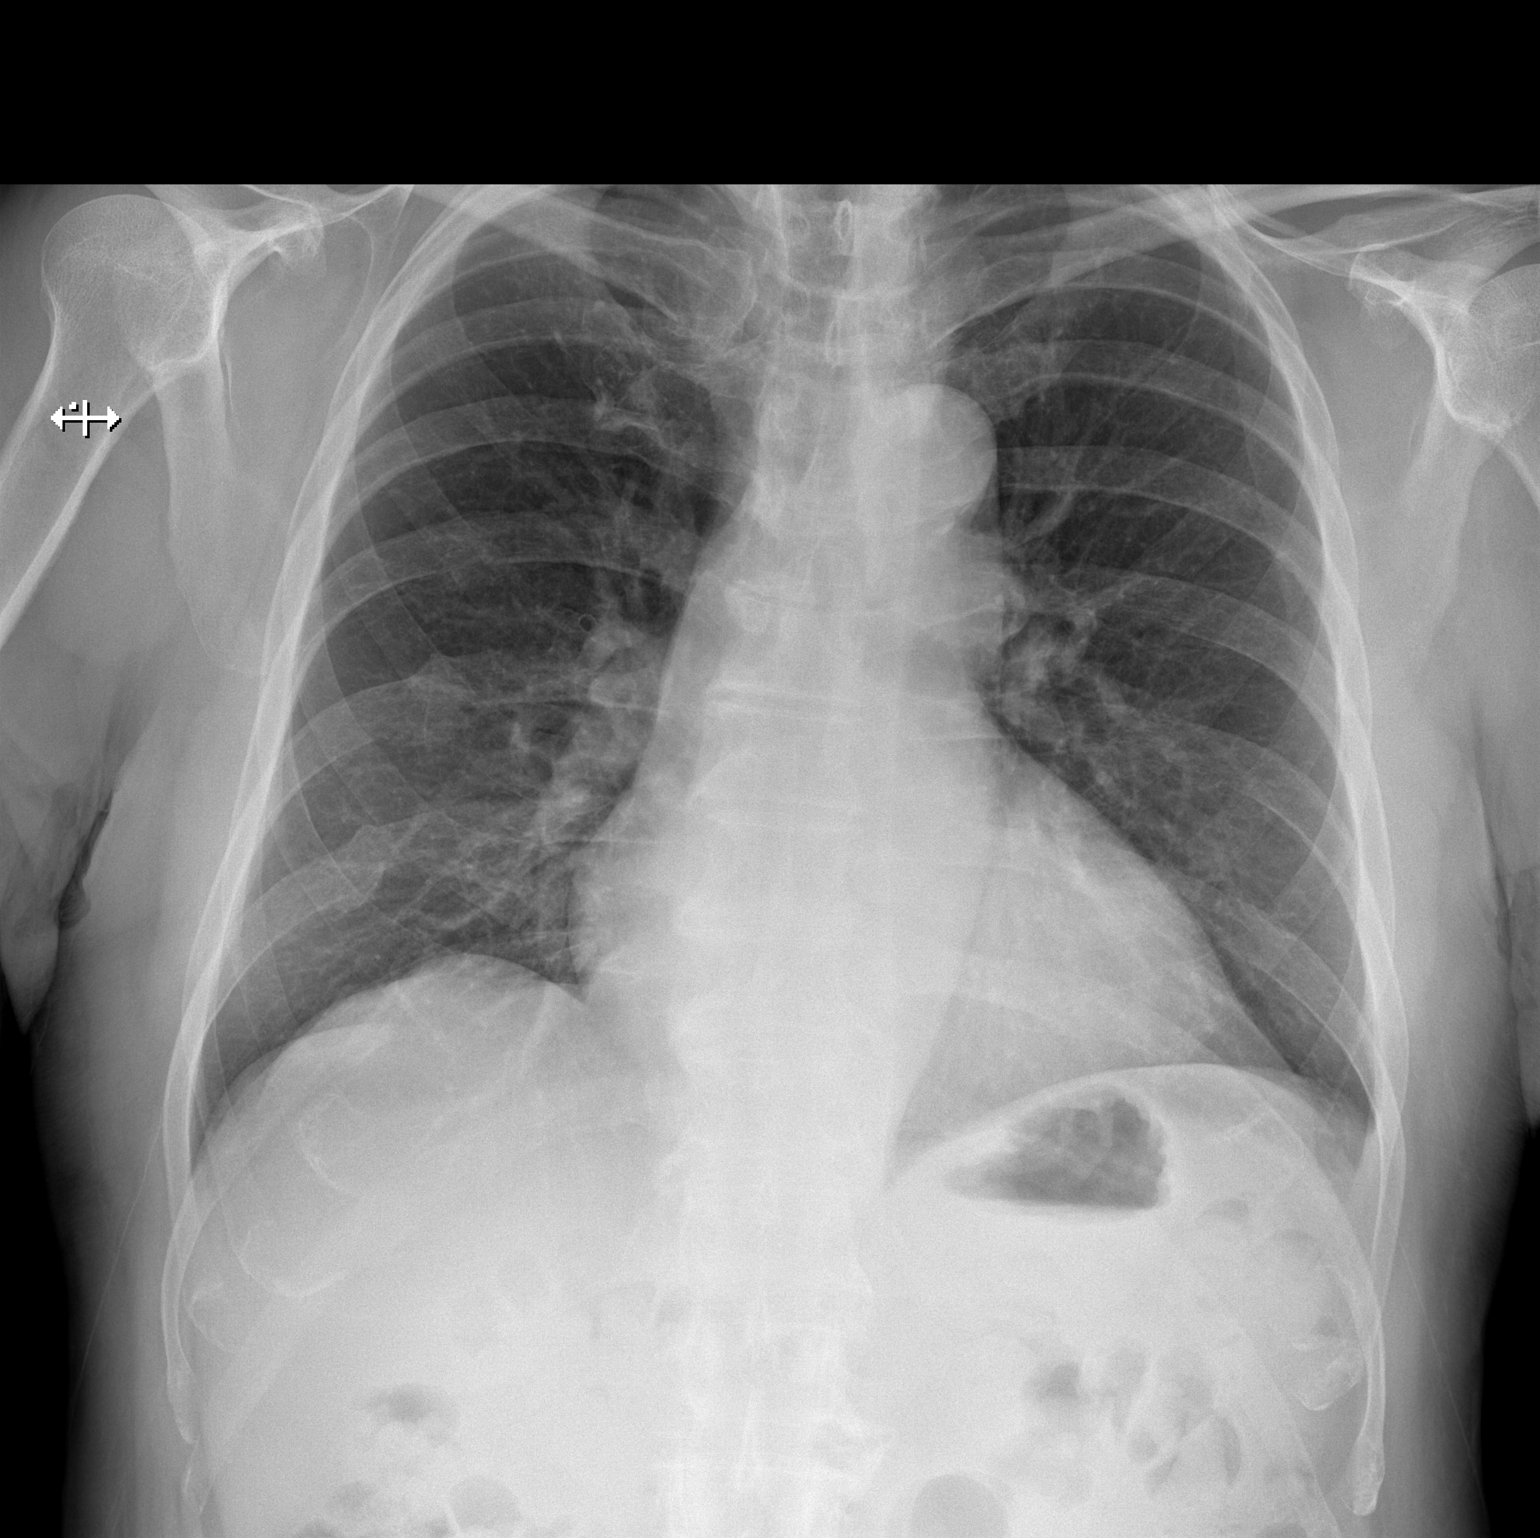

[w chest lat]
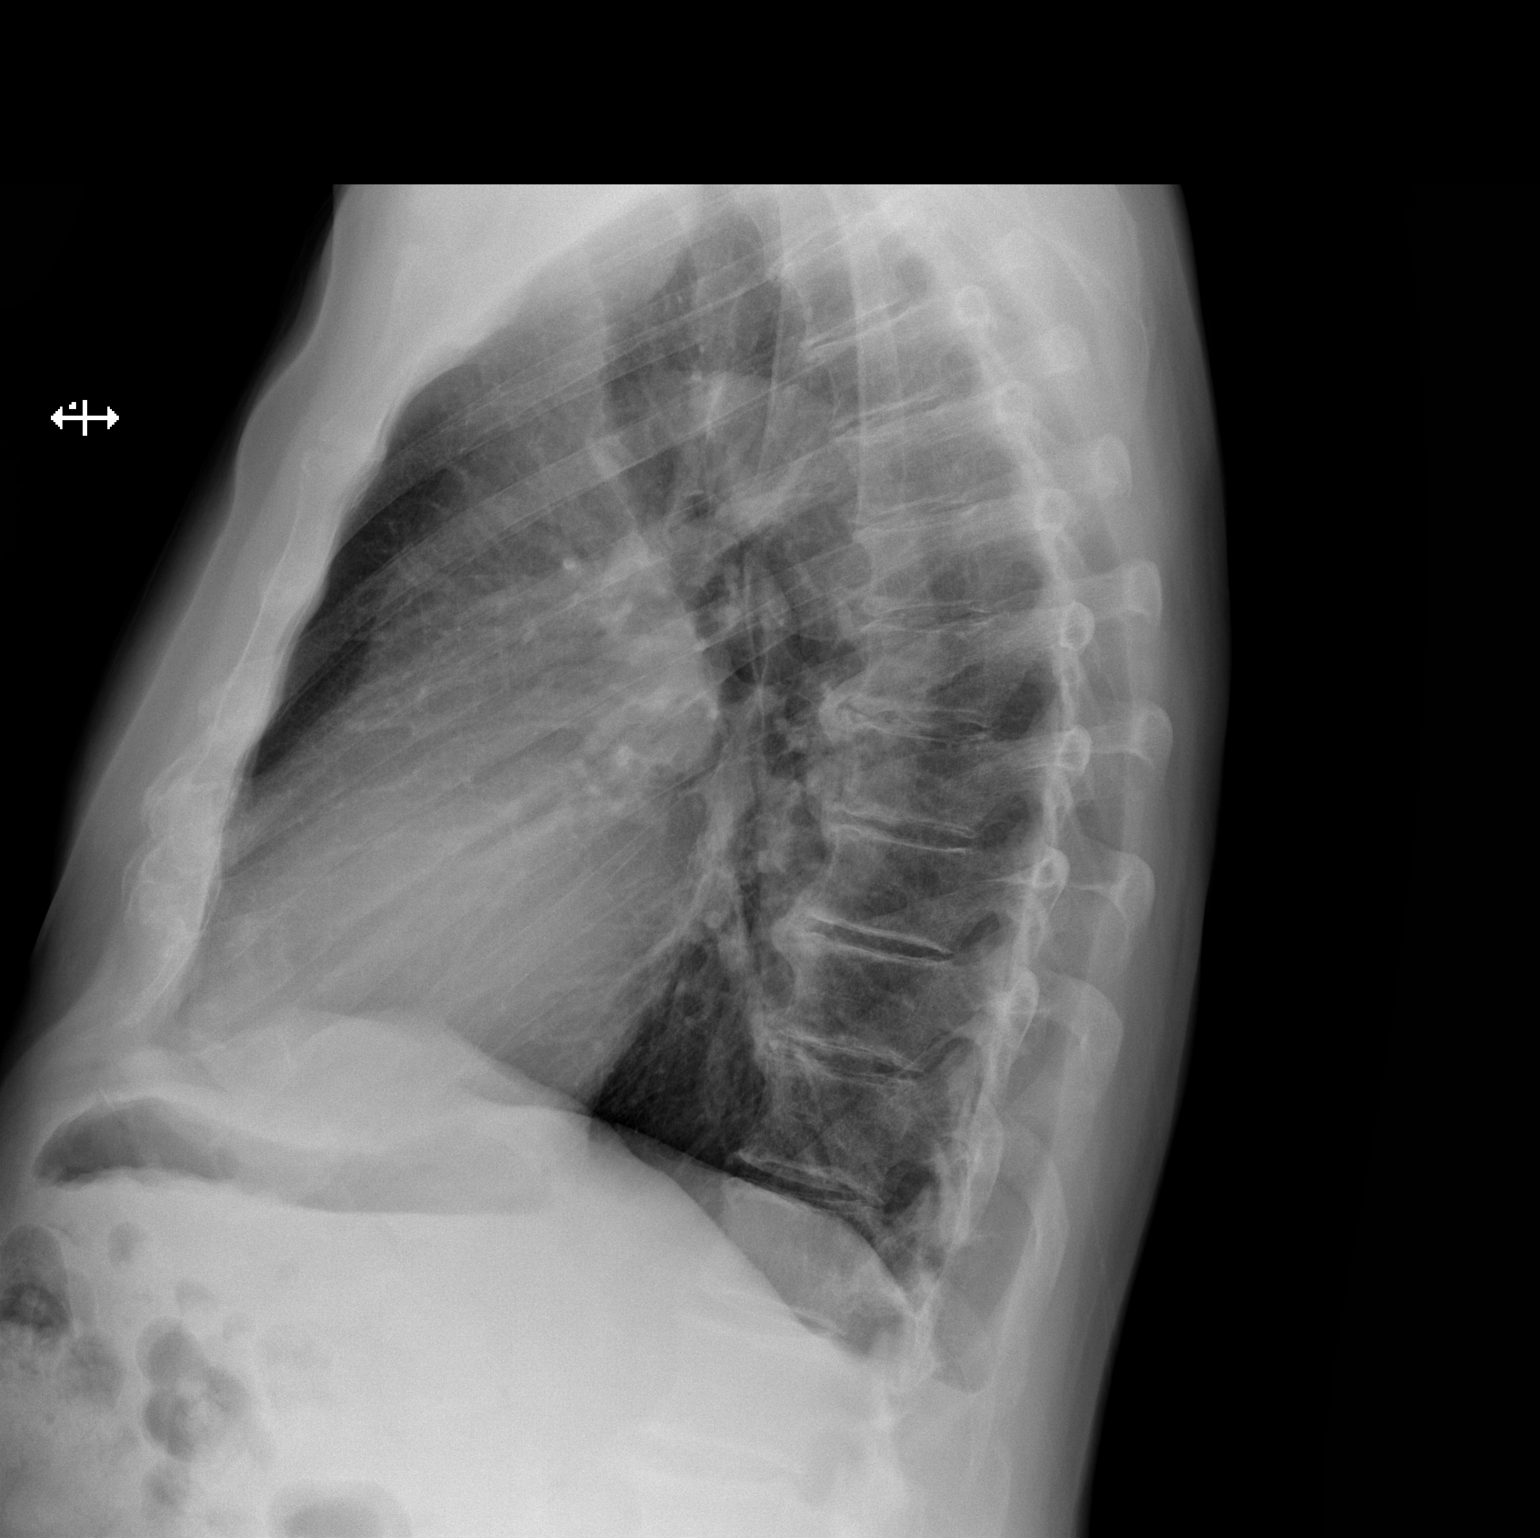

[2 of 2 positions shown; findings below may reference images not displayed]

FINDINGS: Cardiac shadow is mildly enlarged. Aortic calcifications are noted.
The lungs are well aerated bilaterally. No focal infiltrate or
effusion is seen. Degenerative changes of the thoracic spine are
noted. Old rib fractures are again seen on the right
IMPRESSION: No active cardiopulmonary disease.

## 2022-02-18 IMAGING — CR DG LUMBAR SPINE 2-3V
2 series · 2 of 2 positions shown · non-contrast
Comparison: Lumbar MRI August 17, 2020

CLINICAL DATA: Intraoperative images for localization

EXAM:
LUMBAR SPINE - 2-3 VIEW

[lateral (1 of 2)]
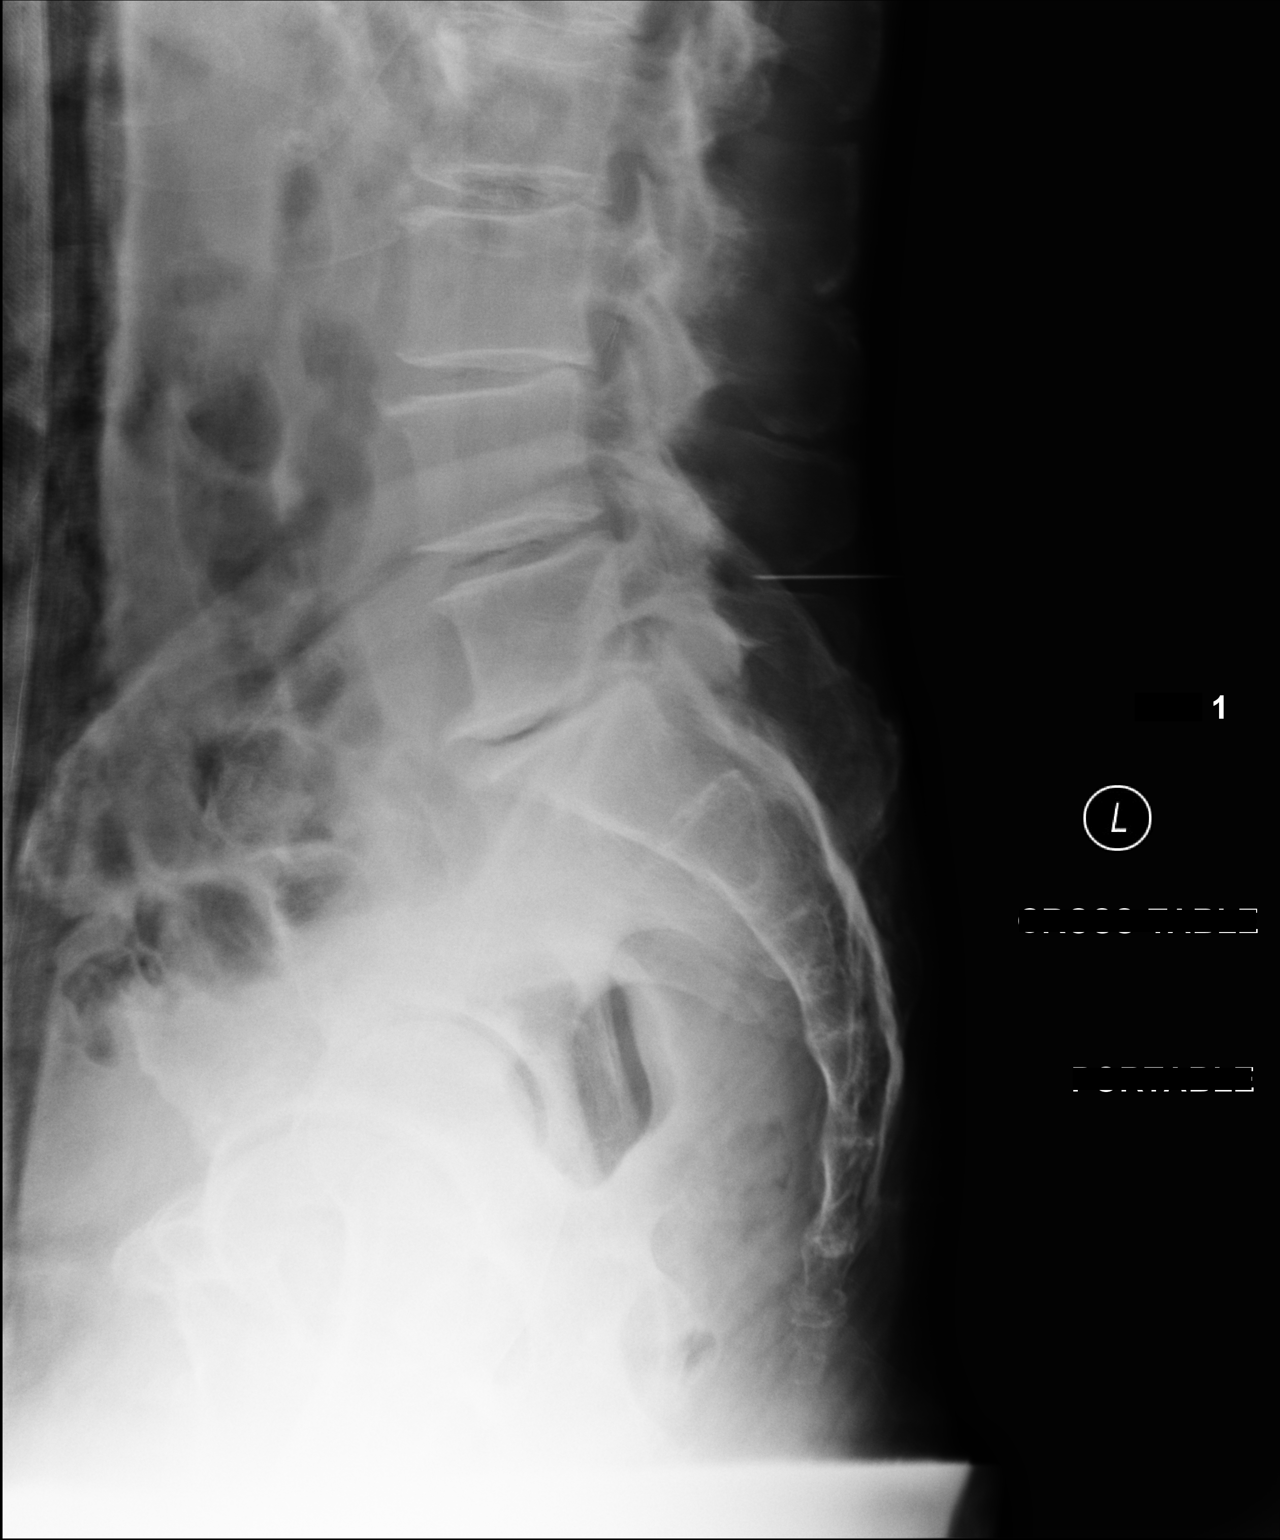

[lateral (2 of 2)]
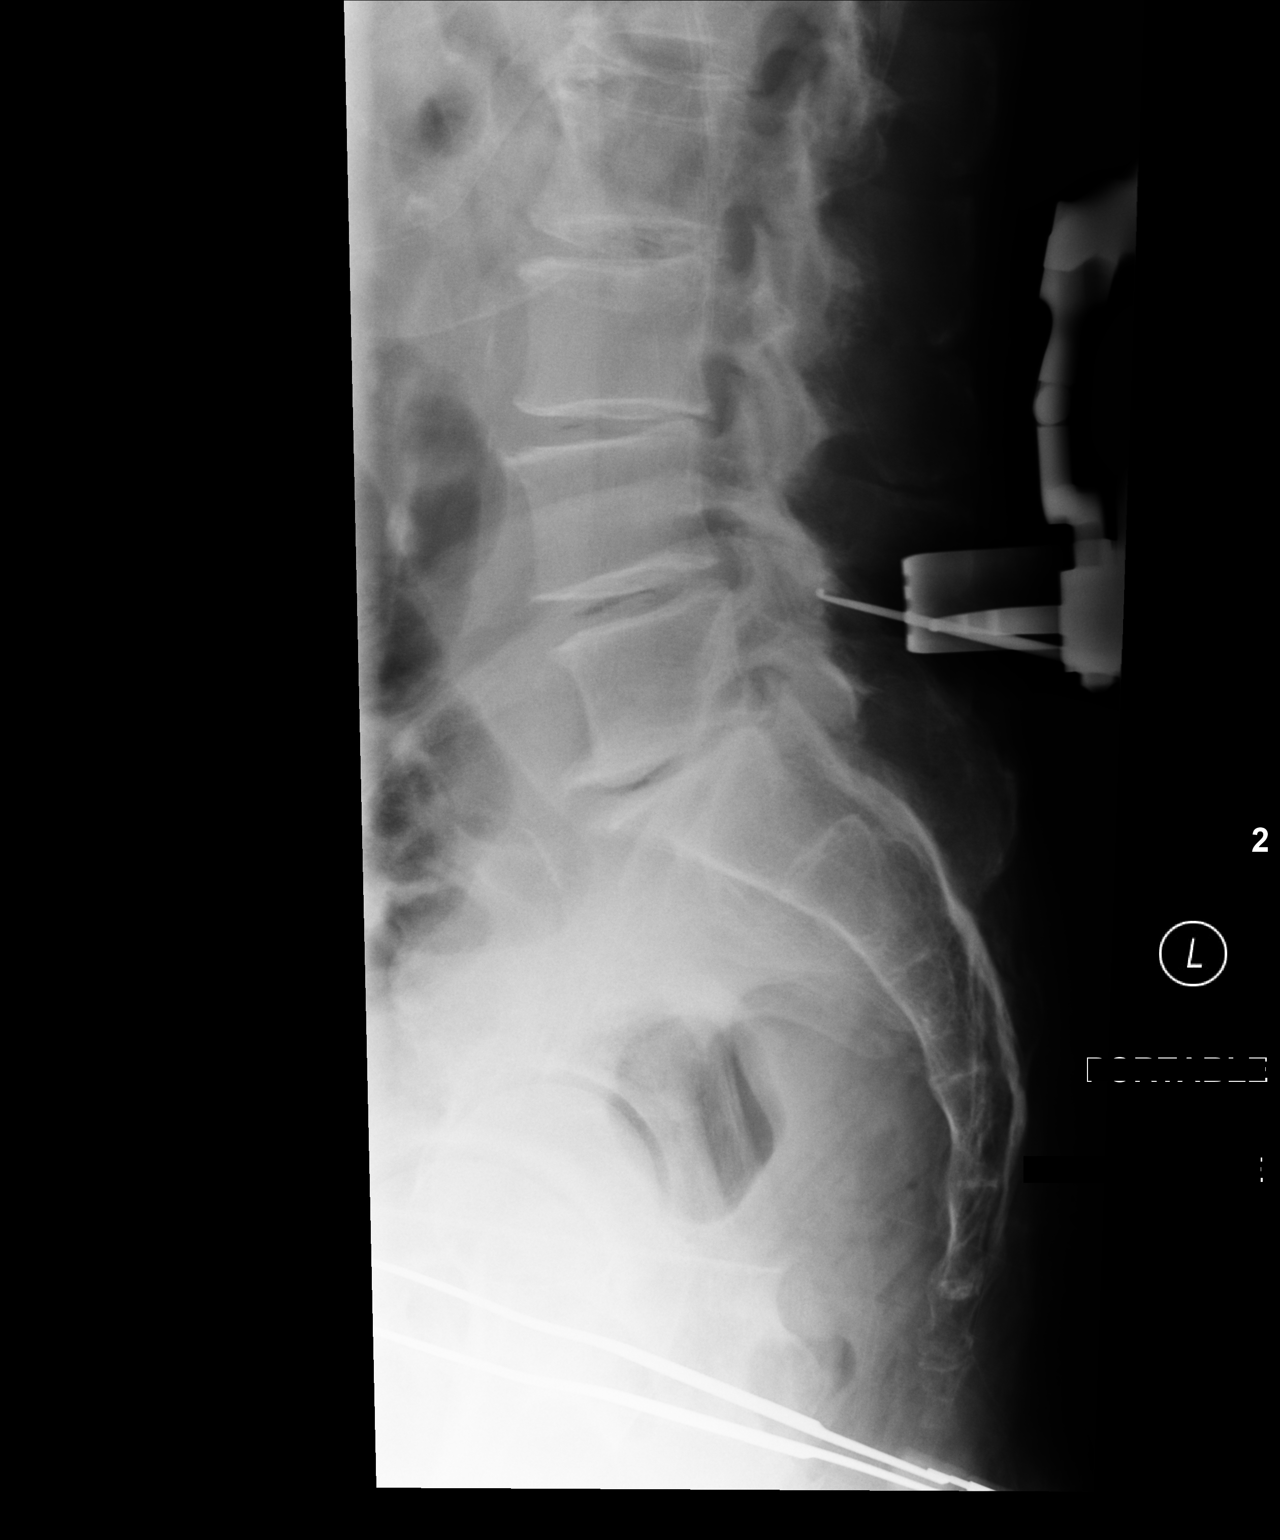

[2 of 2 positions shown; findings below may reference images not displayed]

FINDINGS: Cross-table lateral image labeled film 1 submitted. Metallic probe
tip is posterior to the midportion of the L5 vertebral body on this
image. There is moderate disc space narrowing at all visualized
levels. No fracture or spondylolisthesis.

Cross-table lateral lumbar image labeled film 2 submitted. On this
image, metallic probe tip is posterior to the L4-5 interspace with
cutting tool located between the L4 and L5 spinous processes.
Osteoarthritic change noted at all levels. No fracture or
spondylolisthesis.
IMPRESSION: On final submitted cross-table lateral lumbar image, time stamped
[DATE] and labeled film 2, metallic probe tip is posterior to the
L4-5 interspace. Cutting tool between L4 and L5 spinous processes.
Multilevel osteoarthritic change noted. No fracture or
spondylolisthesis.

## 2022-02-21 DIAGNOSIS — H401112 Primary open-angle glaucoma, right eye, moderate stage: Secondary | ICD-10-CM | POA: Diagnosis not present

## 2022-02-21 DIAGNOSIS — H401123 Primary open-angle glaucoma, left eye, severe stage: Secondary | ICD-10-CM | POA: Diagnosis not present

## 2022-03-16 ENCOUNTER — Encounter: Payer: Self-pay | Admitting: Family Medicine

## 2022-03-16 ENCOUNTER — Ambulatory Visit: Payer: Medicare PPO | Admitting: Family Medicine

## 2022-03-16 DIAGNOSIS — R509 Fever, unspecified: Secondary | ICD-10-CM | POA: Diagnosis not present

## 2022-03-16 DIAGNOSIS — J069 Acute upper respiratory infection, unspecified: Secondary | ICD-10-CM | POA: Diagnosis not present

## 2022-03-16 DIAGNOSIS — M791 Myalgia, unspecified site: Secondary | ICD-10-CM

## 2022-03-16 LAB — VERITOR FLU A/B WAIVED
Influenza A: NEGATIVE
Influenza B: NEGATIVE

## 2022-03-16 NOTE — Patient Instructions (Signed)
It appears that you have a viral upper respiratory infection (cold).  Cold symptoms can last up to 2 weeks.   ° °- Get plenty of rest and drink plenty of fluids. °- Try to breathe moist air. Use a cold mist humidifier. °- Consume warm fluids (soup or tea) to provide relief for a stuffy nose and to loosen phlegm. °- For cough and congestion you can use plain Mucinex, regular or max strength, follow box directions.  °- For nasal stuffiness, try saline nasal spray or a Neti Pot. You can use saline nasal spray 4 times daily. Do not use tap water in the Neti Pot, follow instructions on box for proper use. Afrin nasal spray can also be used but this product should not be used longer than 3 days or it will cause rebound nasal stuffiness (worsening nasal congestion). °- For sore throat pain relief: use chloraseptic spray, suck on throat lozenges, hard candy or popsicles; gargle with warm salt water (1/4 tsp. salt per 8 oz. of water); and eat soft, bland foods. °- For fever or aches and pains take tylenol or motrin as appropriate for age and weight.  °- Eat a well-balanced diet. If you cannot, ensure you are getting enough nutrients by taking a daily multivitamin. °- Avoid dairy products, as they can thicken phlegm. °- Avoid alcohol, as it impairs your body’s immune system. °- Change your toothbrush in 3 days.  ° °CONTACT YOUR DOCTOR IF YOU EXPERIENCE ANY OF THE FOLLOWING: °- High fever °- Ear pain °- Sinus-type headache °- Unusually severe cold symptoms °- Cough that gets worse while other cold symptoms improve °- Flare up of any chronic lung problem, such as asthma or COPD °- Your symptoms persist longer than 2 weeks ° °

## 2022-03-16 NOTE — Progress Notes (Signed)
? ?Virtual Visit via telephone Note ?Due to COVID-19 pandemic this visit was conducted virtually. This visit type was conducted due to national recommendations for restrictions regarding the COVID-19 Pandemic (e.g. social distancing, sheltering in place) in an effort to limit this patient's exposure and mitigate transmission in our community. All issues noted in this document were discussed and addressed.  A physical exam was not performed with this format.  ? ?I connected with Paul Parks on 03/16/2022 at 1122 by telephone and verified that I am speaking with the correct person using two identifiers. Paul Parks is currently located at home and family is currently with them during visit. The provider, Monia Pouch, FNP is located in their office at time of visit. ? ?I discussed the limitations, risks, security and privacy concerns of performing an evaluation and management service by virtual visit and the availability of in person appointments. I also discussed with the patient that there may be a patient responsible charge related to this service. The patient expressed understanding and agreed to proceed. ? ?Subjective:  ?Patient ID: Paul Parks, male    DOB: 10-Jul-1936, 86 y.o.   MRN: [id number] ? ?Chief Complaint:  URI ? ? ?HPI: ?Paul Parks is a 86 y.o. male presenting on 03/16/2022 for URI ? ? ?Pt reports fever, chills, cough, congestion, malaise, and decreased appetite since Monday. States he was in a large crowd and feels he may have influenza or COVID. He has been taking tylenol and motrin with some relief of symptoms. No confusion, weakness, decreased urine output, chest pain, shortness of breath, or syncope.  ? ?URI  ?This is a new problem. The current episode started in the past 7 days. The problem has been waxing and waning. The maximum temperature recorded prior to his arrival was 102 - 102.9 F. Associated symptoms include congestion, coughing, headaches, rhinorrhea and a sore throat.  Pertinent negatives include no abdominal pain, chest pain, diarrhea, dysuria, ear pain, joint pain, joint swelling, nausea, neck pain, plugged ear sensation, rash, sinus pain, sneezing, swollen glands, vomiting or wheezing. He has tried acetaminophen and NSAIDs for the symptoms. The treatment provided mild relief.  ? ? ?Relevant past medical, surgical, family, and social history reviewed and updated as indicated.  ?Allergies and medications reviewed and updated. ? ? ?Past Medical History:  ?Diagnosis Date  ? Allergic rhinitis   ? Allergy   ? Aortic atherosclerosis ([place])   ? Bladder disorder   ? takes bladder control meds  ? BPH (benign prostatic hyperplasia)   ? Cataract   ? DDD (degenerative disc disease)   ? Diverticulosis   ? Glaucoma   ? Hyperlipemia   ? Hyperlipidemia   ? Hypertension   ? Labyrinthitis   ? Personal history of colonic polyps   ? Sinus node dysfunction ([facility])   ? Skull fracture ([place]) 1955  ? from [place]   ? Thrombocytopenia ([place])   ? Tubular adenoma of colon   ? Vitamin D deficiency   ? ? ?Past Surgical History:  ?Procedure Laterality Date  ? COLON SURGERY    ? polyps   ? COLONOSCOPY    ? EYE SURGERY Bilateral   ? cataracts   ? left knee surgery- microscopic  Left 80's  ? [facility]   ? LUMBAR LAMINECTOMY/DECOMPRESSION MICRODISCECTOMY Right 01/13/2021  ? Procedure: Laminectomy and Foraminotomy - Lumbar four-Lumbar five - right;  Surgeon: Eustace Moore, MD;  Location: [facility];  Service: Neurosurgery;  Laterality: Right;  ? PVC ABLATION N/A 08/19/2021  ?  Procedure: PVC ABLATION;  Surgeon: Evans Lance, MD;  Location: [facility];  Service: Cardiovascular;  Laterality: N/A;  ? SKIN LESION EXCISION  2017  ? 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 [place]    ? ? ?Social History  ? ?Socioeconomic History  ? Marital status: Married  ?  Spouse name: [name]  ? Number of children: 2  ? Years of education: Not on file  ? Highest education level: Not on file  ?Occupational  History  ? Occupation: retired   ?  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 Smoking status: Never  ? Smokeless tobacco: Never  ?Vaping Use  ? Vaping Use: Never used  ?Substance and Sexual Activity  ? Alcohol use: Not Currently  ?  Alcohol/week: 0.0 standard drinks  ?  Comment: every 2 months 1-2 beers                            ? Drug use: No  ? Sexual activity: Yes  ?Other Topics Concern  ? Not on file  ?Social History Narrative  ? Lives at home with wife, [name]   ? Son lives in [place]  ? ?Social Determinants of Health  ? ?Financial Resource Strain: Low Risk   ? Difficulty of Paying Living Expenses: Not hard at all  ?Food Insecurity: No Food Insecurity  ? Worried About Charity fundraiser in the Last Year: Never true  ? Ran Out of Food in the Last Year: Never true  ?Transportation Needs: No Transportation Needs  ? Lack of Transportation (Medical): No  ? Lack of Transportation (Non-Medical): No  ?Physical Activity: Sufficiently Active  ? Days of Exercise per Week: 7 days  ? Minutes of Exercise per Session: 30 min  ?Stress: No Stress Concern Present  ? Feeling of Stress : Not at all  ?Social Connections: Socially Integrated  ? Frequency of Communication with Friends and Family: More than three times a week  ? Frequency of Social Gatherings with Friends and Family: More than three times a week  ? Attends Religious Services: More than 4 times per year  ? Active Member of Clubs or Organizations: Yes  ? Attends Archivist Meetings: More than 4 times per year  ? Marital Status: Married  ?Intimate Partner Violence: Not At Risk  ? Fear of Current or Ex-Partner: No  ? Emotionally Abused: No  ? Physically Abused: No  ? Sexually Abused: No  ? ? ?Outpatient Encounter Medications as of 03/16/2022  ?Medication Sig  ? alfuzosin (UROXATRAL) 10 MG 24 hr tablet Take 10 mg by mouth daily.   ? amLODipine (NORVASC) 5 MG tablet Take 1 tablet (5 mg total) by mouth daily.  ? aspirin 81 MG  tablet Take 81 mg by mouth daily.  ? cholecalciferol (VITAMIN D) 1000 UNITS tablet Take 1,000 Units by mouth daily.  ? fluticasone (FLONASE) 50 MCG/ACT nasal spray Place 1 spray into both nostrils daily as needed for allergies.  ? meclizine (ANTIVERT) 12.5 MG tablet Take 12.5 mg by mouth 4 (four) times daily as needed. Take one (1) tablet (12.5 mg) by mouth four (4) times daily with meals and at bedtime for dizziness as needed  ? meloxicam (MOBIC) 15 MG tablet TAKE 1/2 TO 1 TABLET BY MOUTH EVERY DAY AFTER A MEAL. (Patient taking differently: Take 7.5-15 mg by mouth daily as  needed for pain.)  ? Multiple Vitamin (MULTIVITAMIN WITH MINERALS) TABS tablet Take 1 tablet by mouth daily.  ? rosuvastatin (CRESTOR) 10 MG tablet TAKE 1 TABLET BY MOUTH 2 TIMES A WEEK (Patient taking differently: Take 10 mg by mouth 2 (two) times a week. TAKE 1 TABLET BY MOUTH 2 TIMES A WEEK)  ? tadalafil (CIALIS) 10 MG tablet Take 1 tablet (10 mg total) by mouth daily as needed for erectile dysfunction.  ? TRAVATAN Z 0.004 % SOLN ophthalmic solution Place 1 drop into both eyes at bedtime.   ? ?No facility-administered encounter medications on file as of 03/16/2022.  ? ? ?Allergies  ?Allergen Reactions  ? Sulfa Antibiotics Itching  ?  Rash   ? Ibuprofen Swelling  ? Gabapentin   ?  Swelling, itching  ? Vesicare [Solifenacin] Other (See Comments)  ?  HA   ? ? ?Review of Systems  ?Constitutional:  Positive for activity change, appetite change, chills, fatigue and fever. Negative for diaphoresis and unexpected weight change.  ?HENT:  Positive for congestion, rhinorrhea and sore throat. Negative for dental problem, drooling, ear discharge, ear pain, facial swelling, hearing loss, mouth sores, nosebleeds, postnasal drip, sinus pressure, sinus pain, sneezing, tinnitus, trouble swallowing and voice change.   ?Eyes:  Negative for photophobia.  ?Respiratory:  Positive for cough. Negative for apnea, choking, chest tightness, shortness of breath, wheezing  and stridor.   ?Cardiovascular:  Negative for chest pain, palpitations and leg swelling.  ?Gastrointestinal:  Negative for abdominal pain, diarrhea, nausea and vomiting.  ?Genitourinary:  Negative for decreased

## 2022-03-17 ENCOUNTER — Telehealth: Payer: Self-pay | Admitting: Family Medicine

## 2022-03-17 DIAGNOSIS — U071 COVID-19: Secondary | ICD-10-CM

## 2022-03-17 LAB — NOVEL CORONAVIRUS, NAA: SARS-CoV-2, NAA: DETECTED — AB

## 2022-03-17 MED ORDER — MOLNUPIRAVIR EUA 200MG CAPSULE: 4.0000 | ORAL_CAPSULE | Freq: Two times a day (BID) | ORAL | 0 refills | Status: AC

## 2022-03-17 NOTE — Telephone Encounter (Signed)
Pt called stating that he saw that he tested positive for COVID. Says he has been taking OTC meds like nyquil and dayquil and tylenol for fever but says he isnt feeling any better. Needs medicine sent in for him. [facility] [place].  ?

## 2022-03-17 NOTE — Telephone Encounter (Signed)
[facility]avir [facility] to [facility].  Take 4 capsules twice daily for 5 days.  Isolate at home. Mask for 5 days after completion of antiviral if going out in public or around other people.  If develops any chest pain, shortness of breath, excessive malaise, inability to stay hydrated, high fevers that will not go down with Tylenol he is to seek immediate medical attention ? ?Meds ordered this encounter  ?Medications  ? molnupiravir EUA (LAGEVRIO) 200 mg CAPS capsule  ?  Sig: Take 4 capsules (800 mg total) by mouth 2 (two) times daily for 5 days.  ?  Dispense:  40 capsule  ?  Refill:  0  ? ? ?

## 2022-03-18 NOTE — Telephone Encounter (Signed)
Wife aware and verbalizes understanding per dpr.  °

## 2022-03-18 NOTE — Telephone Encounter (Signed)
Lmtcb.

## 2022-04-05 DIAGNOSIS — H401123 Primary open-angle glaucoma, left eye, severe stage: Secondary | ICD-10-CM | POA: Diagnosis not present

## 2022-04-05 DIAGNOSIS — H401112 Primary open-angle glaucoma, right eye, moderate stage: Secondary | ICD-10-CM | POA: Diagnosis not present

## 2022-04-05 DIAGNOSIS — Z961 Presence of intraocular lens: Secondary | ICD-10-CM | POA: Diagnosis not present

## 2022-04-25 DIAGNOSIS — R1900 Intra-abdominal and pelvic swelling, mass and lump, unspecified site: Secondary | ICD-10-CM | POA: Diagnosis not present

## 2022-04-25 DIAGNOSIS — R350 Frequency of micturition: Secondary | ICD-10-CM | POA: Diagnosis not present

## 2022-04-25 DIAGNOSIS — N401 Enlarged prostate with lower urinary tract symptoms: Secondary | ICD-10-CM | POA: Diagnosis not present

## 2022-04-25 DIAGNOSIS — R82998 Other abnormal findings in urine: Secondary | ICD-10-CM | POA: Diagnosis not present

## 2022-04-26 DIAGNOSIS — H401112 Primary open-angle glaucoma, right eye, moderate stage: Secondary | ICD-10-CM | POA: Diagnosis not present

## 2022-04-26 DIAGNOSIS — H5201 Hypermetropia, right eye: Secondary | ICD-10-CM | POA: Diagnosis not present

## 2022-04-26 DIAGNOSIS — H401123 Primary open-angle glaucoma, left eye, severe stage: Secondary | ICD-10-CM | POA: Diagnosis not present

## 2022-05-03 DIAGNOSIS — M5416 Radiculopathy, lumbar region: Secondary | ICD-10-CM | POA: Diagnosis not present

## 2022-05-03 DIAGNOSIS — Z6826 Body mass index (BMI) 26.0-26.9, adult: Secondary | ICD-10-CM | POA: Diagnosis not present

## 2022-05-05 DIAGNOSIS — M5416 Radiculopathy, lumbar region: Secondary | ICD-10-CM | POA: Diagnosis not present

## 2022-05-05 DIAGNOSIS — M25552 Pain in left hip: Secondary | ICD-10-CM | POA: Diagnosis not present

## 2022-05-05 DIAGNOSIS — M4316 Spondylolisthesis, lumbar region: Secondary | ICD-10-CM | POA: Diagnosis not present

## 2022-05-05 DIAGNOSIS — M545 Low back pain, unspecified: Secondary | ICD-10-CM | POA: Diagnosis not present

## 2022-05-10 ENCOUNTER — Other Ambulatory Visit: Payer: Medicare PPO

## 2022-05-10 ENCOUNTER — Other Ambulatory Visit: Payer: Self-pay

## 2022-05-10 DIAGNOSIS — I10 Essential (primary) hypertension: Secondary | ICD-10-CM | POA: Diagnosis not present

## 2022-05-11 ENCOUNTER — Telehealth: Payer: Self-pay | Admitting: Hematology

## 2022-05-11 ENCOUNTER — Encounter: Payer: Self-pay | Admitting: Family Medicine

## 2022-05-11 ENCOUNTER — Ambulatory Visit: Payer: Medicare PPO | Admitting: Family Medicine

## 2022-05-11 VITALS — BP 106/51 | HR 39 | Temp 97.6°F | Ht 70.0 in | Wt 182.8 lb

## 2022-05-11 DIAGNOSIS — W57XXXS Bitten or stung by nonvenomous insect and other nonvenomous arthropods, sequela: Secondary | ICD-10-CM | POA: Diagnosis not present

## 2022-05-11 DIAGNOSIS — R001 Bradycardia, unspecified: Secondary | ICD-10-CM | POA: Diagnosis not present

## 2022-05-11 DIAGNOSIS — D696 Thrombocytopenia, unspecified: Secondary | ICD-10-CM | POA: Diagnosis not present

## 2022-05-11 DIAGNOSIS — R0683 Snoring: Secondary | ICD-10-CM

## 2022-05-11 LAB — LIPID PANEL
Chol/HDL Ratio: 2 ratio (ref 0.0–5.0)
Cholesterol, Total: 121 mg/dL (ref 100–199)
HDL: 60 mg/dL (ref >39)
LDL Chol Calc (NIH): 46 mg/dL (ref 0–99)
Triglycerides: 71 mg/dL (ref 0–149)
VLDL Cholesterol Cal: 15 mg/dL (ref 5–40)

## 2022-05-11 LAB — CMP14+EGFR
ALT: 16 IU/L (ref 0–44)
AST: 22 IU/L (ref 0–40)
Albumin/Globulin Ratio: 2.3 — ABNORMAL HIGH (ref 1.2–2.2)
Albumin: 4.2 g/dL (ref 3.6–4.6)
Alkaline Phosphatase: 42 IU/L — ABNORMAL LOW (ref 44–121)
BUN/Creatinine Ratio: 13 (ref 10–24)
BUN: 16 mg/dL (ref 8–27)
Bilirubin Total: 0.7 mg/dL (ref 0.0–1.2)
CO2: 24 mmol/L (ref 20–29)
Calcium: 9 mg/dL (ref 8.6–10.2)
Chloride: 101 mmol/L (ref 96–106)
Creatinine, Ser: 1.2 mg/dL (ref 0.76–1.27)
Globulin, Total: 1.8 g/dL (ref 1.5–4.5)
Glucose: 94 mg/dL (ref 70–99)
Potassium: 4.5 mmol/L (ref 3.5–5.2)
Sodium: 136 mmol/L (ref 134–144)
Total Protein: 6 g/dL (ref 6.0–8.5)
eGFR: 60 mL/min/{1.73_m2} (ref >59)

## 2022-05-11 LAB — CBC WITH DIFFERENTIAL/PLATELET
Basophils Absolute: 0 10*3/uL (ref 0.0–0.2)
Basos: 0 %
EOS (ABSOLUTE): 0.1 10*3/uL (ref 0.0–0.4)
Eos: 2 %
Hematocrit: 38.5 % (ref 37.5–51.0)
Hemoglobin: 13 g/dL (ref 13.0–17.7)
Immature Grans (Abs): 0 10*3/uL (ref 0.0–0.1)
Immature Granulocytes: 0 %
Lymphocytes Absolute: 1.9 10*3/uL (ref 0.7–3.1)
Lymphs: 39 %
MCH: 31.2 pg (ref 26.6–33.0)
MCHC: 33.8 g/dL (ref 31.5–35.7)
MCV: 92 fL (ref 79–97)
Monocytes Absolute: 0.4 10*3/uL (ref 0.1–0.9)
Monocytes: 8 %
Neutrophils Absolute: 2.4 10*3/uL (ref 1.4–7.0)
Neutrophils: 51 %
Platelets: 125 10*3/uL — ABNORMAL LOW (ref 150–450)
RBC: 4.17 x10E6/uL (ref 4.14–5.80)
RDW: 12.8 % (ref 11.6–15.4)
WBC: 4.9 10*3/uL (ref 3.4–10.8)

## 2022-05-11 MED ORDER — MELOXICAM 15 MG PO TABS: 7.5000 mg | ORAL_TABLET | Freq: Every day | ORAL | 3 refills | Status: DC | PRN

## 2022-05-11 MED ORDER — DOXYCYCLINE HYCLATE 100 MG PO TABS: ORAL_TABLET | ORAL | 0 refills | Status: DC
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
## 2022-05-11 NOTE — Telephone Encounter (Signed)
Scheduled appt per 6/7 referral. Pt is aware of appt date and time. Pt is aware to arrive 15 mins prior to appt time and to bring and updated insurance card. Pt is aware of appt location.

## 2022-05-16 ENCOUNTER — Ambulatory Visit: Payer: Medicare PPO | Admitting: Family Medicine

## 2022-05-16 DIAGNOSIS — N401 Enlarged prostate with lower urinary tract symptoms: Secondary | ICD-10-CM | POA: Diagnosis not present

## 2022-05-16 DIAGNOSIS — R350 Frequency of micturition: Secondary | ICD-10-CM | POA: Diagnosis not present

## 2022-05-24 DIAGNOSIS — Z6826 Body mass index (BMI) 26.0-26.9, adult: Secondary | ICD-10-CM | POA: Diagnosis not present

## 2022-05-24 DIAGNOSIS — M5416 Radiculopathy, lumbar region: Secondary | ICD-10-CM | POA: Diagnosis not present
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
## 2022-05-27 ENCOUNTER — Encounter: Payer: Self-pay | Admitting: Student

## 2022-05-27 ENCOUNTER — Ambulatory Visit: Payer: Medicare PPO | Admitting: Student

## 2022-05-27 VITALS — BP 114/60 | HR 54 | Ht 70.5 in | Wt 183.8 lb

## 2022-05-27 DIAGNOSIS — I10 Essential (primary) hypertension: Secondary | ICD-10-CM | POA: Diagnosis not present

## 2022-05-27 DIAGNOSIS — I493 Ventricular premature depolarization: Secondary | ICD-10-CM | POA: Diagnosis not present

## 2022-05-27 DIAGNOSIS — R002 Palpitations: Secondary | ICD-10-CM

## 2022-05-28 LAB — CBC
Hematocrit: 40.1 % (ref 37.5–51.0)
Hemoglobin: 13.4 g/dL (ref 13.0–17.7)
MCH: 31 pg (ref 26.6–33.0)
MCHC: 33.4 g/dL (ref 31.5–35.7)
MCV: 93 fL (ref 79–97)
Platelets: 134 10*3/uL — ABNORMAL LOW (ref 150–450)
RBC: 4.32 x10E6/uL (ref 4.14–5.80)
RDW: 13 % (ref 11.6–15.4)
WBC: 5.5 10*3/uL (ref 3.4–10.8)

## 2022-05-28 LAB — BASIC METABOLIC PANEL
BUN/Creatinine Ratio: 18 (ref 10–24)
BUN: 23 mg/dL (ref 8–27)
CO2: 27 mmol/L (ref 20–29)
Calcium: 9.5 mg/dL (ref 8.6–10.2)
Chloride: 100 mmol/L (ref 96–106)
Creatinine, Ser: 1.26 mg/dL (ref 0.76–1.27)
Glucose: 76 mg/dL (ref 70–99)
Potassium: 4.5 mmol/L (ref 3.5–5.2)
Sodium: 137 mmol/L (ref 134–144)
eGFR: 57 mL/min/{1.73_m2} — ABNORMAL LOW (ref >59)

## 2022-05-31 ENCOUNTER — Other Ambulatory Visit: Payer: Self-pay

## 2022-05-31 ENCOUNTER — Inpatient Hospital Stay: Payer: Medicare PPO | Attending: Hematology | Admitting: Hematology

## 2022-05-31 VITALS — BP 142/50 | HR 40 | Temp 97.7°F | Resp 18 | Wt 183.0 lb

## 2022-05-31 DIAGNOSIS — Z79899 Other long term (current) drug therapy: Secondary | ICD-10-CM

## 2022-05-31 DIAGNOSIS — M5136 Other intervertebral disc degeneration, lumbar region: Secondary | ICD-10-CM

## 2022-05-31 DIAGNOSIS — E559 Vitamin D deficiency, unspecified: Secondary | ICD-10-CM

## 2022-05-31 DIAGNOSIS — I10 Essential (primary) hypertension: Secondary | ICD-10-CM

## 2022-05-31 DIAGNOSIS — Z833 Family history of diabetes mellitus: Secondary | ICD-10-CM | POA: Diagnosis not present

## 2022-05-31 DIAGNOSIS — Z882 Allergy status to sulfonamides status: Secondary | ICD-10-CM

## 2022-05-31 DIAGNOSIS — N319 Neuromuscular dysfunction of bladder, unspecified: Secondary | ICD-10-CM

## 2022-05-31 DIAGNOSIS — E785 Hyperlipidemia, unspecified: Secondary | ICD-10-CM

## 2022-05-31 DIAGNOSIS — Z83511 Family history of glaucoma: Secondary | ICD-10-CM

## 2022-05-31 DIAGNOSIS — Z800 Family history of malignant neoplasm of digestive organs: Secondary | ICD-10-CM | POA: Diagnosis not present

## 2022-05-31 DIAGNOSIS — Z808 Family history of malignant neoplasm of other organs or systems: Secondary | ICD-10-CM | POA: Diagnosis not present

## 2022-05-31 DIAGNOSIS — Z836 Family history of other diseases of the respiratory system: Secondary | ICD-10-CM | POA: Diagnosis not present

## 2022-05-31 DIAGNOSIS — Z886 Allergy status to analgesic agent status: Secondary | ICD-10-CM | POA: Diagnosis not present

## 2022-05-31 DIAGNOSIS — Z8249 Family history of ischemic heart disease and other diseases of the circulatory system: Secondary | ICD-10-CM

## 2022-05-31 DIAGNOSIS — Z8349 Family history of other endocrine, nutritional and metabolic diseases: Secondary | ICD-10-CM | POA: Diagnosis not present

## 2022-05-31 DIAGNOSIS — Z86010 Personal history of colonic polyps: Secondary | ICD-10-CM | POA: Diagnosis not present

## 2022-05-31 DIAGNOSIS — D696 Thrombocytopenia, unspecified: Secondary | ICD-10-CM

## 2022-05-31 DIAGNOSIS — Z832 Family history of diseases of the blood and blood-forming organs and certain disorders involving the immune mechanism: Secondary | ICD-10-CM

## 2022-05-31 DIAGNOSIS — Z8269 Family history of other diseases of the musculoskeletal system and connective tissue: Secondary | ICD-10-CM

## 2022-05-31 DIAGNOSIS — Z888 Allergy status to other drugs, medicaments and biological substances status: Secondary | ICD-10-CM | POA: Diagnosis not present
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
## 2022-06-02 DIAGNOSIS — N312 Flaccid neuropathic bladder, not elsewhere classified: Secondary | ICD-10-CM | POA: Diagnosis not present

## 2022-06-02 DIAGNOSIS — N401 Enlarged prostate with lower urinary tract symptoms: Secondary | ICD-10-CM | POA: Diagnosis not present

## 2022-06-02 DIAGNOSIS — N3281 Overactive bladder: Secondary | ICD-10-CM | POA: Diagnosis not present

## 2022-06-02 DIAGNOSIS — R350 Frequency of micturition: Secondary | ICD-10-CM | POA: Diagnosis not present

## 2022-06-03 DIAGNOSIS — M5416 Radiculopathy, lumbar region: Secondary | ICD-10-CM | POA: Diagnosis not present

## 2022-06-13 DIAGNOSIS — H401112 Primary open-angle glaucoma, right eye, moderate stage: Secondary | ICD-10-CM | POA: Diagnosis not present

## 2022-06-13 DIAGNOSIS — H401123 Primary open-angle glaucoma, left eye, severe stage: Secondary | ICD-10-CM | POA: Diagnosis not present

## 2022-06-27 ENCOUNTER — Ambulatory Visit (INDEPENDENT_AMBULATORY_CARE_PROVIDER_SITE_OTHER): Payer: Medicare PPO | Admitting: Neurology

## 2022-06-27 ENCOUNTER — Encounter: Payer: Self-pay | Admitting: Neurology

## 2022-06-27 VITALS — BP 123/52 | HR 51 | Ht 70.0 in | Wt 183.6 lb

## 2022-06-27 DIAGNOSIS — R0683 Snoring: Secondary | ICD-10-CM

## 2022-06-27 DIAGNOSIS — R001 Bradycardia, unspecified: Secondary | ICD-10-CM

## 2022-06-27 DIAGNOSIS — R0681 Apnea, not elsewhere classified: Secondary | ICD-10-CM | POA: Diagnosis not present

## 2022-06-27 DIAGNOSIS — I495 Sick sinus syndrome: Secondary | ICD-10-CM

## 2022-06-27 DIAGNOSIS — G4730 Sleep apnea, unspecified: Secondary | ICD-10-CM

## 2022-06-27 DIAGNOSIS — R351 Nocturia: Secondary | ICD-10-CM

## 2022-06-27 DIAGNOSIS — I493 Ventricular premature depolarization: Secondary | ICD-10-CM | POA: Diagnosis not present

## 2022-06-27 DIAGNOSIS — E663 Overweight: Secondary | ICD-10-CM
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
## 2022-06-27 NOTE — Patient Instructions (Signed)

## 2022-06-30 DIAGNOSIS — M5416 Radiculopathy, lumbar region: Secondary | ICD-10-CM | POA: Diagnosis not present

## 2022-06-30 DIAGNOSIS — Z6826 Body mass index (BMI) 26.0-26.9, adult: Secondary | ICD-10-CM | POA: Diagnosis not present

## 2022-07-07 ENCOUNTER — Telehealth: Payer: Self-pay | Admitting: Neurology

## 2022-07-07 NOTE — Telephone Encounter (Signed)
[employer] pending uploaded notes on the portal  

## 2022-07-12 ENCOUNTER — Telehealth: Payer: Self-pay

## 2022-07-12 NOTE — Telephone Encounter (Signed)
   Primary Cardiologist: None  Chart reviewed as part of pre-operative protocol coverage. Given past medical history and time since last visit, based on ACC/AHA guidelines, Mamoudou Mulvehill would be at acceptable risk for the planned procedure without further cardiovascular testing.   Patient was advised that if he develops new symptoms prior to surgery to contact our office to arrange a follow-up appointment.  He verbalized understanding.  He stopped taking aspirin on a consistent basis following a conversation with Dr. Lovena Le about bleeding risks.   I will route this recommendation to the requesting party via Epic fax function and remove from pre-op pool.  Please call with questions.  Emmaline Life, NP-C    07/12/2022, 1:24 PM [place] [address] Office [phone] Fax [phone]

## 2022-07-12 NOTE — Telephone Encounter (Signed)
checked status on the portal it is still pending

## 2022-07-12 NOTE — Telephone Encounter (Signed)
   Pre-operative Risk Assessment    Patient Name: Paul Parks  DOB: 1936-08-27 MRN: [id number]      Request for Surgical Clearance    Procedure:   L4-5 LUMBAR LAMINECTOMY ARTHRODESIS WITH INTERSPINOUS PLATING   Date of Surgery:  Clearance 07/20/22                                 Surgeon:  Marlynn Perking PROVIDED Surgeon's Group or Practice Name:  [facility] Phone number:  [phone] Fax number:  [phone]   Type of Clearance Requested:   - Medical  - Pharmacy:  Hold Aspirin     Type of Anesthesia:  General    Additional requests/questions:    SignedJacinta Shoe   07/12/2022, 10:27 AM

## 2022-07-14 ENCOUNTER — Other Ambulatory Visit: Payer: Medicare PPO

## 2022-07-14 DIAGNOSIS — M4316 Spondylolisthesis, lumbar region: Secondary | ICD-10-CM | POA: Diagnosis not present

## 2022-07-14 DIAGNOSIS — Z01818 Encounter for other preprocedural examination: Secondary | ICD-10-CM | POA: Diagnosis not present

## 2022-07-14 NOTE — Telephone Encounter (Signed)
Rec'd auth for [employer]; lvm for pt to call us back to schedule in lab sleep study

## 2022-07-15 DIAGNOSIS — M4316 Spondylolisthesis, lumbar region: Secondary | ICD-10-CM | POA: Diagnosis not present

## 2022-07-20 DIAGNOSIS — M4316 Spondylolisthesis, lumbar region: Secondary | ICD-10-CM | POA: Diagnosis not present

## 2022-07-20 DIAGNOSIS — M5416 Radiculopathy, lumbar region: Secondary | ICD-10-CM | POA: Diagnosis not present

## 2022-07-20 DIAGNOSIS — M48061 Spinal stenosis, lumbar region without neurogenic claudication: Secondary | ICD-10-CM | POA: Diagnosis not present

## 2022-07-20 NOTE — Telephone Encounter (Signed)
LVM for pt to call back to schedule sleep study.  

## 2022-07-26 NOTE — Telephone Encounter (Signed)
Paul Parks Paul Parks: [id number] (exp. 07/07/22 to 08/06/22) get an extension on auth once scheduled.  Spoke with the patient he stated he just had back surgery and is not ready to do the sleep study yet. I informed me to give me a call when he is readyt

## 2022-08-16 ENCOUNTER — Encounter: Payer: Self-pay | Admitting: Internal Medicine

## 2022-08-16 ENCOUNTER — Ambulatory Visit: Payer: Medicare PPO | Attending: Internal Medicine | Admitting: Internal Medicine

## 2022-08-16 VITALS — BP 138/64 | HR 49 | Ht 70.5 in | Wt 183.0 lb

## 2022-08-16 DIAGNOSIS — I495 Sick sinus syndrome: Secondary | ICD-10-CM

## 2022-08-16 DIAGNOSIS — I10 Essential (primary) hypertension: Secondary | ICD-10-CM | POA: Diagnosis not present

## 2022-08-16 DIAGNOSIS — I493 Ventricular premature depolarization: Secondary | ICD-10-CM

## 2022-08-16 NOTE — Patient Instructions (Signed)
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
## 2022-08-22 DIAGNOSIS — N3281 Overactive bladder: Secondary | ICD-10-CM | POA: Diagnosis not present

## 2022-08-22 DIAGNOSIS — N401 Enlarged prostate with lower urinary tract symptoms: Secondary | ICD-10-CM | POA: Diagnosis not present

## 2022-08-22 DIAGNOSIS — R350 Frequency of micturition: Secondary | ICD-10-CM | POA: Diagnosis not present

## 2022-08-22 DIAGNOSIS — N312 Flaccid neuropathic bladder, not elsewhere classified: Secondary | ICD-10-CM | POA: Diagnosis not present

## 2022-09-01 DIAGNOSIS — M4316 Spondylolisthesis, lumbar region: Secondary | ICD-10-CM | POA: Diagnosis not present

## 2022-09-01 DIAGNOSIS — Z6826 Body mass index (BMI) 26.0-26.9, adult: Secondary | ICD-10-CM | POA: Diagnosis not present

## 2022-09-19 ENCOUNTER — Other Ambulatory Visit: Payer: Self-pay

## 2022-09-19 ENCOUNTER — Ambulatory Visit: Payer: Medicare PPO | Attending: Neurological Surgery | Admitting: Physical Therapy

## 2022-09-19 DIAGNOSIS — M545 Low back pain, unspecified: Secondary | ICD-10-CM | POA: Diagnosis not present

## 2022-09-19 DIAGNOSIS — R293 Abnormal posture: Secondary | ICD-10-CM | POA: Insufficient documentation

## 2022-09-19 DIAGNOSIS — M5459 Other low back pain: Secondary | ICD-10-CM | POA: Insufficient documentation
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
## 2022-09-20 ENCOUNTER — Other Ambulatory Visit: Payer: Self-pay | Admitting: Family Medicine

## 2022-09-20 ENCOUNTER — Encounter: Payer: Medicare PPO | Admitting: Physical Therapy

## 2022-09-21 ENCOUNTER — Encounter: Payer: Self-pay | Admitting: Physical Therapy

## 2022-09-21 ENCOUNTER — Ambulatory Visit: Payer: Medicare PPO | Admitting: Physical Therapy

## 2022-09-21 DIAGNOSIS — M545 Low back pain, unspecified: Secondary | ICD-10-CM

## 2022-09-21 DIAGNOSIS — Z961 Presence of intraocular lens: Secondary | ICD-10-CM | POA: Diagnosis not present

## 2022-09-21 DIAGNOSIS — H401123 Primary open-angle glaucoma, left eye, severe stage: Secondary | ICD-10-CM | POA: Diagnosis not present

## 2022-09-21 DIAGNOSIS — H401112 Primary open-angle glaucoma, right eye, moderate stage: Secondary | ICD-10-CM | POA: Diagnosis not present

## 2022-09-21 DIAGNOSIS — R293 Abnormal posture: Secondary | ICD-10-CM

## 2022-09-21 DIAGNOSIS — M5459 Other low back pain: Secondary | ICD-10-CM

## 2022-09-21 DIAGNOSIS — H53462 Homonymous bilateral field defects, left side: Secondary | ICD-10-CM | POA: Diagnosis not present
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
## 2022-09-29 ENCOUNTER — Ambulatory Visit: Payer: Medicare PPO

## 2022-09-29 DIAGNOSIS — M5459 Other low back pain: Secondary | ICD-10-CM

## 2022-09-29 DIAGNOSIS — R293 Abnormal posture: Secondary | ICD-10-CM

## 2022-09-29 DIAGNOSIS — M545 Low back pain, unspecified: Secondary | ICD-10-CM | POA: Diagnosis not present
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
## 2022-09-30 ENCOUNTER — Encounter: Payer: Medicare PPO | Admitting: Physical Therapy

## 2022-09-30 ENCOUNTER — Ambulatory Visit: Payer: Medicare PPO | Admitting: Family Medicine

## 2022-09-30 ENCOUNTER — Encounter (HOSPITAL_COMMUNITY): Payer: Self-pay | Admitting: Emergency Medicine

## 2022-09-30 ENCOUNTER — Emergency Department (HOSPITAL_COMMUNITY)
Admission: EM | Admit: 2022-09-30 | Discharge: 2022-09-30 | Disposition: A | Discharge status: Home / Self Care | Payer: Medicare PPO | Attending: Emergency Medicine | Admitting: Emergency Medicine

## 2022-09-30 ENCOUNTER — Other Ambulatory Visit: Payer: Self-pay

## 2022-09-30 ENCOUNTER — Emergency Department (HOSPITAL_COMMUNITY): Payer: Medicare PPO

## 2022-09-30 DIAGNOSIS — I700 Atherosclerosis of aorta: Secondary | ICD-10-CM | POA: Diagnosis not present

## 2022-09-30 DIAGNOSIS — R1030 Lower abdominal pain, unspecified: Secondary | ICD-10-CM

## 2022-09-30 DIAGNOSIS — R1032 Left lower quadrant pain: Secondary | ICD-10-CM | POA: Diagnosis not present

## 2022-09-30 DIAGNOSIS — R109 Unspecified abdominal pain: Secondary | ICD-10-CM | POA: Diagnosis not present

## 2022-09-30 LAB — URINALYSIS, ROUTINE W REFLEX MICROSCOPIC
Bilirubin Urine: NEGATIVE
Glucose, UA: NEGATIVE mg/dL
Hgb urine dipstick: NEGATIVE
Ketones, ur: NEGATIVE mg/dL
Leukocytes,Ua: NEGATIVE
Nitrite: NEGATIVE
Protein, ur: NEGATIVE mg/dL
Specific Gravity, Urine: 1.009 (ref 1.005–1.030)
pH: 5 (ref 5.0–8.0)

## 2022-09-30 LAB — POTASSIUM: Potassium: 4.3 mmol/L (ref 3.5–5.1)

## 2022-09-30 LAB — COMPREHENSIVE METABOLIC PANEL
ALT: 11 U/L (ref 0–44)
AST: 40 U/L (ref 15–41)
Albumin: 3.7 g/dL (ref 3.5–5.0)
Alkaline Phosphatase: 40 U/L (ref 38–126)
Anion gap: 7 (ref 5–15)
BUN: 17 mg/dL (ref 8–23)
CO2: 23 mmol/L (ref 22–32)
Calcium: 8.6 mg/dL — ABNORMAL LOW (ref 8.9–10.3)
Chloride: 105 mmol/L (ref 98–111)
Creatinine, Ser: 1.17 mg/dL (ref 0.61–1.24)
GFR, Estimated: >60 mL/min (ref >60)
Glucose, Bld: 87 mg/dL (ref 70–99)
Potassium: 5.5 mmol/L — ABNORMAL HIGH (ref 3.5–5.1)
Sodium: 135 mmol/L (ref 135–145)
Total Bilirubin: 1.1 mg/dL (ref 0.3–1.2)
Total Protein: 6.1 g/dL — ABNORMAL LOW (ref 6.5–8.1)

## 2022-09-30 LAB — LIPASE, BLOOD: Lipase: 30 U/L (ref 11–51)

## 2022-09-30 LAB — CBC
HCT: 35.6 % — ABNORMAL LOW (ref 39.0–52.0)
Hemoglobin: 12.3 g/dL — ABNORMAL LOW (ref 13.0–17.0)
MCH: 31.9 pg (ref 26.0–34.0)
MCHC: 34.6 g/dL (ref 30.0–36.0)
MCV: 92.2 fL (ref 80.0–100.0)
Platelets: 137 10*3/uL — ABNORMAL LOW (ref 150–400)
RBC: 3.86 MIL/uL — ABNORMAL LOW (ref 4.22–5.81)
RDW: 12.6 % (ref 11.5–15.5)
WBC: 5.5 10*3/uL (ref 4.0–10.5)
nRBC: 0 % (ref 0.0–0.2)

## 2022-09-30 MED ORDER — IOHEXOL 300 MG/ML  SOLN
100.0000 mL | Freq: Once | INTRAMUSCULAR | Status: AC | PRN
  Administered 2022-09-30: 100 mL via INTRAVENOUS

## 2022-09-30 NOTE — ED Provider Notes (Signed)
[facility] EMERGENCY DEPARTMENT Provider Note   CSN: [id number] Arrival date & time: 09/30/22  1253     History {Add pertinent medical, surgical, social history, OB history to HPI:1} Chief Complaint  Patient presents with   Abdominal Pain    Paul Parks is a 86 y.o. male.  Patient has a history of hyperlipidemia.  He presents with left lower quadrant abdominal pain that has been been persistent for a number days but has gotten worse.  No nausea no vomiting  The history is provided by the patient and medical records. No language interpreter was used.  Abdominal Pain Pain location:  LLQ Pain quality: aching   Pain radiates to:  Does not radiate Pain severity:  Moderate Onset quality:  Sudden Timing:  Constant Progression:  Waxing and waning Chronicity:  New Context: not alcohol use   Associated symptoms: no chest pain, no cough, no diarrhea, no fatigue and no hematuria        Home Medications Prior to Admission medications   Medication Sig Start Date End Date Taking? Authorizing Provider  alfuzosin (UROXATRAL) 10 MG 24 hr tablet Take 10 mg by mouth daily.  01/08/20   [provider]  amLODipine (NORVASC) 5 MG tablet Take 1 tablet (5 mg total) by mouth daily. 11/12/21   Evans Lance, MD  cholecalciferol (VITAMIN D) 1000 UNITS tablet Take 1,000 Units by mouth daily.    [provider]  doxycycline (VIBRA-TABS) 100 MG tablet Take 2 tablets as needed for tick bite as directed 05/11/22   Ronnie Doss M, DO  fluticasone (FLONASE) 50 MCG/ACT nasal spray Place 1 spray into both nostrils daily as needed for allergies.    [provider]  meclizine (ANTIVERT) 12.5 MG tablet Take 12.5 mg by mouth 4 (four) times daily as needed. Take one (1) tablet (12.5 mg) by mouth four (4) times daily with meals and at bedtime for dizziness as needed    [provider]  meloxicam (MOBIC) 15 MG tablet TAKE 1/2 TO 1 TABLET (7.5-15 MG TOTAL) BY MOUTH DAILY AS  NEEDED FOR PAIN 09/20/22   [name], DO  Multiple Vitamin (MULTIVITAMIN WITH MINERALS) TABS tablet Take 1 tablet by mouth daily.    [provider]  rosuvastatin (CRESTOR) 10 MG tablet TAKE 1 TABLET BY MOUTH 2 TIMES A WEEK Patient taking differently: Take 10 mg by mouth 2 (two) times a week. TAKE 1 TABLET BY MOUTH 2 TIMES A WEEK 05/14/21   Ronnie Doss M, DO  tadalafil (CIALIS) 10 MG tablet Take 1 tablet (10 mg total) by mouth daily as needed for erectile dysfunction. Patient taking differently: Take 10 mg by mouth as needed for erectile dysfunction. 05/13/20   [name], DO  timolol (BETIMOL) 0.5 % ophthalmic solution Place 1 drop into both eyes daily.    [provider]      Allergies    Sulfa antibiotics, Ibuprofen, Gabapentin, and Vesicare [solifenacin]    Review of Systems   Review of Systems  Constitutional:  Negative for appetite change and fatigue.  HENT:  Negative for congestion, ear discharge and sinus pressure.   Eyes:  Negative for discharge.  Respiratory:  Negative for cough.   Cardiovascular:  Negative for chest pain.  Gastrointestinal:  Positive for abdominal pain. Negative for diarrhea.  Genitourinary:  Negative for frequency and hematuria.  Musculoskeletal:  Negative for back pain.  Skin:  Negative for rash.  Neurological:  Negative for seizures and headaches.  Psychiatric/Behavioral:  Negative for hallucinations.     Physical Exam Updated Vital Signs BP (!) 129/55   Pulse 71   Temp 98.1 F (36.7 C) (Oral)   Resp 16   SpO2 98%  Physical Exam Vitals and nursing note reviewed.  Constitutional:      Appearance: He is well-developed.  HENT:     Head: Normocephalic.     Nose: Nose normal.  Eyes:     General: No scleral icterus.    Conjunctiva/sclera: Conjunctivae normal.  Neck:     Thyroid: No thyromegaly.  Cardiovascular:     Rate and Rhythm: Normal rate and regular rhythm.     Heart sounds: No murmur heard.    No  friction rub. No gallop.  Pulmonary:     Breath sounds: No stridor. No wheezing or rales.  Chest:     Chest wall: No tenderness.  Abdominal:     General: There is no distension.     Tenderness: There is abdominal tenderness. There is no rebound.  Musculoskeletal:        General: Normal range of motion.     Cervical back: Neck supple.  Lymphadenopathy:     Cervical: No cervical adenopathy.  Skin:    Findings: No erythema or rash.  Neurological:     Mental Status: He is alert and oriented to person, place, and time.     Motor: No abnormal muscle tone.     Coordination: Coordination normal.  Psychiatric:        Behavior: Behavior normal.     ED Results / Procedures / Treatments   Labs (all labs ordered are listed, but only abnormal results are displayed) Labs Reviewed  COMPREHENSIVE METABOLIC PANEL - Abnormal; Notable for the following components:      Result Value   Potassium 5.5 (*)    Calcium 8.6 (*)    Total Protein 6.1 (*)    All other components within normal limits  CBC - Abnormal; Notable for the following components:   RBC 3.86 (*)    Hemoglobin 12.3 (*)    HCT 35.6 (*)    Platelets 137 (*)    All other components within normal limits  LIPASE, BLOOD  URINALYSIS, ROUTINE W REFLEX MICROSCOPIC  POTASSIUM    EKG None  Radiology No results found.  Procedures Procedures  {Document cardiac monitor, telemetry assessment procedure when appropriate:1}  Medications Ordered in ED Medications  iohexol (OMNIPAQUE) 300 MG/ML solution 100 mL (100 mLs Intravenous Contrast Given 09/30/22 1456)    ED Course/ Medical Decision Making/ A&P                           Medical Decision Making Amount and/or Complexity of Data Reviewed Labs: ordered. Radiology: ordered.  Risk Prescription drug management.   Patient with left lower quadrant hernia containing some fat.  He will be referred to general surgery  {Document critical care time when  appropriate:1} {Document review of labs and clinical decision tools ie heart score, Chads2Vasc2 etc:1}  {Document your independent review of radiology images, and any outside records:1} {Document your discussion with family members, caretakers, and with consultants:1} {Document social determinants of health affecting pt's care:1} {Document your decision making why or why not admission, treatments were needed:1} Final Clinical Impression(s) / ED Diagnoses Final diagnoses:  None    Rx / DC Orders ED Discharge Orders     None

## 2022-09-30 NOTE — ED Triage Notes (Signed)
LLQ pain with reports of the area "getting larger"  Worse with physical work.  Has been worsening the last week and a half and worst today.  No n/v/d.  Pain does not radiate.

## 2022-09-30 NOTE — Discharge Instructions (Signed)
Follow-up with Dr. Okey Dupre or one of her partners in the next few weeks to look at your hernia.  Just take Tylenol for discomfort if necessary

## 2022-09-30 NOTE — ED Provider Notes (Signed)
  Physical Exam  BP (!) 129/55   Pulse 71   Temp 98.1 F (36.7 C) (Oral)   Resp 16   SpO2 98%   Physical Exam  Procedures  Procedures  ED Course / MDM    Medical Decision Making Amount and/or Complexity of Data Reviewed Labs: ordered. Radiology: ordered.  Risk Prescription drug management.   Received patient in signout.  Recheck potassium.  Potassium that was elevated is likely an error.  It is normal now.  Discharge home.       Davonna Belling, MD 09/30/22 [phone]

## 2022-10-03 ENCOUNTER — Ambulatory Visit: Payer: Medicare PPO | Admitting: Family Medicine

## 2022-10-03 ENCOUNTER — Ambulatory Visit: Payer: Medicare PPO | Admitting: Physical Therapy

## 2022-10-03 DIAGNOSIS — H401112 Primary open-angle glaucoma, right eye, moderate stage: Secondary | ICD-10-CM | POA: Diagnosis not present

## 2022-10-03 DIAGNOSIS — H53462 Homonymous bilateral field defects, left side: Secondary | ICD-10-CM | POA: Diagnosis not present

## 2022-10-03 DIAGNOSIS — H401123 Primary open-angle glaucoma, left eye, severe stage: Secondary | ICD-10-CM | POA: Diagnosis not present

## 2022-10-05 ENCOUNTER — Ambulatory Visit: Payer: Medicare PPO | Admitting: Physical Therapy

## 2022-10-07 DIAGNOSIS — G9389 Other specified disorders of brain: Secondary | ICD-10-CM | POA: Diagnosis not present

## 2022-10-07 DIAGNOSIS — H53469 Homonymous bilateral field defects, unspecified side: Secondary | ICD-10-CM | POA: Diagnosis not present

## 2022-10-10 ENCOUNTER — Encounter: Payer: Medicare PPO | Admitting: Physical Therapy

## 2022-10-13 ENCOUNTER — Encounter: Payer: Medicare PPO | Admitting: Physical Therapy

## 2022-10-18 ENCOUNTER — Encounter: Payer: Self-pay | Admitting: General Surgery

## 2022-10-18 ENCOUNTER — Ambulatory Visit: Payer: Medicare PPO | Admitting: General Surgery

## 2022-10-18 VITALS — BP 142/62 | HR 54 | Temp 97.8°F | Resp 14 | Ht 70.5 in | Wt 183.0 lb

## 2022-10-18 DIAGNOSIS — K439 Ventral hernia without obstruction or gangrene: Secondary | ICD-10-CM | POA: Insufficient documentation

## 2022-10-18 NOTE — Patient Instructions (Signed)
Laparoscopic/ Robotic Assisted Ventral Hernia Repair Laparoscopic/ robotic assisted ventral hernia repairis a procedure to fix a bulge of tissue that pushes through a weak area of muscle in the abdomen (ventral hernia). A ventral hernia may be: Above the belly button. This is called an epigastric hernia. At the belly button. This is called an umbilical hernia. At the incision site from previous abdominal surgery. This is called an incisional hernia. You may have this procedure as emergency surgery if part of your intestine gets trapped inside the hernia and starts to lose its blood supply (strangulation). Laparoscopic surgery is done through small incisions using a thin surgical telescope with a light and camera (laparoscope). During surgery, your surgeon will use images from the laparoscope to guide the procedure. A mesh screen will be placed in the hernia to close the opening and strengthen the abdominal wall. Tell a health care provider about: Any allergies you have. All medicines you are taking, including vitamins, herbs, eye drops, creams, and over-the-counter medicines. Any problems you or family members have had with anesthetic medicines. Any blood disorders you have. Any surgeries you have had. Any medical conditions you have. Whether you are pregnant or may be pregnant. What are the risks? Generally, this is a safe procedure. However, problems may occur, including: Infection. Bleeding. Damage to nearby structures or organs in the abdomen. Trouble urinating or having a bowel movement after surgery. Blood clots. The hernia coming back after surgery. Fluid buildup in the area of the hernia. In some cases, your health care provider may need to switch from a laparoscopic procedure to a procedure that is done through a single, larger incision in the abdomen (open procedure). You may need an open procedure if: You have a hernia that is difficult to repair. Your organs are hard to see with  the laparoscope. You have bleeding problems during the laparoscopic procedure. What happens before the procedure? Medicines Ask your health care provider about: Changing or stopping your regular medicines. This is especially important if you are taking diabetes medicines or blood thinners. Taking medicines such as aspirin and ibuprofen. These medicines can thin your blood. Do not take these medicines unless your health care provider tells you to take them. Taking over-the-counter medicines, vitamins, herbs, and supplements. Tests You may need to have tests before the procedure, such as: Blood tests. Urine tests. Abdominal ultrasound. Chest X-ray. Electrocardiogram (ECG). General instructions You may be asked to take a laxative or do an enema to empty your bowel before surgery (bowel prep). Do not use any products that contain nicotine or tobacco for at least 4 weeks before the procedure. These products include cigarettes, chewing tobacco, and vaping devices, such as e-cigarettes. If you need help quitting, ask your health care provider. Ask your health care provider: How your surgery site will be marked. What steps will be taken to help prevent infection. These steps may include: Removing hair at the surgery site. Washing skin with a germ-killing soap. Receiving antibiotic medicine. Plan to have a responsible adult take you home from the hospital or clinic. If you will be going home right after the procedure, plan to have a responsible adult care for you for the time you are told. This is important. What happens during the procedure?  An IV will be inserted into one of your veins. You will be given one or more of the following: A medicine to help you relax (sedative). A medicine to numb the area (local anesthetic). A medicine to make you  fall asleep (general anesthetic). A small incision will be made in your abdomen. A hollow metal tube (trocar) will be placed through the  incision. A tube will be placed through the trocar to inflate your abdomen with carbon dioxide. This makes it easier for your surgeon to see inside your abdomen during the repair. A laparoscope will be inserted into your abdomen through the trocar. The laparoscope will send images to a monitor in the operating room. Other trocars will be put through other small incisions in your abdomen. The surgical instruments needed for the procedure will be placed through these trocars. The tissue or intestines that make up the hernia will be moved back into place. The edges of the hernia may be stitched (sutured) together. A piece of mesh will be used to close the hernia. Sutures, clips, or staples will be used to keep the mesh in place. A bandage (dressing) or skin glue will be put over the incisions. The procedure may vary among health care providers and hospitals. What happens after the procedure? Your blood pressure, heart rate, breathing rate, and blood oxygen level will be monitored until you leave the hospital or clinic. You will continue to receive fluids and medicines through an IV. Your IV will be removed when you can drink clear fluids. You will be given pain medicine as needed. You will be encouraged to get up and walk around as soon as possible. You will be shown how to do deep breathing exercises to help prevent a lung infection. If you were given a sedative during the procedure, it can affect you for several hours. Do not drive or operate machinery until your health care provider says that it is safe. Summary Laparoscopic ventral hernia is an operation to fix a hernia using small incisions. Tell your health care provider about other medical conditions that you have and about all the medicines that you are taking. Follow instructions from your health care provider about eating and drinking before the procedure. Plan to have a responsible adult take you home from the hospital or clinic. After  the procedure, you will be encouraged to walk as soon as possible. You will also be taught how to do deep breathing exercises. This information is not intended to replace advice given to you by your health care provider. Make sure you discuss any questions you have with your health care provider. Document Revised: 07/10/2020 Document Reviewed: 07/10/2020 Elsevier Patient Education  [place].
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
## 2022-11-02 NOTE — Patient Instructions (Signed)
Paul Parks  11/02/2022     '@PREFPERIOPPHARMACY'$ @   Your procedure is scheduled on  11/07/2022.   Report to [name] at  [phone]  A.M.   Call this number if you have problems the morning of surgery:  [phone]  If you experience any cold or flu symptoms such as cough, fever, chills, shortness of breath, etc. between now and your scheduled surgery, please notify us at the above number.   Remember:  Do not eat or drink after midnight.      Take these medicines the morning of surgery with A SIP OF WATER        amlodipine, antivert(if needed), mobic(if needed).     Do not wear jewelry, make-up or nail polish.  Do not wear lotions, powders, or perfumes, or deodorant.  Do not shave 48 hours prior to surgery.  Men may shave face and neck.  Do not bring valuables to the hospital.  [facility] is not responsible for any belongings or valuables.  Contacts, dentures or bridgework may not be worn into surgery.  Leave your suitcase in the car.  After surgery it may be brought to your room.  For patients admitted to the hospital, discharge time will be determined by your treatment team.  Patients discharged the day of surgery will not be allowed to drive home and must have someone with them for 24 hours.    Special instructions:   DO NOT smoke tobacco or vape for 24 hours before your procedure.  Please read over the following fact sheets that you were given. Coughing and Deep Breathing, Surgical Site Infection Prevention, Anesthesia Post-op Instructions, and Care and Recovery After Surgery      Laparoscopic Ventral Hernia Repair, Care After The following information offers guidance on how to care for yourself after your procedure. Your health care provider may also give you more specific instructions. If you have problems or questions, contact your health care provider. What can I expect after the procedure? After the procedure, it is common to have pain, discomfort,  or soreness. Follow these instructions at home: Medicines Take over-the-counter and prescription medicines only as told by your health care provider. Ask your health care provider if the medicine prescribed to you: Requires you to avoid driving or using machinery. Can cause constipation. You may need to take these actions to prevent or treat constipation: Drink enough fluid to keep your urine pale yellow. Take over-the-counter or prescription medicines. Eat foods that are high in fiber, such as beans, whole grains, and fresh fruits and vegetables. Limit foods that are high in fat and processed sugars, such as fried or sweet foods. Incision care  Follow instructions from your health care provider about how to take care of your incisions. Make sure you: Wash your hands with soap and water for at least 20 seconds before and after you change your bandage (dressing) or before you touch your abdomen. If soap and water are not available, use hand sanitizer. Change your dressing as told by your health care provider. Leave stitches (sutures), skin glue, or adhesive strips in place. These skin closures may need to stay in place for 2 weeks or longer. If adhesive strip edges start to loosen and curl up, you may trim the loose edges. Do not remove adhesive strips completely unless your health care provider tells you to do that. Check your incision areas every day for signs of infection. Check for: More redness, swelling, or pain.  Fluid or blood. Warmth. Pus or a bad smell. Bathing  Do not take baths, swim, or use a hot tub until your health care provider approves. Ask your health care provider if you may take showers. You may only be allowed to take sponge baths. Keep your dressing dry until your health care provider says it can be removed. Activity  Rest as told by your health care provider. Avoid sitting for a long time without moving. Get up to take short walks every 1-2 hours. This is important  to improve blood flow and breathing. Ask for help if you feel weak or unsteady. Do not lift anything that is heavier than 10 lb (4.5 kg), or the limit that you are told, until your health care provider says that it is safe. If you were given a sedative during the procedure, it can affect you for several hours. Do not drive or operate machinery until your health care provider says that it is safe. Return to your normal activities as told by your health care provider. Ask your health care provider what activities are safe for you. General instructions  Hold a pillow over your abdomen when you cough or sneeze. This helps with pain. Do not use any products that contain nicotine or tobacco. These products include cigarettes, chewing tobacco, and vaping devices, such as e-cigarettes. These can delay healing after surgery. If you need help quitting, ask your health care provider. You may be asked to continue to do deep breathing exercises at home. This will help to prevent a lung infection. Keep all follow-up visits. This is important. Contact a health care provider if: You have any of these signs of infection: More redness, swelling, or pain around an incision. Fluid or blood coming from an incision. Warmth coming from an incision. Pus or a bad smell coming from an incision. A fever or chills. You have pain that gets worse or does not get better with medicine. You have nausea or vomiting. You have a cough. You have shortness of breath. You have not had a bowel movement in 3 days. You are not able to urinate. Get help right away if you have: Severe pain in your abdomen. Persistent nausea and vomiting. Redness, warmth, or pain in your leg. Chest pain. Trouble breathing. These symptoms may represent a serious problem that is an emergency. Do not wait to see if the symptoms will go away. Get medical help right away. Call your local emergency services (911 in the [place] Do not drive yourself to the  hospital. Summary After this procedure, it is common to have pain, discomfort, or soreness. Follow instructions from your health care provider about how to take care of your incision. Check your incision area every day for signs of infection. Report any signs of infection to your health care provider. Keep all follow-up visits. This is important. This information is not intended to replace advice given to you by your health care provider. Make sure you discuss any questions you have with your health care provider. Document Revised: 07/10/2020 Document Reviewed: 07/10/2020 Elsevier Patient Education  [place]. How to Use Chlorhexidine Before Surgery Chlorhexidine gluconate (CHG) is a germ-killing (antiseptic) solution that is used to clean the skin. It can get rid of the bacteria that normally live on the skin and can keep them away for about 24 hours. To clean your skin with CHG, you may be given: A CHG solution to use in the shower or as part of a sponge bath.  A prepackaged cloth that contains CHG. Cleaning your skin with CHG may help lower the risk for infection: While you are staying in the intensive care unit of the hospital. If you have a vascular access, such as a central line, to provide short-term or long-term access to your veins. If you have a catheter to drain urine from your bladder. If you are on a ventilator. A ventilator is a machine that helps you breathe by moving air in and out of your lungs. After surgery. What are the risks? Risks of using CHG include: A skin reaction. Hearing loss, if CHG gets in your ears and you have a perforated eardrum. Eye injury, if CHG gets in your eyes and is not rinsed out. The CHG product catching fire. Make sure that you avoid smoking and flames after applying CHG to your skin. Do not use CHG: If you have a chlorhexidine allergy or have previously reacted to chlorhexidine. On babies younger than 50 months of age. How to use CHG  solution Use CHG only as told by your health care provider, and follow the instructions on the label. Use the full amount of CHG as directed. Usually, this is one bottle. During a shower Follow these steps when using CHG solution during a shower (unless your health care provider gives you different instructions): Start the shower. Use your normal soap and shampoo to wash your face and hair. Turn off the shower or move out of the shower stream. Pour the CHG onto a clean washcloth. Do not use any type of brush or rough-edged sponge. Starting at your neck, lather your body down to your toes. Make sure you follow these instructions: If you will be having surgery, pay special attention to the part of your body where you will be having surgery. Scrub this area for at least 1 minute. Do not use CHG on your head or face. If the solution gets into your ears or eyes, rinse them well with water. Avoid your genital area. Avoid any areas of skin that have broken skin, cuts, or scrapes. Scrub your back and under your arms. Make sure to wash skin folds. Let the lather sit on your skin for 1-2 minutes or as long as told by your health care provider. Thoroughly rinse your entire body in the shower. Make sure that all body creases and crevices are rinsed well. Dry off with a clean towel. Do not put any substances on your body afterward--such as powder, lotion, or perfume--unless you are told to do so by your health care provider. Only use lotions that are recommended by the manufacturer. Put on clean clothes or pajamas. If it is the night before your surgery, sleep in clean sheets.  During a sponge bath Follow these steps when using CHG solution during a sponge bath (unless your health care provider gives you different instructions): Use your normal soap and shampoo to wash your face and hair. Pour the CHG onto a clean washcloth. Starting at your neck, lather your body down to your toes. Make sure you follow  these instructions: If you will be having surgery, pay special attention to the part of your body where you will be having surgery. Scrub this area for at least 1 minute. Do not use CHG on your head or face. If the solution gets into your ears or eyes, rinse them well with water. Avoid your genital area. Avoid any areas of skin that have broken skin, cuts, or scrapes. Scrub your back and under  your arms. Make sure to wash skin folds. Let the lather sit on your skin for 1-2 minutes or as long as told by your health care provider. Using a different clean, wet washcloth, thoroughly rinse your entire body. Make sure that all body creases and crevices are rinsed well. Dry off with a clean towel. Do not put any substances on your body afterward--such as powder, lotion, or perfume--unless you are told to do so by your health care provider. Only use lotions that are recommended by the manufacturer. Put on clean clothes or pajamas. If it is the night before your surgery, sleep in clean sheets. How to use CHG prepackaged cloths Only use CHG cloths as told by your health care provider, and follow the instructions on the label. Use the CHG cloth on clean, dry skin. Do not use the CHG cloth on your head or face unless your health care provider tells you to. When washing with the CHG cloth: Avoid your genital area. Avoid any areas of skin that have broken skin, cuts, or scrapes. Before surgery Follow these steps when using a CHG cloth to clean before surgery (unless your health care provider gives you different instructions): Using the CHG cloth, vigorously scrub the part of your body where you will be having surgery. Scrub using a back-and-forth motion for 3 minutes. The area on your body should be completely wet with CHG when you are done scrubbing. Do not rinse. Discard the cloth and let the area air-dry. Do not put any substances on the area afterward, such as powder, lotion, or perfume. Put on clean  clothes or pajamas. If it is the night before your surgery, sleep in clean sheets.  For general bathing Follow these steps when using CHG cloths for general bathing (unless your health care provider gives you different instructions). Use a separate CHG cloth for each area of your body. Make sure you wash between any folds of skin and between your fingers and toes. Wash your body in the following order, switching to a new cloth after each step: The front of your neck, shoulders, and chest. Both of your arms, under your arms, and your hands. Your stomach and groin area, avoiding the genitals. Your right leg and foot. Your left leg and foot. The back of your neck, your back, and your buttocks. Do not rinse. Discard the cloth and let the area air-dry. Do not put any substances on your body afterward--such as powder, lotion, or perfume--unless you are told to do so by your health care provider. Only use lotions that are recommended by the manufacturer. Put on clean clothes or pajamas. Contact a health care provider if: Your skin gets irritated after scrubbing. You have questions about using your solution or cloth. You swallow any chlorhexidine. Call your local poison control center (1-623-132-5258 in the [place] Get help right away if: Your eyes itch badly, or they become very red or swollen. Your skin itches badly and is red or swollen. Your hearing changes. You have trouble seeing. You have swelling or tingling in your mouth or throat. You have trouble breathing. These symptoms may represent a serious problem that is an emergency. Do not wait to see if the symptoms will go away. Get medical help right away. Call your local emergency services (911 in the [place] Do not drive yourself to the hospital. Summary Chlorhexidine gluconate (CHG) is a germ-killing (antiseptic) solution that is used to clean the skin. Cleaning your skin with CHG may help to lower  your risk for infection. You may be given  CHG to use for bathing. It may be in a bottle or in a prepackaged cloth to use on your skin. Carefully follow your health care provider's instructions and the instructions on the product label. Do not use CHG if you have a chlorhexidine allergy. Contact your health care provider if your skin gets irritated after scrubbing. This information is not intended to replace advice given to you by your health care provider. Make sure you discuss any questions you have with your health care provider. Document Revised: 03/21/2022 Document Reviewed: 02/01/2021 Elsevier Patient Education  [place].

## 2022-11-03 ENCOUNTER — Encounter (HOSPITAL_COMMUNITY)
Admission: RE | Admit: 2022-11-03 | Discharge: 2022-11-03 | Disposition: A | Discharge status: Home / Self Care | Payer: Medicare PPO | Source: Ambulatory Visit | Attending: General Surgery | Admitting: General Surgery

## 2022-11-03 ENCOUNTER — Ambulatory Visit (INDEPENDENT_AMBULATORY_CARE_PROVIDER_SITE_OTHER): Payer: Medicare PPO

## 2022-11-03 ENCOUNTER — Encounter (HOSPITAL_COMMUNITY): Payer: Self-pay

## 2022-11-03 VITALS — BP 152/52 | HR 81 | Temp 97.7°F | Resp 18 | Ht 70.5 in | Wt 183.0 lb

## 2022-11-03 DIAGNOSIS — E875 Hyperkalemia: Secondary | ICD-10-CM

## 2022-11-03 DIAGNOSIS — D696 Thrombocytopenia, unspecified: Secondary | ICD-10-CM

## 2022-11-03 DIAGNOSIS — Z01818 Encounter for other preprocedural examination: Secondary | ICD-10-CM

## 2022-11-03 DIAGNOSIS — M5416 Radiculopathy, lumbar region: Secondary | ICD-10-CM | POA: Diagnosis not present

## 2022-11-03 DIAGNOSIS — Z01812 Encounter for preprocedural laboratory examination: Secondary | ICD-10-CM | POA: Diagnosis not present

## 2022-11-03 DIAGNOSIS — Z23 Encounter for immunization: Secondary | ICD-10-CM

## 2022-11-03 HISTORY — DX: Anxiety disorder, unspecified: F41.9

## 2022-11-03 HISTORY — DX: Gastro-esophageal reflux disease without esophagitis: K21.9

## 2022-11-03 HISTORY — DX: Anemia, unspecified: D64.9

## 2022-11-03 LAB — CBC WITH DIFFERENTIAL/PLATELET
Abs Immature Granulocytes: 0.02 10*3/uL (ref 0.00–0.07)
Basophils Absolute: 0 10*3/uL (ref 0.0–0.1)
Basophils Relative: 1 %
Eosinophils Absolute: 0.2 10*3/uL (ref 0.0–0.5)
Eosinophils Relative: 4 %
HCT: 35.8 % — ABNORMAL LOW (ref 39.0–52.0)
Hemoglobin: 12.4 g/dL — ABNORMAL LOW (ref 13.0–17.0)
Immature Granulocytes: 0 %
Lymphocytes Relative: 32 %
Lymphs Abs: 1.8 10*3/uL (ref 0.7–4.0)
MCH: 31.8 pg (ref 26.0–34.0)
MCHC: 34.6 g/dL (ref 30.0–36.0)
MCV: 91.8 fL (ref 80.0–100.0)
Monocytes Absolute: 0.4 10*3/uL (ref 0.1–1.0)
Monocytes Relative: 8 %
Neutro Abs: 3.2 10*3/uL (ref 1.7–7.7)
Neutrophils Relative %: 55 %
Platelets: 141 10*3/uL — ABNORMAL LOW (ref 150–400)
RBC: 3.9 MIL/uL — ABNORMAL LOW (ref 4.22–5.81)
RDW: 12.4 % (ref 11.5–15.5)
WBC: 5.8 10*3/uL (ref 4.0–10.5)
nRBC: 0 % (ref 0.0–0.2)

## 2022-11-03 LAB — BASIC METABOLIC PANEL
Anion gap: 6 (ref 5–15)
BUN: 20 mg/dL (ref 8–23)
CO2: 27 mmol/L (ref 22–32)
Calcium: 9 mg/dL (ref 8.9–10.3)
Chloride: 103 mmol/L (ref 98–111)
Creatinine, Ser: 1.07 mg/dL (ref 0.61–1.24)
GFR, Estimated: >60 mL/min (ref >60)
Glucose, Bld: 91 mg/dL (ref 70–99)
Potassium: 4.5 mmol/L (ref 3.5–5.1)
Sodium: 136 mmol/L (ref 135–145)

## 2022-11-03 LAB — TYPE AND SCREEN
ABO/RH(D): A POS
Antibody Screen: NEGATIVE

## 2022-11-07 ENCOUNTER — Other Ambulatory Visit: Payer: Self-pay

## 2022-11-07 ENCOUNTER — Ambulatory Visit (HOSPITAL_BASED_OUTPATIENT_CLINIC_OR_DEPARTMENT_OTHER): Payer: Medicare PPO | Admitting: Anesthesiology

## 2022-11-07 ENCOUNTER — Ambulatory Visit (HOSPITAL_COMMUNITY)
Admission: RE | Admit: 2022-11-07 | Discharge: 2022-11-07 | Disposition: A | Discharge status: Home / Self Care | Payer: Medicare PPO | Attending: General Surgery | Admitting: General Surgery

## 2022-11-07 ENCOUNTER — Ambulatory Visit (HOSPITAL_COMMUNITY): Payer: Medicare PPO | Admitting: Anesthesiology

## 2022-11-07 ENCOUNTER — Encounter (HOSPITAL_COMMUNITY)
Admission: RE | Disposition: A | Discharge status: Home / Self Care | Payer: Self-pay | Source: Home / Self Care | Attending: General Surgery

## 2022-11-07 DIAGNOSIS — M1990 Unspecified osteoarthritis, unspecified site: Secondary | ICD-10-CM | POA: Diagnosis not present

## 2022-11-07 DIAGNOSIS — I10 Essential (primary) hypertension: Secondary | ICD-10-CM | POA: Insufficient documentation

## 2022-11-07 DIAGNOSIS — N400 Enlarged prostate without lower urinary tract symptoms: Secondary | ICD-10-CM | POA: Diagnosis not present

## 2022-11-07 DIAGNOSIS — K439 Ventral hernia without obstruction or gangrene: Secondary | ICD-10-CM | POA: Diagnosis not present

## 2022-11-07 DIAGNOSIS — D649 Anemia, unspecified: Secondary | ICD-10-CM

## 2022-11-07 DIAGNOSIS — E785 Hyperlipidemia, unspecified: Secondary | ICD-10-CM | POA: Diagnosis not present

## 2022-11-07 DIAGNOSIS — D759 Disease of blood and blood-forming organs, unspecified: Secondary | ICD-10-CM | POA: Insufficient documentation

## 2022-11-07 DIAGNOSIS — F419 Anxiety disorder, unspecified: Secondary | ICD-10-CM | POA: Diagnosis not present

## 2022-11-07 HISTORY — PX: XI ROBOTIC ASSISTED VENTRAL HERNIA: SHX6789

## 2022-11-07 SURGERY — REPAIR, HERNIA, VENTRAL, ROBOT-ASSISTED
Anesthesia: General | Site: Abdomen

## 2022-11-07 MED ORDER — LIDOCAINE HCL (CARDIAC) PF 100 MG/5ML IV SOSY
PREFILLED_SYRINGE | INTRAVENOUS | Status: DC | PRN
  Administered 2022-11-07: 100 mg via INTRAVENOUS

## 2022-11-07 MED ORDER — ROCURONIUM BROMIDE 10 MG/ML (PF) SYRINGE
PREFILLED_SYRINGE | INTRAVENOUS | Status: AC
  Filled 2022-11-07: qty 10

## 2022-11-07 MED ORDER — METOPROLOL TARTRATE 5 MG/5ML IV SOLN
INTRAVENOUS | Status: DC | PRN
  Administered 2022-11-07: 3 mg via INTRAVENOUS

## 2022-11-07 MED ORDER — FENTANYL CITRATE (PF) 250 MCG/5ML IJ SOLN
INTRAMUSCULAR | Status: AC
  Filled 2022-11-07: qty 5

## 2022-11-07 MED ORDER — CHLORHEXIDINE GLUCONATE CLOTH 2 % EX PADS: 6.0000 | MEDICATED_PAD | Freq: Once | CUTANEOUS | Status: DC

## 2022-11-07 MED ORDER — ONDANSETRON HCL 4 MG PO TABS: 4.0000 mg | ORAL_TABLET | Freq: Three times a day (TID) | ORAL | 1 refills | Status: DC | PRN

## 2022-11-07 MED ORDER — LACTATED RINGERS IV SOLN: INTRAVENOUS | Status: DC | PRN

## 2022-11-07 MED ORDER — FENTANYL CITRATE (PF) 100 MCG/2ML IJ SOLN
INTRAMUSCULAR | Status: DC | PRN
  Administered 2022-11-07: 100 ug via INTRAVENOUS

## 2022-11-07 MED ORDER — CEFAZOLIN SODIUM-DEXTROSE 2-4 GM/100ML-% IV SOLN
2.0000 g | INTRAVENOUS | Status: AC
  Administered 2022-11-07: 2 g via INTRAVENOUS
  Filled 2022-11-07: qty 100

## 2022-11-07 MED ORDER — PROPOFOL 10 MG/ML IV BOLUS
INTRAVENOUS | Status: DC | PRN
  Administered 2022-11-07: 120 mg via INTRAVENOUS

## 2022-11-07 MED ORDER — ROCURONIUM BROMIDE 100 MG/10ML IV SOLN
INTRAVENOUS | Status: DC | PRN
  Administered 2022-11-07: 60 mg via INTRAVENOUS
  Administered 2022-11-07: 20 mg via INTRAVENOUS

## 2022-11-07 MED ORDER — ONDANSETRON HCL 4 MG/2ML IJ SOLN
INTRAMUSCULAR | Status: DC | PRN
  Administered 2022-11-07: 4 mg via INTRAVENOUS

## 2022-11-07 MED ORDER — DEXAMETHASONE SODIUM PHOSPHATE 10 MG/ML IJ SOLN
INTRAMUSCULAR | Status: DC | PRN
  Administered 2022-11-07: 10 mg via INTRAVENOUS

## 2022-11-07 MED ORDER — PHENYLEPHRINE HCL (PRESSORS) 10 MG/ML IV SOLN
INTRAVENOUS | Status: DC | PRN
  Administered 2022-11-07: 80 ug via INTRAVENOUS
  Administered 2022-11-07 (×3): 160 ug via INTRAVENOUS

## 2022-11-07 MED ORDER — HYDROMORPHONE HCL 1 MG/ML IJ SOLN
0.2500 mg | INTRAMUSCULAR | Status: DC | PRN
  Administered 2022-11-07: 0.5 mg via INTRAVENOUS
  Filled 2022-11-07: qty 0.5

## 2022-11-07 MED ORDER — EPHEDRINE SULFATE (PRESSORS) 50 MG/ML IJ SOLN
INTRAMUSCULAR | Status: DC | PRN
  Administered 2022-11-07: 10 mg via INTRAVENOUS

## 2022-11-07 MED ORDER — ONDANSETRON HCL 4 MG/2ML IJ SOLN
INTRAMUSCULAR | Status: AC
  Filled 2022-11-07: qty 2

## 2022-11-07 MED ORDER — DEXAMETHASONE SODIUM PHOSPHATE 10 MG/ML IJ SOLN
INTRAMUSCULAR | Status: AC
  Filled 2022-11-07: qty 1

## 2022-11-07 MED ORDER — ORAL CARE MOUTH RINSE: 15.0000 mL | Freq: Once | OROMUCOSAL | Status: AC

## 2022-11-07 MED ORDER — CHLORHEXIDINE GLUCONATE 0.12 % MT SOLN
15.0000 mL | Freq: Once | OROMUCOSAL | Status: AC
  Administered 2022-11-07: 15 mL via OROMUCOSAL

## 2022-11-07 MED ORDER — CEFAZOLIN SODIUM-DEXTROSE 2-4 GM/100ML-% IV SOLN
INTRAVENOUS | Status: AC
  Filled 2022-11-07: qty 100

## 2022-11-07 MED ORDER — SUGAMMADEX SODIUM 200 MG/2ML IV SOLN
INTRAVENOUS | Status: DC | PRN
  Administered 2022-11-07 (×2): 200 mg via INTRAVENOUS

## 2022-11-07 MED ORDER — STERILE WATER FOR IRRIGATION IR SOLN
Status: DC | PRN
  Administered 2022-11-07: 500 mL

## 2022-11-07 MED ORDER — LACTATED RINGERS IV SOLN: INTRAVENOUS | Status: DC

## 2022-11-07 MED ORDER — BUPIVACAINE LIPOSOME 1.3 % IJ SUSP
INTRAMUSCULAR | Status: DC | PRN
  Administered 2022-11-07: 20 mL

## 2022-11-07 MED ORDER — BUPIVACAINE LIPOSOME 1.3 % IJ SUSP
INTRAMUSCULAR | Status: AC
  Filled 2022-11-07: qty 20

## 2022-11-07 MED ORDER — ONDANSETRON HCL 4 MG/2ML IJ SOLN: 4.0000 mg | Freq: Once | INTRAMUSCULAR | Status: DC | PRN

## 2022-11-07 MED ORDER — OXYCODONE HCL 5 MG PO TABS: 5.0000 mg | ORAL_TABLET | ORAL | 0 refills | Status: DC | PRN

## 2022-11-07 MED ORDER — LIDOCAINE HCL (PF) 2 % IJ SOLN
INTRAMUSCULAR | Status: AC
  Filled 2022-11-07: qty 5

## 2022-11-07 MED ORDER — PHENYLEPHRINE 80 MCG/ML (10ML) SYRINGE FOR IV PUSH (FOR BLOOD PRESSURE SUPPORT)
PREFILLED_SYRINGE | INTRAVENOUS | Status: AC
  Filled 2022-11-07: qty 20

## 2022-11-07 SURGICAL SUPPLY — 43 items
ADH SKN CLS APL DERMABOND .7 (GAUZE/BANDAGES/DRESSINGS) ×1
APL PRP STRL LF DISP 70% ISPRP (MISCELLANEOUS) ×1
BLADE SURG 15 STRL LF DISP TIS (BLADE) IMPLANT
BLADE SURG 15 STRL SS (BLADE) ×1
CHLORAPREP W/TINT 26 (MISCELLANEOUS) IMPLANT
COVER LIGHT HANDLE STERIS (MISCELLANEOUS) IMPLANT
COVER TIP SHEARS 8 DVNC (MISCELLANEOUS) ×1 IMPLANT
COVER TIP SHEARS 8MM DA VINCI (MISCELLANEOUS) ×1
COVER WAND RF STERILE (DRAPES) ×1 IMPLANT
DERMABOND ADVANCED .7 DNX12 (GAUZE/BANDAGES/DRESSINGS) ×1 IMPLANT
DRAPE ARM DVNC X/XI (DISPOSABLE) ×3 IMPLANT
DRAPE COLUMN DVNC XI (DISPOSABLE) ×1 IMPLANT
DRAPE DA VINCI XI ARM (DISPOSABLE) ×3
DRAPE DA VINCI XI COLUMN (DISPOSABLE) ×1
ELECT REM PT RETURN 9FT ADLT (ELECTROSURGICAL) ×1
ELECTRODE REM PT RTRN 9FT ADLT (ELECTROSURGICAL) ×1 IMPLANT
GLOVE BIO SURGEON STRL SZ 6.5 (GLOVE) ×2 IMPLANT
GLOVE BIOGEL PI IND STRL 6.5 (GLOVE) ×2 IMPLANT
GLOVE ECLIPSE 6.5 STRL STRAW (GLOVE) IMPLANT
GOWN STRL REUS W/ TWL LRG LVL3 (GOWN DISPOSABLE) ×3 IMPLANT
GOWN STRL REUS W/TWL LRG LVL3 (GOWN DISPOSABLE) ×3
MESH VENTRALIGHT ST 4.5IN (Mesh General) IMPLANT
NDL HYPO 21X1.5 SAFETY (NEEDLE) IMPLANT
NDL INSUFFLATION 14GA 120MM (NEEDLE) ×1 IMPLANT
NEEDLE HYPO 21X1.5 SAFETY (NEEDLE) ×1 IMPLANT
NEEDLE HYPO 22GX1.5 SAFETY (NEEDLE) ×1 IMPLANT
NEEDLE INSUFFLATION 14GA 120MM (NEEDLE) ×1 IMPLANT
OBTURATOR OPTICAL STANDARD 8MM (TROCAR) ×1
OBTURATOR OPTICAL STND 8 DVNC (TROCAR) ×1
OBTURATOR OPTICALSTD 8 DVNC (TROCAR) ×1 IMPLANT
PACK LAP CHOLECYSTECTOMY (MISCELLANEOUS) ×1 IMPLANT
SEAL CANN UNIV 5-8 DVNC XI (MISCELLANEOUS) ×3 IMPLANT
SEAL XI 5MM-8MM UNIVERSAL (MISCELLANEOUS) ×3
SET TUBE SMOKE EVAC HIGH FLOW (TUBING) ×1 IMPLANT
SUT MNCRL AB 4-0 PS2 18 (SUTURE) ×1 IMPLANT
SUT STRATAFIX 0 PDS+ CT-2 23 (SUTURE) ×1
SUT V-LOC 90 ABS 3-0 VLT  V-20 (SUTURE) ×4
SUT V-LOC 90 ABS 3-0 VLT V-20 (SUTURE) IMPLANT
SUTURE STRATFX 0 PDS+ CT-2 23 (SUTURE) IMPLANT
SYR 30ML LL (SYRINGE) IMPLANT
TAPE TRANSPORE STRL 2 31045 (GAUZE/BANDAGES/DRESSINGS) ×1 IMPLANT
TRAY FOLEY MTR SLVR 16FR STAT (SET/KITS/TRAYS/PACK) IMPLANT
WATER STERILE IRR 500ML POUR (IV SOLUTION) ×1 IMPLANT

## 2022-11-07 NOTE — Transfer of Care (Addendum)
Immediate Anesthesia Transfer of Care Note  Patient: Paul Parks  Procedure(s) Performed: XI ROBOTIC ASSISTED VENTRAL HERNIA W/ MESH (Abdomen)  Patient Location: PACU  Anesthesia Type:General  Level of Consciousness: awake, alert , oriented, and patient cooperative  Airway & Oxygen Therapy: Patient Spontanous Breathing and Patient connected to nasal cannula oxygen  Post-op Assessment: Report given to RN and Post -op Vital signs reviewed and stable  Post vital signs: Reviewed and stable  Last Vitals:  Vitals Value Taken Time  BP 174/66 11/07/22 1440  Temp 36.4 C 11/07/22 1440  Pulse 61 11/07/22 1444  Resp 14 11/07/22 1444  SpO2 100 % 11/07/22 1444  Vitals shown include unvalidated device data.  Last Pain:  Vitals:   11/07/22 1023  TempSrc: Oral  PainSc: 0-No pain      Patients Stated Pain Goal: 7 (83/[id number])  Complications: No notable events documented.

## 2022-11-07 NOTE — Discharge Instructions (Signed)
Discharge Robotic Assisted Laparoscopic Surgery Instructions:  Common Complaints: Right shoulder pain is common after laparoscopic surgery.  This is secondary to the gas used in the surgery being trapped under the diaphragm.  Walk to help your body absorb the gas. This will improve in a few days. Pain at the port sites are common, especially the larger port sites. This will improve with time.  Some nausea is common and poor appetite. The main goal is to stay hydrated the first few days after surgery.   Diet/ Activity: Diet as tolerated. You may not have an appetite, but it is important to stay hydrated.  Drink 64 ounces of water a day. Your appetite will return with time.  Shower per your regular routine daily.  Do not take hot showers. Take warm showers that are less than 10 minutes. Rest and listen to your body, but do not remain in bed all day.  Walk everyday for at least 15-20 minutes. Deep cough and move around every 1-2 hours in the first few days after surgery.  Do not lift > 10 lbs, perform excessive bending, pushing, pulling, squatting for 4-6 weeks after surgery.  Do not pick at the dermabond glue on your incision sites.  This glue film will remain in place for 1-2 weeks and will start to peel off.  Do not place lotions or balms on your incision unless instructed to specifically by Dr. Constance Haw.   Pain Expectations and Narcotics: -After surgery you will have pain associated with your incisions and this is normal. The pain is muscular and nerve pain, and will get better with time. -You are encouraged and expected to take non narcotic medications like tylenol and ibuprofen (when able) to treat pain as multiple modalities can aid with pain treatment. -Narcotics are only used when pain is severe or there is breakthrough pain. -You are not expected to have a pain score of 0 after surgery, as we cannot prevent pain. A pain score of 3-4 that allows you to be functional, move, walk, and  tolerate some activity is the goal. The pain will continue to improve over the days after surgery and is dependent on your surgery. -Due to [place] law, we are only able to give a certain amount of pain medication to treat post operative pain, and we only give additional narcotics on a patient by patient basis.  -For most laparoscopic surgery, studies have shown that the majority of patients only need 10-15 narcotic pills, and for open surgeries most patients only need 15-20.   -Having appropriate expectations of pain and knowledge of pain management with non narcotics is important as we do not want anyone to become addicted to narcotic pain medication.  -Using ice packs in the first 48 hours and heating pads after 48 hours, wearing an abdominal binder (when recommended), and using over the counter medications are all ways to help with pain management.   -Simple acts like meditation and mindfulness practices after surgery can also help with pain control and research has proven the benefit of these practices.  Medication: Take tylenol and ibuprofen as needed for pain control, alternating every 4-6 hours.  Example:  Tylenol '1000mg'$  @ 6am, 12noon, 6pm, 39mdnight (Do not exceed '4000mg'$  of tylenol a day). Ibuprofen '800mg'$  @ 9am, 3pm, 9pm, 3am (Do not exceed '3600mg'$  of ibuprofen a day).  Take Roxicodone for breakthrough pain every 4 hours.  Take Colace for constipation related to narcotic pain medication. If you do not have a bowel movement in  2 days, take Miralax over the counter.  Drink plenty of water to also prevent constipation.   Contact Information: If you have questions or concerns, please call our office, [phone], Monday- Thursday 8AM-5PM and Friday 8AM-12Noon.  If it is after hours or on the weekend, please call [facility] Main Number, [phone], [phone], and ask to speak to the surgeon on call for Dr. Constance Haw at [facility].

## 2022-11-07 NOTE — Anesthesia Preprocedure Evaluation (Addendum)
Anesthesia Evaluation  Patient identified by MRN, date of birth, ID band Patient awake    Reviewed: Allergy & Precautions, H&P , NPO status , Patient's Chart, lab work & pertinent test results  Airway Mallampati: III  TM Distance: <3 FB Neck ROM: Full  Mouth opening: Limited Mouth Opening  Dental  (+) Dental Advisory Given, Caps Crowns :   Pulmonary neg pulmonary ROS   Pulmonary exam normal breath sounds clear to auscultation       Cardiovascular hypertension, Pt. on medications  Rhythm:Irregular Rate:Abnormal + Systolic murmurs    Neuro/Psych  PSYCHIATRIC DISORDERS Anxiety     negative neurological ROS     GI/Hepatic Neg liver ROS,GERD  Controlled,,  Endo/Other  negative endocrine ROS    Renal/GU negative Renal ROS  negative genitourinary   Musculoskeletal  (+) Arthritis , Osteoarthritis,    Abdominal   Peds negative pediatric ROS (+)  Hematology  (+) Blood dyscrasia, anemia   Anesthesia Other Findings   Reproductive/Obstetrics negative OB ROS                             Anesthesia Physical Anesthesia Plan  ASA: 3  Anesthesia Plan: General   Post-op Pain Management: Dilaudid IV   Induction: Intravenous  PONV Risk Score and Plan: 4 or greater and Ondansetron and Dexamethasone  Airway Management Planned: Oral ETT and Video Laryngoscope Planned  Additional Equipment:   Intra-op Plan:   Post-operative Plan: Extubation in OR  Informed Consent: I have reviewed the patients History and Physical, chart, labs and discussed the procedure including the risks, benefits and alternatives for the proposed anesthesia with the patient or authorized representative who has indicated his/her understanding and acceptance.     Dental advisory given  Plan Discussed with: CRNA and Surgeon  Anesthesia Plan Comments:        Anesthesia Quick Evaluation

## 2022-11-07 NOTE — Progress Notes (Signed)
[facility]  Updated wife. Rx sent to [facility]. Will see in 4 weeks.  No lifting over 10 lbs for next 4-6 weeks.  Curlene Labrum, MD [facility] [address] [place], [place] [address] [phone] (office)

## 2022-11-07 NOTE — Interval H&P Note (Signed)
History and Physical Interval Note:  11/07/2022 10:34 AM  Paul Parks  has presented today for surgery, with the diagnosis of VENTRAL HERNIA.  The various methods of treatment have been discussed with the patient and family. After consideration of risks, benefits and other options for treatment, the patient has consented to  Procedure(s) with comments: XI [place] (N/A) - per Jerrye Beavers ok to follow his robot case as a surgical intervention.  The patient's history has been reviewed, patient examined, no change in status, stable for surgery.  I have reviewed the patient's chart and labs.  Questions were answered to the patient's satisfaction.     Virl Cagey

## 2022-11-07 NOTE — Op Note (Addendum)
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 Constance Haw, MD   Assistants: No qualified resident was available    Anesthesia: General endotracheal   Anesthesiologist: Dr. Charna Elizabeth    Specimens: None    Estimated Blood Loss: Minimal   Blood Replacement: None    Complications: None   Wound Class: Clean    Operative Indications: Paul Parks is a very sweet 86 yo who has a spigelian hernia noted on CT scan. We discussed robotic assisted laparoscopic hernia repair and mesh use and risk of bleeding, infection, recurrence and possible injury to the bowel.   Findings: 1.5 defect Tension free repair achieved with Ventralight ST Circular 11.4 cm mesh and suture Adequate hemostasis    Procedure: The patient was brought to the operating room and general anesthesia was induced. A time-out was completed verifying correct patient, procedure, site, positioning, and implant(s) and/or special equipment prior to beginning this procedure. Antibiotics were administered prior to making the incision. SCDs placed. The anterior abdominal wall was prepped and draped in the standard sterile fashion.   A supraumbilical site was used and a stab incision was made. A towel clip was used to elevate the abdominal wall. A Veress needle placed and the saline drop test was performed. The abdomen was insufflated to 15cm without issues. Lateral ports on the right side were placed using the optiview ports, placing the 38m central camera port first and two addition 872mports on either side with adequate space between. The Ventralight ST 11.4 cm circular mesh,1 0 Stratafix, and 4 VLock 3-0 sutures were placed in the abdominal cavity under direct visualization. The Xi[place]obot then docked into  place.  The hernia was noted to be in the left lower abdomen. The hernia contents noted and reduced with combination of blunt, sharp dissection with scissors and fenestrated forceps.  Hemostasis achieved throughout this portion.  Once all hernia contents reduced, there was noted to be a 1.5 hernia defect.  Any fat overlying the peritoneal layer was taken down creating flaps to clear the space for adequate fixation of the mesh.   A transfacial suture with 0 Stratafix used to primarily close defect under minimal tension. The Stratafix was then passed through the center of the mesh (coated side out) to secured the mesh to the abdominal wall centered over the defect.  The mesh was then circumferentially sutured into the anterior abdominal wall using 3-0 VLock x4 (one was accidentally cut and had to be discarded).  Any bleeding noted during this portion was no longer actively bleeding by end of securing mesh and tightening the suture. The suture was cut to leave no stray tails.  All remaining suture and needles were removed from the abdomen.   The robot was undocked.  The abdomen was then de-sufflated and the ports were removed.  All skin incisions closed with subcuticular 4-0 Monocryl and dermabond. Final inspection revealed acceptable hemostasis.   All counts were correct at the end of the case. The patient was awakened from anesthesia and extubated without complication.  The patient went to the PACU in stable condition.   LiCurlene LabrumMD [facility] [place] [phone])

## 2022-11-07 NOTE — Anesthesia Procedure Notes (Signed)
Procedure Name: Intubation Date/Time: 11/07/2022 12:31 PM  Performed by: Denese Killings, MDPre-anesthesia Checklist: Patient identified, Emergency Drugs available, Suction available and Patient being monitored Patient Re-evaluated:Patient Re-evaluated prior to induction Oxygen Delivery Method: Circle system utilized Preoxygenation: Pre-oxygenation with 100% oxygen Induction Type: IV induction Ventilation: Mask ventilation without difficulty and Oral airway inserted - appropriate to patient size Laryngoscope Size: Miller and 3 Grade View: Grade I Tube type: Oral Tube size: 7.5 mm Number of attempts: 1 Airway Equipment and Method: Stylet and Oral airway Placement Confirmation: ETT inserted through vocal cords under direct vision, positive ETCO2 and breath sounds checked- equal and bilateral Secured at: 23 cm Tube secured with: Tape Dental Injury: Teeth and Oropharynx as per pre-operative assessment

## 2022-11-07 NOTE — Anesthesia Postprocedure Evaluation (Signed)
Anesthesia Post Note  Patient: Paul Parks  Procedure(s) Performed: XI ROBOTIC ASSISTED VENTRAL HERNIA W/ MESH (Abdomen)  Patient location during evaluation: Phase II Anesthesia Type: General Level of consciousness: awake and alert and oriented Pain management: pain level controlled Vital Signs Assessment: post-procedure vital signs reviewed and stable Respiratory status: spontaneous breathing, nonlabored ventilation and respiratory function stable Cardiovascular status: blood pressure returned to baseline and stable Postop Assessment: no apparent nausea or vomiting Anesthetic complications: no  No notable events documented.   Last Vitals:  Vitals:   11/07/22 1530 11/07/22 1550  BP: 136/66 (!) 152/76  Pulse:  72  Resp: 11 16  Temp:  36.5 C  SpO2: 96% 95%    Last Pain:  Vitals:   11/07/22 1550  TempSrc: Oral  PainSc: 3                  Lynell Greenhouse C Nimai Burbach

## 2022-11-09 ENCOUNTER — Encounter: Payer: Medicare PPO | Admitting: Family Medicine

## 2022-11-10 ENCOUNTER — Encounter (HOSPITAL_COMMUNITY): Payer: Self-pay | Admitting: General Surgery

## 2022-12-07 ENCOUNTER — Encounter: Payer: Self-pay | Admitting: General Surgery

## 2022-12-07 ENCOUNTER — Other Ambulatory Visit: Payer: Self-pay

## 2022-12-07 ENCOUNTER — Ambulatory Visit (INDEPENDENT_AMBULATORY_CARE_PROVIDER_SITE_OTHER): Payer: Medicare PPO | Admitting: General Surgery

## 2022-12-07 VITALS — BP 139/77 | HR 48 | Temp 98.3°F | Resp 16 | Ht 70.5 in | Wt 183.0 lb

## 2022-12-07 DIAGNOSIS — K439 Ventral hernia without obstruction or gangrene: Secondary | ICD-10-CM

## 2022-12-07 NOTE — Progress Notes (Signed)
[facility]  Doing well. Minimal pain now. Did have some soreness after surgery but improved. Eating and having Bms. Port sites healed.  BP 139/77   Pulse (!) 48   Temp 98.3 F (36.8 C) (Oral)   Resp 16   Ht 5' 10.5" (1.791 m)   Wt 183 lb (83 kg)   SpO2 95%   BMI 25.89 kg/m  Port sites healed No signs of LLQ hernia  Patient s/p robotic hernia repair with mesh on the left spigelian hernia. Doing well.  Slowly start to increase activity over the next 2 weeks. Lifting more each week.  Continue to bend and stretch. Can be back to regular activity by 6 weeks out from surgery.  Call with any concerns or questions.  PRN follow up   Curlene Labrum, MD [facility] [address] [place], [place] [address] [phone] (office)

## 2022-12-07 NOTE — Patient Instructions (Addendum)
Slowly start to increase activity over the next 2 weeks. Lifting more each week. Continue to bend and stretch. Can be back to regular activity by 6 weeks out from surgery.  Call with any concerns or questions.

## 2022-12-15 ENCOUNTER — Other Ambulatory Visit: Payer: Self-pay | Admitting: Internal Medicine

## 2022-12-20 DIAGNOSIS — Z01812 Encounter for preprocedural laboratory examination: Secondary | ICD-10-CM | POA: Diagnosis not present

## 2022-12-20 DIAGNOSIS — R350 Frequency of micturition: Secondary | ICD-10-CM | POA: Diagnosis not present

## 2022-12-20 DIAGNOSIS — I493 Ventricular premature depolarization: Secondary | ICD-10-CM | POA: Diagnosis not present

## 2022-12-20 DIAGNOSIS — E785 Hyperlipidemia, unspecified: Secondary | ICD-10-CM | POA: Diagnosis not present

## 2022-12-20 DIAGNOSIS — N401 Enlarged prostate with lower urinary tract symptoms: Secondary | ICD-10-CM | POA: Diagnosis not present

## 2022-12-20 DIAGNOSIS — I10 Essential (primary) hypertension: Secondary | ICD-10-CM | POA: Diagnosis not present

## 2022-12-27 DIAGNOSIS — D649 Anemia, unspecified: Secondary | ICD-10-CM | POA: Diagnosis not present

## 2022-12-27 DIAGNOSIS — I493 Ventricular premature depolarization: Secondary | ICD-10-CM | POA: Diagnosis not present

## 2022-12-27 DIAGNOSIS — E785 Hyperlipidemia, unspecified: Secondary | ICD-10-CM | POA: Diagnosis not present

## 2022-12-27 DIAGNOSIS — N411 Chronic prostatitis: Secondary | ICD-10-CM | POA: Diagnosis not present

## 2022-12-27 DIAGNOSIS — D696 Thrombocytopenia, unspecified: Secondary | ICD-10-CM | POA: Diagnosis not present

## 2022-12-27 DIAGNOSIS — N410 Acute prostatitis: Secondary | ICD-10-CM | POA: Diagnosis not present

## 2022-12-27 DIAGNOSIS — N401 Enlarged prostate with lower urinary tract symptoms: Secondary | ICD-10-CM | POA: Diagnosis not present

## 2022-12-27 DIAGNOSIS — N138 Other obstructive and reflux uropathy: Secondary | ICD-10-CM | POA: Diagnosis not present

## 2022-12-27 DIAGNOSIS — M545 Low back pain, unspecified: Secondary | ICD-10-CM | POA: Diagnosis not present

## 2022-12-27 DIAGNOSIS — G8929 Other chronic pain: Secondary | ICD-10-CM | POA: Diagnosis not present

## 2022-12-27 DIAGNOSIS — I10 Essential (primary) hypertension: Secondary | ICD-10-CM | POA: Diagnosis not present

## 2022-12-27 DIAGNOSIS — R350 Frequency of micturition: Secondary | ICD-10-CM | POA: Diagnosis not present

## 2022-12-28 DIAGNOSIS — I493 Ventricular premature depolarization: Secondary | ICD-10-CM | POA: Diagnosis not present

## 2022-12-28 DIAGNOSIS — E785 Hyperlipidemia, unspecified: Secondary | ICD-10-CM | POA: Diagnosis not present

## 2022-12-28 DIAGNOSIS — I10 Essential (primary) hypertension: Secondary | ICD-10-CM | POA: Diagnosis not present

## 2022-12-28 DIAGNOSIS — N401 Enlarged prostate with lower urinary tract symptoms: Secondary | ICD-10-CM | POA: Diagnosis not present

## 2022-12-28 DIAGNOSIS — D649 Anemia, unspecified: Secondary | ICD-10-CM | POA: Diagnosis not present

## 2022-12-28 DIAGNOSIS — D696 Thrombocytopenia, unspecified: Secondary | ICD-10-CM | POA: Diagnosis not present

## 2022-12-28 DIAGNOSIS — G8929 Other chronic pain: Secondary | ICD-10-CM | POA: Diagnosis not present

## 2022-12-28 DIAGNOSIS — M545 Low back pain, unspecified: Secondary | ICD-10-CM | POA: Diagnosis not present

## 2022-12-28 DIAGNOSIS — R350 Frequency of micturition: Secondary | ICD-10-CM | POA: Diagnosis not present

## 2023-01-02 DIAGNOSIS — R339 Retention of urine, unspecified: Secondary | ICD-10-CM | POA: Diagnosis not present

## 2023-01-02 DIAGNOSIS — N138 Other obstructive and reflux uropathy: Secondary | ICD-10-CM | POA: Diagnosis not present

## 2023-01-02 DIAGNOSIS — N401 Enlarged prostate with lower urinary tract symptoms: Secondary | ICD-10-CM | POA: Diagnosis not present

## 2023-01-10 ENCOUNTER — Other Ambulatory Visit: Payer: Medicare PPO

## 2023-01-11 ENCOUNTER — Other Ambulatory Visit: Payer: Medicare PPO

## 2023-01-11 DIAGNOSIS — N390 Urinary tract infection, site not specified: Secondary | ICD-10-CM | POA: Diagnosis not present

## 2023-01-16 DIAGNOSIS — Z882 Allergy status to sulfonamides status: Secondary | ICD-10-CM | POA: Diagnosis not present

## 2023-01-16 DIAGNOSIS — Z888 Allergy status to other drugs, medicaments and biological substances status: Secondary | ICD-10-CM | POA: Diagnosis not present

## 2023-01-16 DIAGNOSIS — R399 Unspecified symptoms and signs involving the genitourinary system: Secondary | ICD-10-CM | POA: Diagnosis not present

## 2023-01-16 DIAGNOSIS — R3989 Other symptoms and signs involving the genitourinary system: Secondary | ICD-10-CM | POA: Diagnosis not present

## 2023-01-16 DIAGNOSIS — Z886 Allergy status to analgesic agent status: Secondary | ICD-10-CM | POA: Diagnosis not present

## 2023-01-18 DIAGNOSIS — H401123 Primary open-angle glaucoma, left eye, severe stage: Secondary | ICD-10-CM | POA: Diagnosis not present

## 2023-01-18 DIAGNOSIS — H53462 Homonymous bilateral field defects, left side: Secondary | ICD-10-CM | POA: Diagnosis not present

## 2023-01-18 DIAGNOSIS — H401112 Primary open-angle glaucoma, right eye, moderate stage: Secondary | ICD-10-CM | POA: Diagnosis not present

## 2023-01-20 ENCOUNTER — Telehealth: Payer: Self-pay | Admitting: Family Medicine

## 2023-01-20 NOTE — Telephone Encounter (Signed)
Contacted Troyce Swee to schedule their annual wellness visit. Appointment made for 01/25/2023.  Thank you,  [place] ??[phone]
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
## 2023-01-24 NOTE — Patient Instructions (Incomplete)
Paul Parks , Thank you for taking time to come for your Medicare Wellness Visit. I appreciate your ongoing commitment to your health goals. Please review the following plan we discussed and let me know if I can assist you in the future.   These are the goals we discussed:  Goals      Remain active and recover from prostate surgery        This is a list of the screening recommended for you and due dates:  Health Maintenance  Topic Date Due   Zoster (Shingles) Vaccine (1 of 2) Never done   COVID-19 Vaccine (4 - 2023-24 season) 08/05/2022   Medicare Annual Wellness Visit  01/26/2024   DTaP/Tdap/Td vaccine (3 - Td or Tdap) 01/14/2030   Pneumonia Vaccine  Completed   Flu Shot  Completed   HPV Vaccine  Aged Out   Colon Cancer Screening  Discontinued    Advanced directives: Please bring a copy of your health care power of attorney and living will to the office to be added to your chart at your convenience.   Conditions/risks identified: Aim for 30 minutes of exercise or brisk walking, 6-8 glasses of water, and 5 servings of fruits and vegetables each day.   Next appointment: Follow up in one year for your annual wellness visit.   Preventive Care 62 Years and Older, Male  Preventive care refers to lifestyle choices and visits with your health care provider that can promote health and wellness. What does preventive care include? A yearly physical exam. This is also called an annual well check. Dental exams once or twice a year. Routine eye exams. Ask your health care provider how often you should have your eyes checked. Personal lifestyle choices, including: Daily care of your teeth and gums. Regular physical activity. Eating a healthy diet. Avoiding tobacco and drug use. Limiting alcohol use. Practicing safe sex. Taking low doses of aspirin every day. Taking vitamin and mineral supplements as recommended by your health care provider. What happens during an annual well  check? The services and screenings done by your health care provider during your annual well check will depend on your age, overall health, lifestyle risk factors, and family history of disease. Counseling  Your health care provider may ask you questions about your: Alcohol use. Tobacco use. Drug use. Emotional well-being. Home and relationship well-being. Sexual activity. Eating habits. History of falls. Memory and ability to understand (cognition). Work and work Statistician. Screening  You may have the following tests or measurements: Height, weight, and BMI. Blood pressure. Lipid and cholesterol levels. These may be checked every 5 years, or more frequently if you are over 56 years old. Skin check. Lung cancer screening. You may have this screening every year starting at age 73 if you have a 30-pack-year history of smoking and currently smoke or have quit within the past 15 years. Fecal occult blood test (FOBT) of the stool. You may have this test every year starting at age 65. Flexible sigmoidoscopy or colonoscopy. You may have a sigmoidoscopy every 5 years or a colonoscopy every 10 years starting at age 53. Prostate cancer screening. Recommendations will vary depending on your family history and other risks. Hepatitis C blood test. Hepatitis B blood test. Sexually transmitted disease (STD) testing. Diabetes screening. This is done by checking your blood sugar (glucose) after you have not eaten for a while (fasting). You may have this done every 1-3 years. Abdominal aortic aneurysm (AAA) screening. You may need this if  you are a current or former smoker. Osteoporosis. You may be screened starting at age 50 if you are at high risk. Talk with your health care provider about your test results, treatment options, and if necessary, the need for more tests. Vaccines  Your health care provider may recommend certain vaccines, such as: Influenza vaccine. This is recommended every  year. Tetanus, diphtheria, and acellular pertussis (Tdap, Td) vaccine. You may need a Td booster every 10 years. Zoster vaccine. You may need this after age 61. Pneumococcal 13-valent conjugate (PCV13) vaccine. One dose is recommended after age 71. Pneumococcal polysaccharide (PPSV23) vaccine. One dose is recommended after age 22. Talk to your health care provider about which screenings and vaccines you need and how often you need them. This information is not intended to replace advice given to you by your health care provider. Make sure you discuss any questions you have with your health care provider. Document Released: 12/18/2015 Document Revised: 08/10/2016 Document Reviewed: 09/22/2015 Elsevier Interactive Patient Education  2017 [place] Prevention in the Home Falls can cause injuries. They can happen to people of all ages. There are many things you can do to make your home safe and to help prevent falls. What can I do on the outside of my home? Regularly fix the edges of walkways and driveways and fix any cracks. Remove anything that might make you trip as you walk through a door, such as a raised step or threshold. Trim any bushes or trees on the path to your home. Use bright outdoor lighting. Clear any walking paths of anything that might make someone trip, such as rocks or tools. Regularly check to see if handrails are loose or broken. Make sure that both sides of any steps have handrails. Any raised decks and porches should have guardrails on the edges. Have any leaves, snow, or ice cleared regularly. Use sand or salt on walking paths during winter. Clean up any spills in your garage right away. This includes oil or grease spills. What can I do in the bathroom? Use night lights. Install grab bars by the toilet and in the tub and shower. Do not use towel bars as grab bars. Use non-skid mats or decals in the tub or shower. If you need to sit down in the shower, use a  plastic, non-slip stool. Keep the floor dry. Clean up any water that spills on the floor as soon as it happens. Remove soap buildup in the tub or shower regularly. Attach bath mats securely with double-sided non-slip rug tape. Do not have throw rugs and other things on the floor that can make you trip. What can I do in the bedroom? Use night lights. Make sure that you have a light by your bed that is easy to reach. Do not use any sheets or blankets that are too big for your bed. They should not hang down onto the floor. Have a firm chair that has side arms. You can use this for support while you get dressed. Do not have throw rugs and other things on the floor that can make you trip. What can I do in the kitchen? Clean up any spills right away. Avoid walking on wet floors. Keep items that you use a lot in easy-to-reach places. If you need to reach something above you, use a strong step stool that has a grab bar. Keep electrical cords out of the way. Do not use floor polish or wax that makes floors slippery. If  you must use wax, use non-skid floor wax. Do not have throw rugs and other things on the floor that can make you trip. What can I do with my stairs? Do not leave any items on the stairs. Make sure that there are handrails on both sides of the stairs and use them. Fix handrails that are broken or loose. Make sure that handrails are as long as the stairways. Check any carpeting to make sure that it is firmly attached to the stairs. Fix any carpet that is loose or worn. Avoid having throw rugs at the top or bottom of the stairs. If you do have throw rugs, attach them to the floor with carpet tape. Make sure that you have a light switch at the top of the stairs and the bottom of the stairs. If you do not have them, ask someone to add them for you. What else can I do to help prevent falls? Wear shoes that: Do not have high heels. Have rubber bottoms. Are comfortable and fit you  well. Are closed at the toe. Do not wear sandals. If you use a stepladder: Make sure that it is fully opened. Do not climb a closed stepladder. Make sure that both sides of the stepladder are locked into place. Ask someone to hold it for you, if possible. Clearly mark and make sure that you can see: Any grab bars or handrails. First and last steps. Where the edge of each step is. Use tools that help you move around (mobility aids) if they are needed. These include: Canes. Walkers. Scooters. Crutches. Turn on the lights when you go into a dark area. Replace any light bulbs as soon as they burn out. Set up your furniture so you have a clear path. Avoid moving your furniture around. If any of your floors are uneven, fix them. If there are any pets around you, be aware of where they are. Review your medicines with your doctor. Some medicines can make you feel dizzy. This can increase your chance of falling. Ask your doctor what other things that you can do to help prevent falls. This information is not intended to replace advice given to you by your health care provider. Make sure you discuss any questions you have with your health care provider. Document Released: 09/17/2009 Document Revised: 04/28/2016 Document Reviewed: 12/26/2014 Elsevier Interactive Patient Education  2017 [employer].

## 2023-01-25 ENCOUNTER — Ambulatory Visit (INDEPENDENT_AMBULATORY_CARE_PROVIDER_SITE_OTHER): Payer: Medicare PPO

## 2023-01-25 VITALS — Ht 70.0 in | Wt 182.0 lb

## 2023-01-25 DIAGNOSIS — Z0000 Encounter for general adult medical examination without abnormal findings: Secondary | ICD-10-CM

## 2023-02-09 DIAGNOSIS — M4316 Spondylolisthesis, lumbar region: Secondary | ICD-10-CM | POA: Diagnosis not present

## 2023-02-09 DIAGNOSIS — M5416 Radiculopathy, lumbar region: Secondary | ICD-10-CM | POA: Diagnosis not present

## 2023-02-13 DIAGNOSIS — N3281 Overactive bladder: Secondary | ICD-10-CM | POA: Diagnosis not present

## 2023-02-13 DIAGNOSIS — N529 Male erectile dysfunction, unspecified: Secondary | ICD-10-CM | POA: Diagnosis not present

## 2023-02-15 ENCOUNTER — Encounter: Payer: Medicare PPO | Admitting: Family Medicine

## 2023-02-23 DIAGNOSIS — M5126 Other intervertebral disc displacement, lumbar region: Secondary | ICD-10-CM | POA: Diagnosis not present

## 2023-02-23 DIAGNOSIS — M47816 Spondylosis without myelopathy or radiculopathy, lumbar region: Secondary | ICD-10-CM | POA: Diagnosis not present

## 2023-02-23 DIAGNOSIS — M5416 Radiculopathy, lumbar region: Secondary | ICD-10-CM | POA: Diagnosis not present

## 2023-02-28 DIAGNOSIS — M4316 Spondylolisthesis, lumbar region: Secondary | ICD-10-CM | POA: Diagnosis not present

## 2023-02-28 DIAGNOSIS — M791 Myalgia, unspecified site: Secondary | ICD-10-CM | POA: Diagnosis not present

## 2023-02-28 DIAGNOSIS — M5416 Radiculopathy, lumbar region: Secondary | ICD-10-CM | POA: Diagnosis not present

## 2023-02-28 DIAGNOSIS — Z6825 Body mass index (BMI) 25.0-25.9, adult: Secondary | ICD-10-CM | POA: Diagnosis not present

## 2023-04-05 DIAGNOSIS — R351 Nocturia: Secondary | ICD-10-CM | POA: Diagnosis not present

## 2023-04-05 DIAGNOSIS — R3915 Urgency of urination: Secondary | ICD-10-CM | POA: Diagnosis not present

## 2023-04-05 DIAGNOSIS — N3281 Overactive bladder: Secondary | ICD-10-CM | POA: Diagnosis not present

## 2023-04-05 DIAGNOSIS — N401 Enlarged prostate with lower urinary tract symptoms: Secondary | ICD-10-CM | POA: Diagnosis not present

## 2023-04-13 DIAGNOSIS — H53462 Homonymous bilateral field defects, left side: Secondary | ICD-10-CM | POA: Diagnosis not present

## 2023-04-13 DIAGNOSIS — H401112 Primary open-angle glaucoma, right eye, moderate stage: Secondary | ICD-10-CM | POA: Diagnosis not present

## 2023-04-13 DIAGNOSIS — H401123 Primary open-angle glaucoma, left eye, severe stage: Secondary | ICD-10-CM | POA: Diagnosis not present

## 2023-04-24 ENCOUNTER — Other Ambulatory Visit: Payer: Self-pay

## 2023-04-24 ENCOUNTER — Emergency Department (HOSPITAL_BASED_OUTPATIENT_CLINIC_OR_DEPARTMENT_OTHER)
Admission: EM | Admit: 2023-04-24 | Discharge: 2023-04-24 | Disposition: A | Discharge status: Home / Self Care | Payer: Medicare PPO | Attending: Emergency Medicine | Admitting: Emergency Medicine

## 2023-04-24 ENCOUNTER — Encounter (HOSPITAL_BASED_OUTPATIENT_CLINIC_OR_DEPARTMENT_OTHER): Payer: Self-pay

## 2023-04-24 ENCOUNTER — Other Ambulatory Visit (HOSPITAL_BASED_OUTPATIENT_CLINIC_OR_DEPARTMENT_OTHER): Payer: Self-pay

## 2023-04-24 DIAGNOSIS — I10 Essential (primary) hypertension: Secondary | ICD-10-CM | POA: Insufficient documentation

## 2023-04-24 DIAGNOSIS — N390 Urinary tract infection, site not specified: Secondary | ICD-10-CM

## 2023-04-24 DIAGNOSIS — E876 Hypokalemia: Secondary | ICD-10-CM | POA: Insufficient documentation

## 2023-04-24 DIAGNOSIS — R509 Fever, unspecified: Secondary | ICD-10-CM | POA: Diagnosis present

## 2023-04-24 LAB — COMPREHENSIVE METABOLIC PANEL
ALT: 11 U/L (ref 0–44)
AST: 18 U/L (ref 15–41)
Albumin: 4.1 g/dL (ref 3.5–5.0)
Alkaline Phosphatase: 40 U/L (ref 38–126)
Anion gap: 9 (ref 5–15)
BUN: 21 mg/dL (ref 8–23)
CO2: 26 mmol/L (ref 22–32)
Calcium: 9.1 mg/dL (ref 8.9–10.3)
Chloride: 101 mmol/L (ref 98–111)
Creatinine, Ser: 1.09 mg/dL (ref 0.61–1.24)
GFR, Estimated: >60 mL/min (ref >60)
Glucose, Bld: 104 mg/dL — ABNORMAL HIGH (ref 70–99)
Potassium: 3.3 mmol/L — ABNORMAL LOW (ref 3.5–5.1)
Sodium: 136 mmol/L (ref 135–145)
Total Bilirubin: 1 mg/dL (ref 0.3–1.2)
Total Protein: 6.3 g/dL — ABNORMAL LOW (ref 6.5–8.1)

## 2023-04-24 LAB — CBC WITH DIFFERENTIAL/PLATELET
Abs Immature Granulocytes: 0.05 10*3/uL (ref 0.00–0.07)
Basophils Absolute: 0 10*3/uL (ref 0.0–0.1)
Basophils Relative: 0 %
Eosinophils Absolute: 0 10*3/uL (ref 0.0–0.5)
Eosinophils Relative: 0 %
HCT: 38.8 % — ABNORMAL LOW (ref 39.0–52.0)
Hemoglobin: 13.5 g/dL (ref 13.0–17.0)
Immature Granulocytes: 1 %
Lymphocytes Relative: 4 %
Lymphs Abs: 0.4 10*3/uL — ABNORMAL LOW (ref 0.7–4.0)
MCH: 31 pg (ref 26.0–34.0)
MCHC: 34.8 g/dL (ref 30.0–36.0)
MCV: 89.2 fL (ref 80.0–100.0)
Monocytes Absolute: 0.2 10*3/uL (ref 0.1–1.0)
Monocytes Relative: 2 %
Neutro Abs: 9.4 10*3/uL — ABNORMAL HIGH (ref 1.7–7.7)
Neutrophils Relative %: 93 %
Platelets: 116 10*3/uL — ABNORMAL LOW (ref 150–400)
RBC: 4.35 MIL/uL (ref 4.22–5.81)
RDW: 13.2 % (ref 11.5–15.5)
WBC: 10 10*3/uL (ref 4.0–10.5)
nRBC: 0 % (ref 0.0–0.2)

## 2023-04-24 LAB — URINALYSIS, W/ REFLEX TO CULTURE (INFECTION SUSPECTED)
Bilirubin Urine: NEGATIVE
Glucose, UA: NEGATIVE mg/dL
Ketones, ur: 40 mg/dL — AB
Nitrite: POSITIVE — AB
Specific Gravity, Urine: 1.018 (ref 1.005–1.030)
WBC, UA: >50 WBC/hpf (ref 0–5)
pH: 6 (ref 5.0–8.0)

## 2023-04-24 LAB — LACTIC ACID, PLASMA: Lactic Acid, Venous: 1.3 mmol/L (ref 0.5–1.9)

## 2023-04-24 MED ORDER — SODIUM CHLORIDE 0.9 % IV SOLN
1.0000 g | Freq: Once | INTRAVENOUS | Status: AC
  Administered 2023-04-24: 1 g via INTRAVENOUS
  Filled 2023-04-24: qty 10

## 2023-04-24 MED ORDER — SODIUM CHLORIDE 0.9 % IV BOLUS
1000.0000 mL | Freq: Once | INTRAVENOUS | Status: AC
  Administered 2023-04-24: 1000 mL via INTRAVENOUS

## 2023-04-24 MED ORDER — CEPHALEXIN 500 MG PO CAPS
500.0000 mg | ORAL_CAPSULE | Freq: Four times a day (QID) | ORAL | 0 refills | Status: AC
  Filled 2023-04-24: qty 40, 10d supply, fill #0

## 2023-04-24 NOTE — Discharge Instructions (Signed)
Your urine shows infection.  A urine culture has been sent and you will be notified if the antibiotic does not cover it.  Follow-up with your doctor.  Return for worsening symptoms.

## 2023-04-24 NOTE — Telephone Encounter (Signed)
Erroneous encounter will close.
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
## 2023-04-24 NOTE — ED Notes (Signed)
Pt given discharge instructions and reviewed prescriptions. Opportunities given for questions. Pt verbalizes understanding. PIV removed x1. Maisen Schmit R, RN 

## 2023-04-24 NOTE — ED Triage Notes (Signed)
Patient here POV from Home.  Endorses waking up this AM with Fever, Chills, Aches, Nausea, Fatigue. Temp of 103. Tylenol PTA.   NAD Noted during Triage. A&Ox4. Gcs 15. Ambulatory.

## 2023-04-26 LAB — URINE CULTURE: Culture: >=100000 — AB

## 2023-04-27 ENCOUNTER — Telehealth (HOSPITAL_BASED_OUTPATIENT_CLINIC_OR_DEPARTMENT_OTHER): Payer: Self-pay | Admitting: Emergency Medicine

## 2023-04-27 NOTE — Telephone Encounter (Signed)
Post ED Visit - Positive Culture Follow-up  Culture report reviewed by antimicrobial stewardship pharmacist: [facility] []  Enzo Bi, Pharm.D. []  Celedonio Miyamoto, Pharm.D., BCPS AQ-ID []  Garvin Fila, Pharm.D., BCPS []  Georgina Pillion, [address].D., BCPS []  [place], [place].D., BCPS, AAHIVP []  Estella Husk, Pharm.D., BCPS, AAHIVP []  Lysle Pearl, PharmD, BCPS []  Phillips Climes, PharmD, BCPS []  Agapito Games, PharmD, BCPS [x]  Elaina Hoops, PharmD []  Mervyn Gay, PharmD, BCPS []  Vinnie Level, PharmD  [facility] []  Len Childs, PharmD []  Greer Pickerel, PharmD []  Adalberto Cole, PharmD []  Perlie Gold, Rph []  Lonell Face) Jean Rosenthal, PharmD []  Earl Many, PharmD []  Junita Push, PharmD []  Dorna Leitz, PharmD []  Terrilee Files, PharmD []  Lynann Beaver, PharmD []  Keturah Barre, PharmD []  Loralee Pacas, PharmD []  Bernadene Person, PharmD   Positive urine culture Treated with Cephalexin, organism sensitive to the same and no further patient follow-up is required at this time.  Tanna Savoy Bindu Docter 04/27/2023, 4:14 PM

## 2023-04-29 LAB — CULTURE, BLOOD (ROUTINE X 2)
Culture: NO GROWTH
Special Requests: ADEQUATE

## 2023-05-12 ENCOUNTER — Ambulatory Visit: Payer: Medicare PPO | Admitting: Family Medicine

## 2023-05-12 ENCOUNTER — Encounter: Payer: Self-pay | Admitting: Family Medicine

## 2023-05-12 VITALS — BP 128/67 | HR 74 | Temp 98.7°F | Ht 71.0 in | Wt 172.0 lb

## 2023-05-12 DIAGNOSIS — D696 Thrombocytopenia, unspecified: Secondary | ICD-10-CM | POA: Diagnosis not present

## 2023-05-12 DIAGNOSIS — N3000 Acute cystitis without hematuria: Secondary | ICD-10-CM | POA: Diagnosis not present

## 2023-05-12 DIAGNOSIS — I10 Essential (primary) hypertension: Secondary | ICD-10-CM

## 2023-05-12 DIAGNOSIS — L570 Actinic keratosis: Secondary | ICD-10-CM | POA: Diagnosis not present

## 2023-05-12 DIAGNOSIS — Z87440 Personal history of urinary (tract) infections: Secondary | ICD-10-CM

## 2023-05-12 DIAGNOSIS — E876 Hypokalemia: Secondary | ICD-10-CM

## 2023-05-12 DIAGNOSIS — I700 Atherosclerosis of aorta: Secondary | ICD-10-CM

## 2023-05-12 LAB — URINALYSIS, ROUTINE W REFLEX MICROSCOPIC
Bilirubin, UA: NEGATIVE
Glucose, UA: NEGATIVE
Ketones, UA: NEGATIVE
Nitrite, UA: NEGATIVE
Protein,UA: NEGATIVE
Specific Gravity, UA: 1.015 (ref 1.005–1.030)
Urobilinogen, Ur: 0.2 mg/dL (ref 0.2–1.0)
pH, UA: 7 (ref 5.0–7.5)

## 2023-05-12 LAB — MICROSCOPIC EXAMINATION: Renal Epithel, UA: NONE SEEN /hpf

## 2023-05-12 MED ORDER — TADALAFIL 10 MG PO TABS: 10.0000 mg | ORAL_TABLET | Freq: Every day | ORAL | 12 refills | Status: DC | PRN

## 2023-05-12 MED ORDER — MELOXICAM 15 MG PO TABS: ORAL_TABLET | ORAL | 99 refills | Status: DC

## 2023-05-12 MED ORDER — ROSUVASTATIN CALCIUM 10 MG PO TABS: ORAL_TABLET | ORAL | 3 refills | Status: DC
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
## 2023-05-13 LAB — BASIC METABOLIC PANEL
BUN/Creatinine Ratio: 17 (ref 10–24)
BUN: 18 mg/dL (ref 8–27)
CO2: 27 mmol/L (ref 20–29)
Calcium: 9.3 mg/dL (ref 8.6–10.2)
Chloride: 102 mmol/L (ref 96–106)
Creatinine, Ser: 1.03 mg/dL (ref 0.76–1.27)
Glucose: 89 mg/dL (ref 70–99)
Potassium: 4.6 mmol/L (ref 3.5–5.2)
Sodium: 139 mmol/L (ref 134–144)
eGFR: 72 mL/min/{1.73_m2} (ref >59)

## 2023-05-13 LAB — LIPID PANEL
Chol/HDL Ratio: 2.2 ratio (ref 0.0–5.0)
Cholesterol, Total: 143 mg/dL (ref 100–199)
HDL: 65 mg/dL (ref >39)
LDL Chol Calc (NIH): 65 mg/dL (ref 0–99)
Triglycerides: 64 mg/dL (ref 0–149)
VLDL Cholesterol Cal: 13 mg/dL (ref 5–40)

## 2023-05-16 ENCOUNTER — Telehealth: Payer: Self-pay

## 2023-05-16 ENCOUNTER — Other Ambulatory Visit: Payer: Self-pay | Admitting: Family Medicine

## 2023-05-16 DIAGNOSIS — N3000 Acute cystitis without hematuria: Secondary | ICD-10-CM

## 2023-05-16 MED ORDER — CEPHALEXIN 500 MG PO CAPS: 500.0000 mg | ORAL_CAPSULE | Freq: Four times a day (QID) | ORAL | 0 refills | Status: AC

## 2023-05-16 NOTE — Telephone Encounter (Signed)
Called pt LVM regarding Dr. Delynn Flavin msg"Keflex sent " per pt's DPR.

## 2023-05-16 NOTE — Telephone Encounter (Signed)
Pt called in stating that pt seen you on Friday and stated that he is still having a low-grade fever, chills and continuously going to the bathroom, however he does not have any burning. Per pt would like to know if you have the culture results and advice what to do? Ie meds if any?

## 2023-05-16 NOTE — Telephone Encounter (Signed)
Keflex sent but his urine culture sensitivities are still pending

## 2023-05-17 LAB — URINE CULTURE

## 2023-05-22 ENCOUNTER — Telehealth: Payer: Self-pay | Admitting: Family Medicine

## 2023-05-22 DIAGNOSIS — N3000 Acute cystitis without hematuria: Secondary | ICD-10-CM

## 2023-05-22 NOTE — Telephone Encounter (Signed)
Ok to order UA/ UCx

## 2023-05-22 NOTE — Telephone Encounter (Signed)
Orders placed and lab appointment scheduled for tomorrow, patient aware.

## 2023-05-23 ENCOUNTER — Other Ambulatory Visit: Payer: Medicare PPO

## 2023-05-23 DIAGNOSIS — N3000 Acute cystitis without hematuria: Secondary | ICD-10-CM

## 2023-05-23 LAB — URINALYSIS, COMPLETE
Bilirubin, UA: NEGATIVE
Glucose, UA: NEGATIVE
Ketones, UA: NEGATIVE
Nitrite, UA: NEGATIVE
Protein,UA: NEGATIVE
Specific Gravity, UA: 1.01 (ref 1.005–1.030)
Urobilinogen, Ur: 0.2 mg/dL (ref 0.2–1.0)
pH, UA: 6 (ref 5.0–7.5)

## 2023-05-23 LAB — MICROSCOPIC EXAMINATION
Bacteria, UA: NONE SEEN
Epithelial Cells (non renal): NONE SEEN /hpf (ref 0–10)
RBC, Urine: NONE SEEN /hpf (ref 0–2)
Renal Epithel, UA: NONE SEEN /hpf

## 2023-05-25 LAB — URINE CULTURE: Organism ID, Bacteria: NO GROWTH

## 2023-07-06 DIAGNOSIS — H401112 Primary open-angle glaucoma, right eye, moderate stage: Secondary | ICD-10-CM | POA: Diagnosis not present

## 2023-07-06 DIAGNOSIS — N3281 Overactive bladder: Secondary | ICD-10-CM | POA: Diagnosis not present

## 2023-07-06 DIAGNOSIS — R3915 Urgency of urination: Secondary | ICD-10-CM | POA: Diagnosis not present

## 2023-07-06 DIAGNOSIS — R350 Frequency of micturition: Secondary | ICD-10-CM | POA: Diagnosis not present

## 2023-07-06 DIAGNOSIS — H401123 Primary open-angle glaucoma, left eye, severe stage: Secondary | ICD-10-CM | POA: Diagnosis not present

## 2023-07-06 DIAGNOSIS — N401 Enlarged prostate with lower urinary tract symptoms: Secondary | ICD-10-CM | POA: Diagnosis not present

## 2023-07-06 DIAGNOSIS — N312 Flaccid neuropathic bladder, not elsewhere classified: Secondary | ICD-10-CM | POA: Diagnosis not present

## 2023-07-06 DIAGNOSIS — N138 Other obstructive and reflux uropathy: Secondary | ICD-10-CM | POA: Diagnosis not present

## 2023-07-06 DIAGNOSIS — N3941 Urge incontinence: Secondary | ICD-10-CM | POA: Diagnosis not present

## 2023-07-18 DIAGNOSIS — H903 Sensorineural hearing loss, bilateral: Secondary | ICD-10-CM | POA: Diagnosis not present

## 2023-07-18 DIAGNOSIS — H9113 Presbycusis, bilateral: Secondary | ICD-10-CM | POA: Diagnosis not present

## 2023-07-28 ENCOUNTER — Other Ambulatory Visit: Payer: Self-pay | Admitting: Internal Medicine

## 2023-07-28 ENCOUNTER — Other Ambulatory Visit: Payer: Self-pay

## 2023-07-28 MED ORDER — AMLODIPINE BESYLATE 5 MG PO TABS: 5.0000 mg | ORAL_TABLET | Freq: Every day | ORAL | 1 refills | Status: DC

## 2023-08-10 DIAGNOSIS — Z85820 Personal history of malignant melanoma of skin: Secondary | ICD-10-CM | POA: Diagnosis not present

## 2023-08-10 DIAGNOSIS — N3281 Overactive bladder: Secondary | ICD-10-CM | POA: Diagnosis not present

## 2023-08-10 DIAGNOSIS — L570 Actinic keratosis: Secondary | ICD-10-CM | POA: Diagnosis not present

## 2023-08-10 DIAGNOSIS — L820 Inflamed seborrheic keratosis: Secondary | ICD-10-CM | POA: Diagnosis not present

## 2023-08-10 DIAGNOSIS — N3001 Acute cystitis with hematuria: Secondary | ICD-10-CM | POA: Diagnosis not present

## 2023-08-10 DIAGNOSIS — D229 Melanocytic nevi, unspecified: Secondary | ICD-10-CM | POA: Diagnosis not present

## 2023-08-10 DIAGNOSIS — L578 Other skin changes due to chronic exposure to nonionizing radiation: Secondary | ICD-10-CM | POA: Diagnosis not present

## 2023-08-17 ENCOUNTER — Encounter: Payer: Self-pay | Admitting: Internal Medicine

## 2023-08-17 ENCOUNTER — Ambulatory Visit: Payer: Medicare PPO | Attending: Internal Medicine | Admitting: Internal Medicine

## 2023-08-17 VITALS — BP 142/64 | HR 51 | Ht 70.0 in | Wt 175.8 lb

## 2023-08-17 DIAGNOSIS — I493 Ventricular premature depolarization: Secondary | ICD-10-CM

## 2023-08-17 DIAGNOSIS — I351 Nonrheumatic aortic (valve) insufficiency: Secondary | ICD-10-CM

## 2023-08-17 NOTE — Patient Instructions (Signed)
Medication Instructions:  Your physician recommends that you continue on your current medications as directed. Please refer to the Current Medication list given to you today.  *If you need a refill on your cardiac medications before your next appointment, please call your pharmacy*  Lab Work: None ordered.  If you have labs (blood work) drawn today and your tests are completely normal, you will receive your results only by: MyChart Message (if you have MyChart) OR A paper copy in the mail If you have any lab test that is abnormal or we need to change your treatment, we will call you to review the results.  Testing/Procedures: Echocardiogram  Your physician has requested that you have an echocardiogram. Echocardiography is a painless test that uses sound waves to create images of your heart. It provides your doctor with information about the size and shape of your heart and how well your heart's chambers and valves are working. This procedure takes approximately one hour. There are no restrictions for this procedure. Please do NOT wear cologne, perfume, aftershave, or lotions (deodorant is allowed). Please arrive 15 minutes prior to your appointment time.    Follow-Up: At [facility], you and your health needs are our priority.  As part of our continuing mission to provide you with exceptional heart care, we have created designated Provider Care Teams.  These Care Teams include your primary Cardiologist (physician) and Advanced Practice Providers (APPs -  Physician Assistants and Nurse Practitioners) who all work together to provide you with the care you need, when you need it.  We recommend signing up for the patient portal called "MyChart".  Sign up information is provided on this After Visit Summary.  MyChart is used to connect with patients for Virtual Visits (Telemedicine).  Patients are able to view lab/test results, encounter notes, upcoming appointments, etc.  Non-urgent messages  can be sent to your provider as well.   To learn more about what you can do with MyChart, go to [email or web address].    Your next appointment:   To be determined  The format for your next appointment:   In Person  Provider:   Lewayne Bunting, MD{or one of the following Advanced Practice Providers on your designated Care Team:   Francis Dowse, [place] Casimiro Needle "Mardelle Matte" [place], [place] Earnest Rosier, NP   Important Information About Sugar
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
## 2023-08-31 ENCOUNTER — Ambulatory Visit (HOSPITAL_COMMUNITY): Payer: Medicare PPO | Attending: Internal Medicine

## 2023-08-31 DIAGNOSIS — I351 Nonrheumatic aortic (valve) insufficiency: Secondary | ICD-10-CM | POA: Insufficient documentation

## 2023-08-31 LAB — ECHOCARDIOGRAM COMPLETE
AR max vel: 3.29 cm2
AV Area VTI: 3.33 cm2
AV Area mean vel: 3.11 cm2
AV Mean grad: 8 mmHg
AV Peak grad: 15.4 mmHg
Ao pk vel: 1.96 m/s
Area-P 1/2: 2.19 cm2
P 1/2 time: 579 msec
S' Lateral: 3.1 cm

## 2023-09-07 ENCOUNTER — Telehealth: Payer: Self-pay | Admitting: Internal Medicine

## 2023-09-07 NOTE — Telephone Encounter (Signed)
Pt is requesting a callback regarding ECHO results. Please advise

## 2023-09-08 NOTE — Telephone Encounter (Signed)
Paul Reichert, MD  You14 hours ago (6:57 PM)   Please let patient know that heart function is normal but he does have some calcification of the aortic valve and the aortic valve has gone from being mildly leaky to severely leaky and further workup is necessary.  Would you please get him in to see Dr. Ladona Ridgel ASAP to review this and make further recommendations    Called patient with results. Will send message to Dr. Lubertha Basque scheduler to see if patient can get in with him or Otilio Saber PA in the next week.

## 2023-09-14 NOTE — Telephone Encounter (Signed)
That day for followup is ok with me. GT

## 2023-09-26 ENCOUNTER — Encounter: Payer: Self-pay | Admitting: Internal Medicine

## 2023-09-26 ENCOUNTER — Ambulatory Visit: Payer: Medicare PPO | Attending: Internal Medicine | Admitting: Internal Medicine

## 2023-09-26 VITALS — BP 130/58 | HR 51 | Ht 70.0 in | Wt 176.2 lb

## 2023-09-26 DIAGNOSIS — I351 Nonrheumatic aortic (valve) insufficiency: Secondary | ICD-10-CM | POA: Diagnosis not present
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
## 2023-09-26 NOTE — Patient Instructions (Signed)

## 2023-10-03 ENCOUNTER — Other Ambulatory Visit: Payer: Self-pay | Admitting: Internal Medicine

## 2023-10-06 DIAGNOSIS — N529 Male erectile dysfunction, unspecified: Secondary | ICD-10-CM | POA: Diagnosis not present

## 2023-10-06 DIAGNOSIS — N312 Flaccid neuropathic bladder, not elsewhere classified: Secondary | ICD-10-CM | POA: Diagnosis not present

## 2023-10-06 DIAGNOSIS — N401 Enlarged prostate with lower urinary tract symptoms: Secondary | ICD-10-CM | POA: Diagnosis not present

## 2023-10-06 DIAGNOSIS — N3001 Acute cystitis with hematuria: Secondary | ICD-10-CM | POA: Diagnosis not present

## 2023-10-06 DIAGNOSIS — N138 Other obstructive and reflux uropathy: Secondary | ICD-10-CM | POA: Diagnosis not present

## 2023-10-06 DIAGNOSIS — N3281 Overactive bladder: Secondary | ICD-10-CM | POA: Diagnosis not present

## 2023-10-12 DIAGNOSIS — H53462 Homonymous bilateral field defects, left side: Secondary | ICD-10-CM | POA: Diagnosis not present

## 2023-10-12 DIAGNOSIS — M5416 Radiculopathy, lumbar region: Secondary | ICD-10-CM | POA: Diagnosis not present

## 2023-10-12 DIAGNOSIS — Z6825 Body mass index (BMI) 25.0-25.9, adult: Secondary | ICD-10-CM | POA: Diagnosis not present

## 2023-10-12 DIAGNOSIS — H401112 Primary open-angle glaucoma, right eye, moderate stage: Secondary | ICD-10-CM | POA: Diagnosis not present

## 2023-10-12 DIAGNOSIS — H401123 Primary open-angle glaucoma, left eye, severe stage: Secondary | ICD-10-CM | POA: Diagnosis not present

## 2023-10-12 DIAGNOSIS — M7062 Trochanteric bursitis, left hip: Secondary | ICD-10-CM | POA: Diagnosis not present

## 2023-10-23 ENCOUNTER — Ambulatory Visit (INDEPENDENT_AMBULATORY_CARE_PROVIDER_SITE_OTHER): Payer: Medicare PPO

## 2023-10-23 DIAGNOSIS — Z23 Encounter for immunization: Secondary | ICD-10-CM

## 2023-10-24 ENCOUNTER — Other Ambulatory Visit: Payer: Self-pay

## 2023-10-24 ENCOUNTER — Ambulatory Visit: Payer: Medicare PPO | Attending: Neurological Surgery | Admitting: Physical Therapy

## 2023-10-24 ENCOUNTER — Encounter: Payer: Self-pay | Admitting: Physical Therapy

## 2023-10-24 DIAGNOSIS — M5459 Other low back pain: Secondary | ICD-10-CM | POA: Insufficient documentation

## 2023-10-24 DIAGNOSIS — R293 Abnormal posture: Secondary | ICD-10-CM | POA: Diagnosis not present

## 2023-10-24 DIAGNOSIS — M62838 Other muscle spasm: Secondary | ICD-10-CM | POA: Insufficient documentation
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
## 2023-10-26 ENCOUNTER — Ambulatory Visit: Payer: Medicare PPO

## 2023-10-26 DIAGNOSIS — M62838 Other muscle spasm: Secondary | ICD-10-CM

## 2023-10-26 DIAGNOSIS — M5459 Other low back pain: Secondary | ICD-10-CM

## 2023-10-26 DIAGNOSIS — R293 Abnormal posture: Secondary | ICD-10-CM | POA: Diagnosis not present
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
## 2023-11-01 ENCOUNTER — Ambulatory Visit: Payer: Medicare PPO | Admitting: *Deleted

## 2023-11-01 ENCOUNTER — Encounter: Payer: Self-pay | Admitting: *Deleted

## 2023-11-01 DIAGNOSIS — M5459 Other low back pain: Secondary | ICD-10-CM | POA: Diagnosis not present

## 2023-11-01 DIAGNOSIS — M62838 Other muscle spasm: Secondary | ICD-10-CM | POA: Diagnosis not present

## 2023-11-01 DIAGNOSIS — R293 Abnormal posture: Secondary | ICD-10-CM | POA: Diagnosis not present
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
## 2023-11-07 ENCOUNTER — Ambulatory Visit: Payer: Medicare PPO | Attending: Neurological Surgery | Admitting: *Deleted

## 2023-11-07 DIAGNOSIS — R293 Abnormal posture: Secondary | ICD-10-CM | POA: Diagnosis not present

## 2023-11-07 DIAGNOSIS — M5459 Other low back pain: Secondary | ICD-10-CM | POA: Insufficient documentation

## 2023-11-07 DIAGNOSIS — M545 Low back pain, unspecified: Secondary | ICD-10-CM | POA: Diagnosis not present

## 2023-11-07 DIAGNOSIS — M62838 Other muscle spasm: Secondary | ICD-10-CM | POA: Insufficient documentation
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
## 2023-11-10 ENCOUNTER — Ambulatory Visit: Payer: Medicare PPO | Admitting: Physical Therapy

## 2023-11-10 ENCOUNTER — Encounter: Payer: Self-pay | Admitting: Physical Therapy

## 2023-11-10 DIAGNOSIS — R293 Abnormal posture: Secondary | ICD-10-CM | POA: Diagnosis not present

## 2023-11-10 DIAGNOSIS — M62838 Other muscle spasm: Secondary | ICD-10-CM | POA: Diagnosis not present

## 2023-11-10 DIAGNOSIS — M5459 Other low back pain: Secondary | ICD-10-CM | POA: Diagnosis not present

## 2023-11-10 DIAGNOSIS — M545 Low back pain, unspecified: Secondary | ICD-10-CM | POA: Diagnosis not present
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
## 2023-11-13 ENCOUNTER — Ambulatory Visit: Payer: Medicare PPO

## 2023-11-13 DIAGNOSIS — M5459 Other low back pain: Secondary | ICD-10-CM | POA: Diagnosis not present

## 2023-11-13 DIAGNOSIS — M62838 Other muscle spasm: Secondary | ICD-10-CM | POA: Diagnosis not present

## 2023-11-13 DIAGNOSIS — M545 Low back pain, unspecified: Secondary | ICD-10-CM

## 2023-11-13 DIAGNOSIS — R293 Abnormal posture: Secondary | ICD-10-CM
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
## 2023-11-16 ENCOUNTER — Ambulatory Visit: Payer: Medicare PPO | Admitting: *Deleted

## 2023-11-16 ENCOUNTER — Encounter: Payer: Self-pay | Admitting: *Deleted

## 2023-11-16 DIAGNOSIS — M62838 Other muscle spasm: Secondary | ICD-10-CM | POA: Diagnosis not present

## 2023-11-16 DIAGNOSIS — R293 Abnormal posture: Secondary | ICD-10-CM | POA: Diagnosis not present

## 2023-11-16 DIAGNOSIS — M545 Low back pain, unspecified: Secondary | ICD-10-CM

## 2023-11-16 DIAGNOSIS — M5459 Other low back pain: Secondary | ICD-10-CM
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
## 2023-11-23 DIAGNOSIS — H401123 Primary open-angle glaucoma, left eye, severe stage: Secondary | ICD-10-CM | POA: Diagnosis not present

## 2023-11-23 DIAGNOSIS — H53462 Homonymous bilateral field defects, left side: Secondary | ICD-10-CM | POA: Diagnosis not present

## 2023-11-23 DIAGNOSIS — H401112 Primary open-angle glaucoma, right eye, moderate stage: Secondary | ICD-10-CM | POA: Diagnosis not present

## 2023-12-15 ENCOUNTER — Encounter: Payer: Self-pay | Admitting: Family Medicine

## 2023-12-15 ENCOUNTER — Ambulatory Visit (INDEPENDENT_AMBULATORY_CARE_PROVIDER_SITE_OTHER): Payer: Medicare PPO | Admitting: Family Medicine

## 2023-12-15 VITALS — BP 122/64 | HR 55 | Temp 98.3°F | Ht 70.0 in | Wt 167.0 lb

## 2023-12-15 DIAGNOSIS — I351 Nonrheumatic aortic (valve) insufficiency: Secondary | ICD-10-CM | POA: Diagnosis not present

## 2023-12-15 DIAGNOSIS — D696 Thrombocytopenia, unspecified: Secondary | ICD-10-CM | POA: Diagnosis not present

## 2023-12-15 DIAGNOSIS — I700 Atherosclerosis of aorta: Secondary | ICD-10-CM

## 2023-12-15 DIAGNOSIS — E559 Vitamin D deficiency, unspecified: Secondary | ICD-10-CM

## 2023-12-15 DIAGNOSIS — R5383 Other fatigue: Secondary | ICD-10-CM | POA: Diagnosis not present

## 2023-12-15 DIAGNOSIS — E7800 Pure hypercholesterolemia, unspecified: Secondary | ICD-10-CM | POA: Diagnosis not present

## 2023-12-15 DIAGNOSIS — Z0000 Encounter for general adult medical examination without abnormal findings: Secondary | ICD-10-CM

## 2023-12-15 DIAGNOSIS — Z0001 Encounter for general adult medical examination with abnormal findings: Secondary | ICD-10-CM | POA: Diagnosis not present

## 2023-12-15 DIAGNOSIS — I10 Essential (primary) hypertension: Secondary | ICD-10-CM | POA: Diagnosis not present

## 2023-12-15 LAB — URINALYSIS, ROUTINE W REFLEX MICROSCOPIC
Bilirubin, UA: NEGATIVE
Glucose, UA: NEGATIVE
Ketones, UA: NEGATIVE
Leukocytes,UA: NEGATIVE
Nitrite, UA: NEGATIVE
Protein,UA: NEGATIVE
Specific Gravity, UA: 1.015 (ref 1.005–1.030)
Urobilinogen, Ur: 0.2 mg/dL (ref 0.2–1.0)
pH, UA: 7 (ref 5.0–7.5)

## 2023-12-15 LAB — LIPID PANEL

## 2023-12-15 LAB — MICROSCOPIC EXAMINATION
Bacteria, UA: NONE SEEN
Renal Epithel, UA: NONE SEEN /[HPF]
WBC, UA: NONE SEEN /[HPF] (ref 0–5)
Yeast, UA: NONE SEEN

## 2023-12-15 MED ORDER — TADALAFIL 10 MG PO TABS: 10.0000 mg | ORAL_TABLET | Freq: Every day | ORAL | 12 refills | Status: DC | PRN
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
## 2023-12-16 LAB — CMP14+EGFR
ALT: 13 IU/L (ref 0–44)
AST: 16 IU/L (ref 0–40)
Albumin: 4.5 g/dL (ref 3.7–4.7)
Alkaline Phosphatase: 52 IU/L (ref 44–121)
BUN/Creatinine Ratio: 12 (ref 10–24)
BUN: 13 mg/dL (ref 8–27)
Bilirubin Total: 0.5 mg/dL (ref 0.0–1.2)
CO2: 27 mmol/L (ref 20–29)
Calcium: 9.3 mg/dL (ref 8.6–10.2)
Chloride: 100 mmol/L (ref 96–106)
Creatinine, Ser: 1.13 mg/dL (ref 0.76–1.27)
Globulin, Total: 1.9 g/dL (ref 1.5–4.5)
Glucose: 93 mg/dL (ref 70–99)
Potassium: 4.4 mmol/L (ref 3.5–5.2)
Sodium: 138 mmol/L (ref 134–144)
Total Protein: 6.4 g/dL (ref 6.0–8.5)
eGFR: 64 mL/min/{1.73_m2} (ref >59)

## 2023-12-16 LAB — LIPID PANEL
Cholesterol, Total: 171 mg/dL (ref 100–199)
HDL: 76 mg/dL (ref >39)
LDL CALC COMMENT:: 2.3 ratio (ref 0.0–5.0)
LDL Chol Calc (NIH): 81 mg/dL (ref 0–99)
Triglycerides: 75 mg/dL (ref 0–149)
VLDL Cholesterol Cal: 14 mg/dL (ref 5–40)

## 2023-12-16 LAB — CBC
Hematocrit: 40.6 % (ref 37.5–51.0)
Hemoglobin: 13.2 g/dL (ref 13.0–17.7)
MCH: 30.8 pg (ref 26.6–33.0)
MCHC: 32.5 g/dL (ref 31.5–35.7)
MCV: 95 fL (ref 79–97)
Platelets: 168 10*3/uL (ref 150–450)
RBC: 4.28 x10E6/uL (ref 4.14–5.80)
RDW: 12.7 % (ref 11.6–15.4)
WBC: 5.9 10*3/uL (ref 3.4–10.8)

## 2023-12-16 LAB — VITAMIN D 25 HYDROXY (VIT D DEFICIENCY, FRACTURES): Vit D, 25-Hydroxy: 46.1 ng/mL (ref 30.0–100.0)

## 2023-12-16 LAB — TESTOSTERONE: Testosterone: 480 ng/dL (ref 264–916)

## 2023-12-16 LAB — VITAMIN B12: Vitamin B-12: 607 pg/mL (ref 232–1245)

## 2023-12-16 LAB — TSH: TSH: 2.58 u[IU]/mL (ref 0.450–4.500)

## 2023-12-19 LAB — IRON AND TIBC
Iron Saturation: 35 % (ref 15–55)
Iron: 89 ug/dL (ref 38–169)
Total Iron Binding Capacity: 255 ug/dL (ref 250–450)
UIBC: 166 ug/dL (ref 111–343)

## 2023-12-19 LAB — SPECIMEN STATUS REPORT

## 2024-01-09 DIAGNOSIS — H401122 Primary open-angle glaucoma, left eye, moderate stage: Secondary | ICD-10-CM | POA: Diagnosis not present

## 2024-01-09 DIAGNOSIS — H401113 Primary open-angle glaucoma, right eye, severe stage: Secondary | ICD-10-CM | POA: Diagnosis not present

## 2024-01-09 DIAGNOSIS — H53462 Homonymous bilateral field defects, left side: Secondary | ICD-10-CM | POA: Diagnosis not present

## 2024-01-31 ENCOUNTER — Ambulatory Visit: Payer: Medicare PPO

## 2024-01-31 VITALS — Ht 70.0 in | Wt 167.0 lb

## 2024-01-31 DIAGNOSIS — Z0000 Encounter for general adult medical examination without abnormal findings: Secondary | ICD-10-CM

## 2024-01-31 NOTE — Patient Instructions (Signed)
 [name] , Thank you for taking time to come for your Medicare Wellness Visit. I appreciate your ongoing commitment to your health goals. Please review the following plan we discussed and let me know if I can assist you in the future.   Referrals/Orders/Follow-Ups/Clinician Recommendations: Aim for 30 minutes of exercise or brisk walking, 6-8 glasses of water, and 5 servings of fruits and vegetables each day.  This is a list of the screening recommended for you and due dates:  Health Maintenance  Topic Date Due   COVID-19 Vaccine (4 - 2024-25 season) 08/06/2023   Zoster (Shingles) Vaccine (1 of 2) 03/14/2024*   Medicare Annual Wellness Visit  01/30/2025   Colon Cancer Screening  11/11/2029   DTaP/Tdap/Td vaccine (3 - Td or Tdap) 01/14/2030   Pneumonia Vaccine  Completed   Flu Shot  Completed   HPV Vaccine  Aged Out  *Topic was postponed. The date shown is not the original due date.    Advanced directives: (ACP Link)Information on Advanced Care Planning can be found at [facility] of [place] Advance Health Care Directives Advance Health Care Directives ([email or web address]   Next Medicare Annual Wellness Visit scheduled for next year: Yes
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
## 2024-04-05 DIAGNOSIS — N138 Other obstructive and reflux uropathy: Secondary | ICD-10-CM | POA: Diagnosis not present

## 2024-04-05 DIAGNOSIS — N529 Male erectile dysfunction, unspecified: Secondary | ICD-10-CM | POA: Diagnosis not present

## 2024-04-05 DIAGNOSIS — N3281 Overactive bladder: Secondary | ICD-10-CM | POA: Diagnosis not present

## 2024-04-05 DIAGNOSIS — N401 Enlarged prostate with lower urinary tract symptoms: Secondary | ICD-10-CM | POA: Diagnosis not present

## 2024-04-18 DIAGNOSIS — Z6825 Body mass index (BMI) 25.0-25.9, adult: Secondary | ICD-10-CM | POA: Diagnosis not present

## 2024-04-18 DIAGNOSIS — M7062 Trochanteric bursitis, left hip: Secondary | ICD-10-CM | POA: Diagnosis not present
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
## 2024-04-30 ENCOUNTER — Encounter: Payer: Self-pay | Admitting: Physician Assistant

## 2024-04-30 ENCOUNTER — Ambulatory Visit: Attending: Physician Assistant | Admitting: Physician Assistant

## 2024-04-30 VITALS — BP 110/60 | HR 55 | Ht 70.5 in | Wt 175.0 lb

## 2024-04-30 DIAGNOSIS — I495 Sick sinus syndrome: Secondary | ICD-10-CM | POA: Diagnosis not present

## 2024-04-30 DIAGNOSIS — R0609 Other forms of dyspnea: Secondary | ICD-10-CM

## 2024-04-30 DIAGNOSIS — I493 Ventricular premature depolarization: Secondary | ICD-10-CM | POA: Diagnosis not present

## 2024-04-30 DIAGNOSIS — I351 Nonrheumatic aortic (valve) insufficiency: Secondary | ICD-10-CM

## 2024-04-30 DIAGNOSIS — I10 Essential (primary) hypertension: Secondary | ICD-10-CM | POA: Diagnosis not present

## 2024-04-30 NOTE — Patient Instructions (Signed)
 Medication Instructions:   Your physician recommends that you continue on your current medications as directed. Please refer to the Current Medication list given to you today.  *If you need a refill on your cardiac medications before your next appointment, please call your pharmacy*   Lab Work:  NONE ORDERED  TODAY    If you have labs (blood work) drawn today and your tests are completely normal, you will receive your results only by: MyChart Message (if you have MyChart) OR A paper copy in the mail If you have any lab test that is abnormal or we need to change your treatment, we will call you to review the results.  Testing/Procedures: Your physician has requested that you have an echocardiogram. Echocardiography is a painless test that uses sound waves to create images of your heart. It provides your doctor with information about the size and shape of your heart and how well your heart's chambers and valves are working. This procedure takes approximately one hour. There are no restrictions for this procedure. Please do NOT wear cologne, perfume, aftershave, or lotions (deodorant is allowed). Please arrive 15 minutes prior to your appointment time.  Please note: We ask at that you not bring children with you during ultrasound (echo/ vascular) testing. Due to room size and safety concerns, children are not allowed in the ultrasound rooms during exams. Our front office staff cannot provide observation of children in our lobby area while testing is being conducted. An adult accompanying a patient to their appointment will only be allowed in the ultrasound room at the discretion of the ultrasound technician under special circumstances. We apologize for any inconvenience.    Follow-Up: At [facility], you and your health needs are our priority.  As part of our continuing mission to provide you with exceptional heart care, our providers are all part of one team.  This team includes your  primary Cardiologist (physician) and Advanced Practice Providers or APPs (Physician Assistants and Nurse Practitioners) who all work together to provide you with the care you need, when you need it.  Your next appointment:    6-8 week(s) ( CONTACT  CASSIE HALL/ ANGELINE HAMMER FOR EP SCHEDULING ISSUES )   Provider:   Mertha Abrahams, PA-C     We recommend signing up for the patient portal called "MyChart".  Sign up information is provided on this After Visit Summary.  MyChart is used to connect with patients for Virtual Visits (Telemedicine).  Patients are able to view lab/test results, encounter notes, upcoming appointments, etc.  Non-urgent messages can be sent to your provider as well.   To learn more about what you can do with MyChart, go to [email or web address].   Other Instructions

## 2024-05-08 DIAGNOSIS — H401113 Primary open-angle glaucoma, right eye, severe stage: Secondary | ICD-10-CM | POA: Diagnosis not present

## 2024-05-08 DIAGNOSIS — H401122 Primary open-angle glaucoma, left eye, moderate stage: Secondary | ICD-10-CM | POA: Diagnosis not present

## 2024-05-08 DIAGNOSIS — H53462 Homonymous bilateral field defects, left side: Secondary | ICD-10-CM | POA: Diagnosis not present

## 2024-05-08 DIAGNOSIS — Z961 Presence of intraocular lens: Secondary | ICD-10-CM | POA: Diagnosis not present

## 2024-05-13 DIAGNOSIS — N3281 Overactive bladder: Secondary | ICD-10-CM | POA: Diagnosis not present

## 2024-05-13 DIAGNOSIS — N401 Enlarged prostate with lower urinary tract symptoms: Secondary | ICD-10-CM | POA: Diagnosis not present

## 2024-05-13 DIAGNOSIS — N138 Other obstructive and reflux uropathy: Secondary | ICD-10-CM | POA: Diagnosis not present

## 2024-05-13 DIAGNOSIS — N529 Male erectile dysfunction, unspecified: Secondary | ICD-10-CM | POA: Diagnosis not present

## 2024-05-13 DIAGNOSIS — N312 Flaccid neuropathic bladder, not elsewhere classified: Secondary | ICD-10-CM | POA: Diagnosis not present

## 2024-05-13 DIAGNOSIS — N3941 Urge incontinence: Secondary | ICD-10-CM | POA: Diagnosis not present

## 2024-06-12 ENCOUNTER — Ambulatory Visit (HOSPITAL_COMMUNITY)
Admission: RE | Admit: 2024-06-12 | Discharge: 2024-06-12 | Disposition: A | Discharge status: Home / Self Care | Source: Ambulatory Visit | Attending: Cardiovascular Disease | Admitting: Cardiovascular Disease

## 2024-06-12 DIAGNOSIS — I351 Nonrheumatic aortic (valve) insufficiency: Secondary | ICD-10-CM | POA: Insufficient documentation

## 2024-06-12 LAB — ECHOCARDIOGRAM COMPLETE
Area-P 1/2: 2.14 cm2
P 1/2 time: 530 ms
S' Lateral: 3 cm

## 2024-06-13 ENCOUNTER — Ambulatory Visit: Payer: Self-pay | Admitting: Physician Assistant

## 2024-06-13 ENCOUNTER — Telehealth: Payer: Self-pay | Admitting: Internal Medicine

## 2024-06-13 NOTE — Telephone Encounter (Signed)
 Patient states he had an echo 7/9. Also states that Dr. Waddell mentioned at this visit last year that he would refer him to Dr. Wonda, but hasn't heard anything about it since then. Would like to follow up on it.

## 2024-06-14 ENCOUNTER — Ambulatory Visit: Attending: Physician Assistant | Admitting: Physician Assistant

## 2024-06-14 ENCOUNTER — Ambulatory Visit

## 2024-06-14 ENCOUNTER — Ambulatory Visit: Payer: Medicare PPO | Admitting: Family Medicine

## 2024-06-14 ENCOUNTER — Encounter: Payer: Self-pay | Admitting: Physician Assistant

## 2024-06-14 ENCOUNTER — Telehealth: Payer: Self-pay | Admitting: Family Medicine

## 2024-06-14 ENCOUNTER — Encounter: Payer: Self-pay | Admitting: Family Medicine

## 2024-06-14 VITALS — BP 126/54 | HR 50 | Temp 98.0°F | Ht 70.5 in | Wt 174.4 lb

## 2024-06-14 VITALS — BP 126/60 | HR 57 | Ht 70.0 in | Wt 177.6 lb

## 2024-06-14 DIAGNOSIS — Z0000 Encounter for general adult medical examination without abnormal findings: Secondary | ICD-10-CM

## 2024-06-14 DIAGNOSIS — I493 Ventricular premature depolarization: Secondary | ICD-10-CM | POA: Diagnosis not present

## 2024-06-14 DIAGNOSIS — M25552 Pain in left hip: Secondary | ICD-10-CM

## 2024-06-14 DIAGNOSIS — R001 Bradycardia, unspecified: Secondary | ICD-10-CM | POA: Diagnosis not present

## 2024-06-14 DIAGNOSIS — I351 Nonrheumatic aortic (valve) insufficiency: Secondary | ICD-10-CM

## 2024-06-14 DIAGNOSIS — S80861A Insect bite (nonvenomous), right lower leg, initial encounter: Secondary | ICD-10-CM

## 2024-06-14 DIAGNOSIS — L989 Disorder of the skin and subcutaneous tissue, unspecified: Secondary | ICD-10-CM

## 2024-06-14 DIAGNOSIS — R5383 Other fatigue: Secondary | ICD-10-CM

## 2024-06-14 DIAGNOSIS — W57XXXA Bitten or stung by nonvenomous insect and other nonvenomous arthropods, initial encounter: Secondary | ICD-10-CM | POA: Diagnosis not present

## 2024-06-14 DIAGNOSIS — Z23 Encounter for immunization: Secondary | ICD-10-CM

## 2024-06-14 DIAGNOSIS — I10 Essential (primary) hypertension: Secondary | ICD-10-CM

## 2024-06-14 DIAGNOSIS — E559 Vitamin D deficiency, unspecified: Secondary | ICD-10-CM

## 2024-06-14 MED ORDER — DOXYCYCLINE HYCLATE 100 MG PO TABS: ORAL_TABLET | ORAL | 0 refills | Status: DC

## 2024-06-14 NOTE — Addendum Note (Signed)
 Addended by: JOLINDA NORENE HERO on: 06/14/2024 09:50 AM   Modules accepted: Orders

## 2024-06-14 NOTE — Telephone Encounter (Signed)
 Please future lab orders for lab apt 12/25/2024. Pt is seeing Dr Jolinda for CPE at 2:00
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
## 2024-06-14 NOTE — Telephone Encounter (Signed)
 Future labs ordered.

## 2024-06-14 NOTE — Progress Notes (Unsigned)
 Enrolled patient for a 7 day Zio XT monitor to be mailed to patients home   Paul Parks to read

## 2024-06-14 NOTE — Patient Instructions (Signed)
 Medication Instructions:   Your physician recommends that you continue on your current medications as directed. Please refer to the Current Medication list given to you today.   *If you need a refill on your cardiac medications before your next appointment, please call your pharmacy*  Lab Work:  NONE ORDERED  TODAY    If you have labs (blood work) drawn today and your tests are completely normal, you will receive your results only by: MyChart Message (if you have MyChart) OR A paper copy in the mail If you have any lab test that is abnormal or we need to change your treatment, we will call you to review the results.  Testing/Procedures: Your physician has recommended that you wear an event monitor. Event monitors are medical devices that record the heart's electrical activity. Doctors most often us  these monitors to diagnose arrhythmias. Arrhythmias are problems with the speed or rhythm of the heartbeat. The monitor is a small, portable device. You can wear one while you do your normal daily activities. This is usually used to diagnose what is causing palpitations/syncope (passing out).    Follow-Up: At [facility], you and your health needs are our priority.  As part of our continuing mission to provide you with exceptional heart care, our providers are all part of one team.  This team includes your primary Cardiologist (physician) and Advanced Practice Providers or APPs (Physician Assistants and Nurse Practitioners) who all work together to provide you with the care you need, when you need it.    Your next appointment: NEXT  AVAILABLE WITH DR WONDA NEW PATIENT     We recommend signing up for the patient portal called MyChart.  Sign up information is provided on this After Visit Summary.  MyChart is used to connect with patients for Virtual Visits (Telemedicine).  Patients are able to view lab/test results, encounter notes, upcoming appointments, etc.  Non-urgent messages  can be sent to your provider as well.   To learn more about what you can do with MyChart, go to [email or web address].   Other Instructions  ZIO XT- Long Term Monitor Instructions  Your physician has requested you wear a ZIO patch monitor for 7 days.  This is a single patch monitor. [employer] supplies one patch monitor per enrollment. Additional stickers are not available. Please do not apply patch if you will be having a Nuclear Stress Test,  Echocardiogram, Cardiac CT, MRI, or Chest Xray during the period you would be wearing the  monitor. The patch cannot be worn during these tests. You cannot remove and re-apply the  ZIO XT patch monitor.  Your ZIO patch monitor will be mailed 3 day USPS to your address on file. It may take 3-5 days  to receive your monitor after you have been enrolled.  Once you have received your monitor, please review the enclosed instructions. Your monitor  has already been registered assigning a specific monitor serial # to you.  Billing and Patient Assistance Program Information  We have supplied [employer] with any of your insurance information on file for billing purposes. [employer] offers a sliding scale Patient Assistance Program for patients that do not have  insurance, or whose insurance does not completely cover the cost of the ZIO monitor.  You must apply for the Patient Assistance Program to qualify for this discounted rate.  To apply, please call [employer] at [phone], select option 4, select option 2, ask to apply for  Patient Assistance Program. Meredeth will ask your household income,  and how many people  are in your household. They will quote your out-of-pocket cost based on that information.  [employer] will also be able to set up a 35-month, interest-free payment plan if needed.  Applying the monitor   Shave hair from upper left chest.  Hold abrader disc by orange tab. Rub abrader in 40 strokes over the upper left chest as  indicated in your  monitor instructions.  Clean area with 4 enclosed alcohol pads. Let dry.  Apply patch as indicated in monitor instructions. Patch will be placed under collarbone on left  side of chest with arrow pointing upward.  Rub patch adhesive wings for 2 minutes. Remove white label marked 1. Remove the white  label marked 2. Rub patch adhesive wings for 2 additional minutes.  While looking in a mirror, press and release button in center of patch. A small green light will  flash 3-4 times. This will be your only indicator that the monitor has been turned on.  Do not shower for the first 24 hours. You may shower after the first 24 hours.  Press the button if you feel a symptom. You will hear a small click. Record Date, Time and  Symptom in the Patient Logbook.  When you are ready to remove the patch, follow instructions on the last 2 pages of Patient  Logbook. Stick patch monitor onto the last page of Patient Logbook.  Place Patient Logbook in the blue and white box. Use locking tab on box and tape box closed  securely. The blue and white box has prepaid postage on it. Please place it in the mailbox as  soon as possible. Your physician should have your test results approximately 7 days after the  monitor has been mailed back to [facility].  Call [facility] Customer Care at [phone] if you have questions regarding  your ZIO XT patch monitor. Call them immediately if you see an orange light blinking on your  monitor.  If your monitor falls off in less than 4 days, contact our Monitor department at [phone].  If your monitor becomes loose or falls off after 4 days call [employer] at [phone] for  suggestions on securing your monitor
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
## 2024-06-24 ENCOUNTER — Other Ambulatory Visit: Payer: Self-pay

## 2024-06-24 DIAGNOSIS — I495 Sick sinus syndrome: Secondary | ICD-10-CM

## 2024-06-24 DIAGNOSIS — I10 Essential (primary) hypertension: Secondary | ICD-10-CM

## 2024-06-24 DIAGNOSIS — I351 Nonrheumatic aortic (valve) insufficiency: Secondary | ICD-10-CM

## 2024-06-24 DIAGNOSIS — R001 Bradycardia, unspecified: Secondary | ICD-10-CM

## 2024-06-24 NOTE — Telephone Encounter (Signed)
 Referral for Dr Wonda was placed

## 2024-06-27 DIAGNOSIS — M7062 Trochanteric bursitis, left hip: Secondary | ICD-10-CM | POA: Diagnosis not present

## 2024-07-08 ENCOUNTER — Telehealth: Payer: Self-pay | Admitting: Physician Assistant

## 2024-07-08 DIAGNOSIS — I493 Ventricular premature depolarization: Secondary | ICD-10-CM | POA: Diagnosis not present

## 2024-07-08 NOTE — Telephone Encounter (Signed)
 Monitor alert for episode of Complete Heart Block on 06/25/24 at 4:05:27 rate 36-37 bpm lasted 8.2 sec.  Alert noted on page 11 strip 7.  Spoke with DOD Dr. Verlin advised if pt asymptomatic no changes at this time route to ordering provider.

## 2024-07-08 NOTE — Telephone Encounter (Signed)
 Calling with abn results

## 2024-07-14 DIAGNOSIS — I493 Ventricular premature depolarization: Secondary | ICD-10-CM | POA: Diagnosis not present

## 2024-07-15 ENCOUNTER — Ambulatory Visit: Payer: Self-pay | Admitting: Physician Assistant

## 2024-07-17 NOTE — Telephone Encounter (Signed)
-----   Message from Paul Parks sent at 07/15/2024  5:24 PM EDT ----- Monitor noted average HR 59 Slowest HRs occurred in the early morning/asleep hours. Findings overnight/early morning are perhaps suggestive of sleep apnea Low PVC burden  Recommend he get sleep study done ----- Message ----- From: Waddell Danelle ORN, MD Sent: 07/14/2024   8:08 PM EDT To: Paul Macario Arthur, PA-C

## 2024-07-29 ENCOUNTER — Telehealth: Payer: Self-pay | Admitting: Internal Medicine

## 2024-07-29 ENCOUNTER — Telehealth: Payer: Self-pay | Admitting: *Deleted

## 2024-07-29 DIAGNOSIS — R001 Bradycardia, unspecified: Secondary | ICD-10-CM

## 2024-07-29 NOTE — Telephone Encounter (Signed)
  STOP BANG RISK ASSESSMENT S (snore) Have you been told that you snore?     YES   T (tired) Are you often tired, fatigued, or sleepy during the day?   YES  O (obstruction) Do you stop breathing, choke, or gasp during sleep? YES   P (pressure) Do you have or are you being treated for high blood pressure? YES   B (BMI) Is your body index greater than 35 kg/m? NO   A (age) Are you 88 years old or older? YES   N (neck) Do you have a neck circumference greater than 16 inches?   YES  G (gender) Are you a male? YES   TOTAL STOP/BANG "YES" ANSWERS 7                                                                       For Office Use Only              Procedure Order Form    YES to 3+ Stop Bang questions OR two clinical symptoms - patient qualifies for WatchPAT (CPT 95800)             Clinical Notes: Will consult Sleep Specialist and refer for management of therapy due to patient increased risk of Sleep Apnea. Ordering a sleep study due to the following two clinical symptoms: Excessive daytime sleepiness G47.10 / Gastroesophageal reflux K21.9 / Nocturia R35.1 / Morning Headaches G44.221 / Difficulty concentrating R41.840 / Memory problems or poor judgment G31.84 / Personality changes or irritability R45.4 / Loud snoring R06.83 / Depression F32.9 / Unrefreshed by sleep G47.8 / Impotence N52.9 / History of high blood pressure R03.0 / Insomnia G47.00    I understand that I am proceeding with a home sleep apnea test as ordered by my treating physician. I understand that untreated sleep apnea is a serious cardiovascular risk factor and it is my responsibility to perform the test and seek management for sleep apnea. I will be contacted with the results and be managed for sleep apnea by a local sleep physician. I will be receiving equipment and further instructions from [facility] I shall promptly ship back the equipment via the included mailing label. I understand my insurance will be billed for the  test and as the patient I am responsible for any insurance related out-of-pocket costs incurred. I have been provided with written instructions and can call for additional video or telephonic instruction, with 24-hour availability of qualified personnel to answer any questions: Patient Help Desk [phone].  Patient Signature ______________________________________________________   Date______________________ Patient Telemedicine Verbal Consent

## 2024-07-29 NOTE — Telephone Encounter (Signed)
 Patient states he is returning a call.  Please advise.

## 2024-07-29 NOTE — Telephone Encounter (Signed)
 The patient has been notified of the result and verbalized understanding.  All questions (if any) were answered. Alpa Salvo Chauvigne, RN 07/29/2024 12:05 PM  Sleep study has been ordered.    Charlies Macario Arthur, PA-C 07/15/2024  5:24 PM EDT     Monitor noted average HR 59 Slowest HRs occurred in the early morning/asleep hours. Findings overnight/early morning are perhaps suggestive of sleep apnea Low PVC burden   Recommend he get sleep study done

## 2024-07-29 NOTE — Telephone Encounter (Signed)
-----   Message from Nurse Doreatha C sent at 07/29/2024 12:06 PM EDT ----- [place] sleep study has been ordered. SB - 7.  Thanks! Carly

## 2024-08-01 ENCOUNTER — Encounter: Payer: Self-pay | Admitting: *Deleted

## 2024-08-08 DIAGNOSIS — M7062 Trochanteric bursitis, left hip: Secondary | ICD-10-CM | POA: Diagnosis not present

## 2024-08-12 DIAGNOSIS — L820 Inflamed seborrheic keratosis: Secondary | ICD-10-CM | POA: Diagnosis not present

## 2024-08-12 DIAGNOSIS — L578 Other skin changes due to chronic exposure to nonionizing radiation: Secondary | ICD-10-CM | POA: Diagnosis not present

## 2024-08-12 DIAGNOSIS — Z85828 Personal history of other malignant neoplasm of skin: Secondary | ICD-10-CM | POA: Diagnosis not present

## 2024-08-12 DIAGNOSIS — D485 Neoplasm of uncertain behavior of skin: Secondary | ICD-10-CM | POA: Diagnosis not present

## 2024-08-12 DIAGNOSIS — D229 Melanocytic nevi, unspecified: Secondary | ICD-10-CM | POA: Diagnosis not present

## 2024-08-12 DIAGNOSIS — L570 Actinic keratosis: Secondary | ICD-10-CM | POA: Diagnosis not present

## 2024-08-12 DIAGNOSIS — L821 Other seborrheic keratosis: Secondary | ICD-10-CM | POA: Diagnosis not present

## 2024-08-12 DIAGNOSIS — Z85820 Personal history of malignant melanoma of skin: Secondary | ICD-10-CM | POA: Diagnosis not present

## 2024-08-12 DIAGNOSIS — D034 Melanoma in situ of scalp and neck: Secondary | ICD-10-CM | POA: Diagnosis not present

## 2024-08-28 DIAGNOSIS — D034 Melanoma in situ of scalp and neck: Secondary | ICD-10-CM | POA: Diagnosis not present

## 2024-09-25 ENCOUNTER — Other Ambulatory Visit: Payer: Self-pay | Admitting: Internal Medicine

## 2024-10-04 ENCOUNTER — Encounter: Payer: Self-pay | Admitting: Cardiovascular Disease

## 2024-10-04 ENCOUNTER — Ambulatory Visit: Attending: Cardiovascular Disease | Admitting: Cardiovascular Disease

## 2024-10-04 VITALS — BP 160/71 | HR 52 | Resp 16 | Ht 70.0 in | Wt 181.8 lb

## 2024-10-04 DIAGNOSIS — I493 Ventricular premature depolarization: Secondary | ICD-10-CM | POA: Diagnosis not present

## 2024-10-04 DIAGNOSIS — I351 Nonrheumatic aortic (valve) insufficiency: Secondary | ICD-10-CM

## 2024-10-04 DIAGNOSIS — I10 Essential (primary) hypertension: Secondary | ICD-10-CM | POA: Diagnosis not present

## 2024-10-04 NOTE — Progress Notes (Signed)
 Cardiology Office Note:    Date:  10/04/2024   ID:  Paul Parks, DOB January 06, 1936, MRN [id number]  PCP:  Jolinda Norene HERO, DO   [facility] Providers Cardiologist:  None Electrophysiologist:  Danelle Birmingham, MD     Referring MD: Jolinda Norene HERO, DO   Chief Complaint  Patient presents with   Establish Care    History of Present Illness:    Paul Parks is a 88 y.o. male who has been followed by Dr. Birmingham, presenting to establish care.  The patient has been noted to have aortic insufficiency.  He has a history of sinus node dysfunction with recommendations to avoid AV nodal blocking agents.  He also has a history of PVCs and underwent PVC ablation in 2022.  The patient is here with his wife today.  He has been doing pretty well.  He denies any chest pain or shortness of breath.  He denies lightheadedness, heart palpitations, or syncope.  We note that his blood pressure is elevated today, but he says this is very unusual for him.  His blood pressure usually runs in the range of 120/60 mmHg.  Patient has no cardiac-related complaints today.   Current Medications: Current Meds  Medication Sig   acetaminophen  (TYLENOL ) 500 MG tablet Take 500 mg by mouth every 6 (six) hours as needed for mild pain (pain score 1-3) or moderate pain (pain score 4-6).   amLODipine  (NORVASC ) 5 MG tablet TAKE 1 TABLET (5 MG TOTAL) BY MOUTH DAILY.   cholecalciferol (VITAMIN D ) 1000 UNITS tablet Take 1,000 Units by mouth daily.   COMBIGAN 0.2-0.5 % ophthalmic solution Apply 1 drop to eye 2 (two) times daily.   doxycycline  (VIBRA -TABS) 100 MG tablet Take 2 tablets once per episode of tick attached for more than 3 days or if engorged   meclizine  (ANTIVERT ) 12.5 MG tablet Take 12.5 mg by mouth 4 (four) times daily as needed for dizziness.   meloxicam  (MOBIC ) 15 MG tablet TAKE 1/2 TO 1 TABLET (7.5-15 MG TOTAL) BY MOUTH DAILY AS NEEDED FOR PAIN   Multiple Vitamin (MULTIVITAMIN WITH MINERALS) TABS  tablet Take 1 tablet by mouth daily.   Omega-3 Fatty Acids (OMEGA-3 FISH OIL) 1200 MG CAPS Take 1,200 mg by mouth as needed.   rosuvastatin  (CRESTOR ) 10 MG tablet TAKE 1 TABLET BY MOUTH 2 TIMES A WEEK   tadalafil  (CIALIS ) 10 MG tablet Take 1 tablet (10 mg total) by mouth daily as needed for erectile dysfunction.     Allergies:   Sulfa antibiotics, Ibuprofen , Gabapentin, and Vesicare [solifenacin]   ROS:   Please see the history of present illness.    All other systems reviewed and are negative.  EKGs/Labs/Other Studies Reviewed:    The following studies were reviewed today: Cardiac Studies & Procedures   ______________________________________________________________________________________________     ECHOCARDIOGRAM  ECHOCARDIOGRAM COMPLETE 06/12/2024  Narrative ECHOCARDIOGRAM REPORT    Patient Name:   Paul Parks Date of Exam: 06/12/2024 Medical Rec #:  [id number]      Height:       70.5 in Accession #:    [id number]     Weight:       175.0 lb Date of Birth:  [birth year]      BSA:          1.983 m Patient Age:    87 years       BP:           159/73 mmHg Patient Gender: M  HR:           50 bpm. Exam Location:  [address]  Procedure: 2D Echo, 3D Echo, Cardiac Doppler, Color Doppler and Strain Analysis (Both Spectral and Color Flow Doppler were utilized during procedure).  Indications:    I35.0 Aortic Insifficiency  History:        Patient has prior history of Echocardiogram examinations, most recent 08/31/2023. Arrythmias:PVC; Risk Factors:Hypertension and HLV.  Sonographer:    Waldo Guadalajara RCS Referring Phys: [id number] RENEE LYNN URSUY  IMPRESSIONS   1. Left ventricular ejection fraction, by estimation, is 60 to 65%. Left ventricular ejection fraction by 3D volume is 61 %. The left ventricle has normal function. The left ventricle has no regional wall motion abnormalities. There is moderate concentric left ventricular hypertrophy. Left ventricular  diastolic parameters are consistent with Grade I diastolic dysfunction (impaired relaxation). The average left ventricular global longitudinal strain is -18.9 %. The global longitudinal strain is normal. 2. Right ventricular systolic function is normal. The right ventricular size is normal. There is normal pulmonary artery systolic pressure. The estimated right ventricular systolic pressure is 23.2 mmHg. 3. The mitral valve is normal in structure. Trivial mitral valve regurgitation. No evidence of mitral stenosis. 4. The aortic valve is tricuspid. Aortic valve regurgitation is mild to moderate. Aortic valve sclerosis/calcification is present, without any evidence of aortic stenosis. Aortic regurgitation PHT measures 530 msec. 5. There is mild dilatation of the aortic root, measuring 40 mm. There is mild dilatation of the ascending aorta, measuring 40 mm. 6. The inferior vena cava is normal in size with greater than 50% respiratory variability, suggesting right atrial pressure of 3 mmHg.  FINDINGS Left Ventricle: Left ventricular ejection fraction, by estimation, is 60 to 65%. Left ventricular ejection fraction by 3D volume is 61 %. The left ventricle has normal function. The left ventricle has no regional wall motion abnormalities. The average left ventricular global longitudinal strain is -18.9 %. Strain was performed and the global longitudinal strain is normal. The left ventricular internal cavity size was normal in size. There is moderate concentric left ventricular hypertrophy. Left ventricular diastolic parameters are consistent with Grade I diastolic dysfunction (impaired relaxation). Normal left ventricular filling pressure.  Right Ventricle: The right ventricular size is normal. No increase in right ventricular wall thickness. Right ventricular systolic function is normal. There is normal pulmonary artery systolic pressure. The tricuspid regurgitant velocity is 2.25 m/s, and with an assumed  right atrial pressure of 3 mmHg, the estimated right ventricular systolic pressure is 23.2 mmHg.  Left Atrium: Left atrial size was normal in size.  Right Atrium: Right atrial size was normal in size.  Pericardium: There is no evidence of pericardial effusion.  Mitral Valve: The mitral valve is normal in structure. Trivial mitral valve regurgitation. No evidence of mitral valve stenosis.  Tricuspid Valve: The tricuspid valve is normal in structure. Tricuspid valve regurgitation is mild . No evidence of tricuspid stenosis.  Aortic Valve: The aortic valve is tricuspid. Aortic valve regurgitation is mild to moderate. Aortic regurgitation PHT measures 530 msec. Aortic valve sclerosis/calcification is present, without any evidence of aortic stenosis.  Pulmonic Valve: The pulmonic valve was normal in structure. Pulmonic valve regurgitation is trivial. No evidence of pulmonic stenosis.  Aorta: The aortic root is normal in size and structure. There is mild dilatation of the aortic root, measuring 40 mm. There is mild dilatation of the ascending aorta, measuring 40 mm.  Venous: The inferior vena cava is normal in size  with greater than 50% respiratory variability, suggesting right atrial pressure of 3 mmHg.  IAS/Shunts: No atrial level shunt detected by color flow Doppler.  Additional Comments: 3D was performed not requiring image post processing on an independent workstation and was normal.   LEFT VENTRICLE PLAX 2D LVIDd:         4.70 cm         Diastology LVIDs:         3.00 cm         LV e' medial:    7.62 cm/s LV PW:         1.30 cm         LV E/e' medial:  7.3 LV IVS:        1.50 cm         LV e' lateral:   7.72 cm/s LVOT diam:     1.80 cm         LV E/e' lateral: 7.2 LV SV:         76 LV SV Index:   38              2D Longitudinal LVOT Area:     2.54 cm        Strain 2D Strain GLS   -17.5 % (A4C): 2D Strain GLS   -20.6 % (A3C): 2D Strain GLS   -18.7 % (A2C): 2D Strain GLS    -18.9 % Avg:  3D Volume EF LV 3D EF:    Left ventricul ar ejection fraction by 3D volume is 61 %.  3D Volume EF: 3D EF:        61 % LV EDV:       179 ml LV ESV:       70 ml LV SV:        109 ml  RIGHT VENTRICLE RV Basal diam:  3.50 cm RV S prime:     13.80 cm/s TAPSE (M-mode): 1.9 cm RVSP:           23.2 mmHg  LEFT ATRIUM             Index        RIGHT ATRIUM           Index LA diam:        3.30 cm 1.66 cm/m   RA Pressure: 3.00 mmHg LA Vol (A2C):   70.4 ml 35.51 ml/m  RA Area:     18.30 cm LA Vol (A4C):   43.8 ml 22.09 ml/m  RA Volume:   48.70 ml  24.56 ml/m LA Biplane Vol: 58.4 ml 29.46 ml/m AORTIC VALVE LVOT Vmax:   128.00 cm/s LVOT Vmean:  87.000 cm/s LVOT VTI:    0.299 m AI PHT:      530 msec  AORTA Ao Root diam: 4.00 cm Ao Asc diam:  4.00 cm  MITRAL VALVE                TRICUSPID VALVE MV Area (PHT):              TR Peak grad:   20.2 mmHg MV Decel Time:              TR Vmax:        225.00 cm/s MV E velocity: 55.50 cm/s   Estimated RAP:  3.00 mmHg MV A velocity: 101.00 cm/s  RVSP:           23.2 mmHg MV E/A ratio:  0.55 SHUNTS Systemic VTI:  0.30 m Systemic Diam: 1.80 cm  Wilbert Bihari MD Electronically signed by Wilbert Bihari MD Signature Date/Time: 06/12/2024/12:41:50 PM    Final    MONITORS  LONG TERM MONITOR (3-14 DAYS) 07/09/2024  Narrative NSR with sinus brady (40/min) and sinus tachy (110), ave 59/min. 2 episodes of NS SVT, longest 15 beats at 131/min. Nocturnal episodes of CHB, lasting less than 2 minutes Rare PVC's and PAC's (<1%). No VT, atrial fib or sustained SVT No symptoms recorded Patch Wear Time:  7 days and 7 hours (2025-07-15T14:44:55-0400 to 2025-07-22T22:39:40-0400)       ______________________________________________________________________________________________      EKG:        Recent Labs: 12/15/2023: ALT 13; BUN 13; Creatinine, Ser 1.13; Hemoglobin 13.2; Platelets 168; Potassium 4.4; Sodium 138; TSH 2.580   Recent Lipid Panel    Component Value Date/Time   CHOL 171 12/15/2023 0824   CHOL 136 05/30/2013 0944   TRIG 75 12/15/2023 0824   TRIG 91 05/18/2017 0909   TRIG 100 05/30/2013 0944   HDL 76 12/15/2023 0824   HDL 58 05/18/2017 0909   HDL 48 05/30/2013 0944   CHOLHDL 2.3 12/15/2023 0824   LDLCALC 81 12/15/2023 0824   LDLCALC 82 09/24/2014 1028   LDLCALC 68 05/30/2013 0944           Physical Exam:    VS:  BP (!) 160/71 (BP Location: Left Arm, Patient Position: Sitting, Cuff Size: Normal)   Pulse (!) 52   Resp 16   Ht 5' 10 (1.778 m)   Wt 181 lb 12.8 oz (82.5 kg)   SpO2 98%   BMI 26.09 kg/m     Wt Readings from Last 3 Encounters:  10/04/24 181 lb 12.8 oz (82.5 kg)  06/14/24 177 lb 9.6 oz (80.6 kg)  06/14/24 174 lb 6.4 oz (79.1 kg)     GEN:  Well nourished, well developed in no acute distress HEENT: Normal NECK: No JVD; No carotid bruits LYMPHATICS: No lymphadenopathy CARDIAC: RRR, 2/6 systolic ejection murmur at the right upper sternal border and 2/6 diastolic decrescendo murmur also at the right upper sternal border RESPIRATORY:  Clear to auscultation without rales, wheezing or rhonchi  ABDOMEN: Soft, non-tender, non-distended MUSCULOSKELETAL:  No edema; No deformity  SKIN: Warm and dry NEUROLOGIC:  Alert and oriented x 3 PSYCHIATRIC:  Normal affect   Assessment & Plan Nonrheumatic aortic (valve) insufficiency I personally reviewed the patient's recent echo images.  I think he has moderate AI and agree with the interpretation.  His previous echo from 2024 was read as severe AI, but he does not have physical exam or echo findings consistent with this.  The patient is asymptomatic.  His LV size and function are normal.  Recommend repeat echocardiogram next year. Essential hypertension Blood pressure has been controlled on his home readings.  I repeated his blood pressure today and got a similar reading of 164/70 mmHg.  I asked him to monitor his blood pressure at  least a few days per week and to continue his current management of amlodipine  5 mg daily.  Advised him to contact us  if he is seeing any readings greater than 150/90 mmHg (age 89).  PVC's (premature ventricular contractions) He has been followed by our EP service and has undergone previous PVC ablation.  Recent monitor reviewed and showed a PVC burden of less than 1%.            Medication Adjustments/Labs and Tests Ordered: Current medicines are reviewed at length with  the patient today.  Concerns regarding medicines are outlined above.  Orders Placed This Encounter  Procedures   ECHOCARDIOGRAM COMPLETE   No orders of the defined types were placed in this encounter.   Patient Instructions  Medication Instructions:  No medication changes were made at this visit. Continue current regimen.   *If you need a refill on your cardiac medications before your next appointment, please call your pharmacy*  Lab Work: None ordered today. If you have labs (blood work) drawn today and your tests are completely normal, you will receive your results only by: MyChart Message (if you have MyChart) OR A paper copy in the mail If you have any lab test that is abnormal or we need to change your treatment, we will call you to review the results.  Testing/Procedures: Your physician has requested that you have an echocardiogram prior to 1 year follow-up. Echocardiography is a painless test that uses sound waves to create images of your heart. It provides your doctor with information about the size and shape of your heart and how well your heart's chambers and valves are working. This procedure takes approximately one hour. There are no restrictions for this procedure. Please do NOT wear cologne, perfume, aftershave, or lotions (deodorant is allowed). Please arrive 15 minutes prior to your appointment time.  Please note: We ask at that you not bring children with you during ultrasound (echo/ vascular)  testing. Due to room size and safety concerns, children are not allowed in the ultrasound rooms during exams. Our front office staff cannot provide observation of children in our lobby area while testing is being conducted. An adult accompanying a patient to their appointment will only be allowed in the ultrasound room at the discretion of the ultrasound technician under special circumstances. We apologize for any inconvenience.   Follow-Up: At [facility], you and your health needs are our priority.  As part of our continuing mission to provide you with exceptional heart care, our providers are all part of one team.  This team includes your primary Cardiologist (physician) and Advanced Practice Providers or APPs (Physician Assistants and Nurse Practitioners) who all work together to provide you with the care you need, when you need it.  Your next appointment:   1 year(s)  Provider:   Dr. Wonda    We recommend signing up for the patient portal called MyChart.  Sign up information is provided on this After Visit Summary.  MyChart is used to connect with patients for Virtual Visits (Telemedicine).  Patients are able to view lab/test results, encounter notes, upcoming appointments, etc.  Non-urgent messages can be sent to your provider as well.   To learn more about what you can do with MyChart, go to [email or web address].   Other Instructions Please take home blood pressures. Goal is to keep the systolic (top number) under 150 and diastolic (bottom number) under 90. Please call our office if you are consistently getting higher readings than these goal numbers.    Signed, Ozell Wonda, MD  10/04/2024 3:11 PM    [facility]

## 2024-10-04 NOTE — Assessment & Plan Note (Signed)
 Blood pressure has been controlled on his home readings.  I repeated his blood pressure today and got a similar reading of 164/70 mmHg.  I asked him to monitor his blood pressure at least a few days per week and to continue his current management of amlodipine  5 mg daily.  Advised him to contact us  if he is seeing any readings greater than 150/90 mmHg (age 88).

## 2024-10-04 NOTE — Assessment & Plan Note (Signed)
 I personally reviewed the patient's recent echo images.  I think he has moderate AI and agree with the interpretation.  His previous echo from 2024 was read as severe AI, but he does not have physical exam or echo findings consistent with this.  The patient is asymptomatic.  His LV size and function are normal.  Recommend repeat echocardiogram next year.

## 2024-10-04 NOTE — Patient Instructions (Signed)
 Medication Instructions:  No medication changes were made at this visit. Continue current regimen.   *If you need a refill on your cardiac medications before your next appointment, please call your pharmacy*  Lab Work: None ordered today. If you have labs (blood work) drawn today and your tests are completely normal, you will receive your results only by: MyChart Message (if you have MyChart) OR A paper copy in the mail If you have any lab test that is abnormal or we need to change your treatment, we will call you to review the results.  Testing/Procedures: Your physician has requested that you have an echocardiogram prior to 1 year follow-up. Echocardiography is a painless test that uses sound waves to create images of your heart. It provides your doctor with information about the size and shape of your heart and how well your heart's chambers and valves are working. This procedure takes approximately one hour. There are no restrictions for this procedure. Please do NOT wear cologne, perfume, aftershave, or lotions (deodorant is allowed). Please arrive 15 minutes prior to your appointment time.  Please note: We ask at that you not bring children with you during ultrasound (echo/ vascular) testing. Due to room size and safety concerns, children are not allowed in the ultrasound rooms during exams. Our front office staff cannot provide observation of children in our lobby area while testing is being conducted. An adult accompanying a patient to their appointment will only be allowed in the ultrasound room at the discretion of the ultrasound technician under special circumstances. We apologize for any inconvenience.   Follow-Up: At [facility], you and your health needs are our priority.  As part of our continuing mission to provide you with exceptional heart care, our providers are all part of one team.  This team includes your primary Cardiologist (physician) and Advanced Practice  Providers or APPs (Physician Assistants and Nurse Practitioners) who all work together to provide you with the care you need, when you need it.  Your next appointment:   1 year(s)  Provider:   Dr. Wonda    We recommend signing up for the patient portal called MyChart.  Sign up information is provided on this After Visit Summary.  MyChart is used to connect with patients for Virtual Visits (Telemedicine).  Patients are able to view lab/test results, encounter notes, upcoming appointments, etc.  Non-urgent messages can be sent to your provider as well.   To learn more about what you can do with MyChart, go to [email or web address].   Other Instructions Please take home blood pressures. Goal is to keep the systolic (top number) under 150 and diastolic (bottom number) under 90. Please call our office if you are consistently getting higher readings than these goal numbers.

## 2024-10-04 NOTE — Assessment & Plan Note (Signed)
 He has been followed by our EP service and has undergone previous PVC ablation.  Recent monitor reviewed and showed a PVC burden of less than 1%.

## 2024-10-05 ENCOUNTER — Encounter (HOSPITAL_BASED_OUTPATIENT_CLINIC_OR_DEPARTMENT_OTHER): Payer: Self-pay | Admitting: Cardiology

## 2024-10-05 DIAGNOSIS — G4733 Obstructive sleep apnea (adult) (pediatric): Secondary | ICD-10-CM | POA: Diagnosis not present

## 2024-10-06 DIAGNOSIS — R454 Irritability and anger: Secondary | ICD-10-CM | POA: Diagnosis not present

## 2024-10-06 DIAGNOSIS — G44021 Chronic cluster headache, intractable: Secondary | ICD-10-CM | POA: Diagnosis not present

## 2024-10-06 DIAGNOSIS — R0683 Snoring: Secondary | ICD-10-CM | POA: Diagnosis not present

## 2024-10-06 DIAGNOSIS — F329 Major depressive disorder, single episode, unspecified: Secondary | ICD-10-CM | POA: Diagnosis not present

## 2024-10-06 DIAGNOSIS — G4730 Sleep apnea, unspecified: Secondary | ICD-10-CM | POA: Diagnosis not present

## 2024-10-06 DIAGNOSIS — R030 Elevated blood-pressure reading, without diagnosis of hypertension: Secondary | ICD-10-CM | POA: Diagnosis not present

## 2024-10-06 DIAGNOSIS — G478 Other sleep disorders: Secondary | ICD-10-CM | POA: Diagnosis not present

## 2024-10-06 DIAGNOSIS — G4710 Hypersomnia, unspecified: Secondary | ICD-10-CM | POA: Diagnosis not present

## 2024-10-06 DIAGNOSIS — R41840 Attention and concentration deficit: Secondary | ICD-10-CM | POA: Diagnosis not present

## 2024-10-06 NOTE — Procedures (Signed)
   Sleep Study Report Patient Name: Paul Parks  ID: [id number] Birth Date: 1936-03-30  Age: 88  Gender: Male Study Date:10/06/2024 Referring Physician: Charlies Martes, PA  TEST DESCRIPTION: Home sleep apnea testing was completed using the WatchPat, a Type 1 device, utilizing peripheral arterial tonometry (PAT), chest movement, actigraphy, pulse oximetry, pulse rate, body position and snore. AHI was calculated with apnea and hypopnea using valid sleep time as the denominator. RDI includes apneas, hypopneas, and RERAs. The data acquired and the scoring of sleep and all associated events were performed in accordance with the recommended standards and specifications as outlined in the AASM Manual for the Scoring of Sleep and Associated Events 2.2.0 (2015).  FINDINGS:  1. Mild Obstructive Sleep Apnea with AHI 9.1/hr overall and moderate during REM sleep with REM AHI 12.9/hr.  2. No Central Sleep Apnea with pAHIc 0/hr.  3. Oxygen desaturations as low as 85%.  4. Moderate snoring was present. O2 sats were < 88% for 0.9 min.  5. Total sleep time was 6 hrs and 34 min.  6. 25% of total sleep time was spent in REM sleep.  7. Shortened sleep onset latency at 6 min.  8. Normal REM sleep onset latency at 94 min.  9. Total awakenings were 10. 10. Arrhythmia detection: None  DIAGNOSIS: Mild Obstructive Sleep Apnea (G47.33) overall but moderate during REM sleep  Recommendations 1. Clinical correlation of these findings is necessary. The decision to treat obstructive sleep apnea (OSA) is usually based on the presence of apnea symptoms or the presence of associated medical conditions such as Hypertension, Congestive Heart Failure, Atrial Fibrillation or Obesity. The most common symptoms of OSA are snoring, gasping for breath while sleeping, daytime sleepiness and fatigue. 2. Initiating apnea therapy is recommended given the presence of symptoms and/or associated conditions. Recommend proceeding  with one of the following:  a. Auto-CPAP therapy with a pressure range of 5-20cm H2O.  b. An oral appliance (OA) that can be obtained from certain dentists with expertise in sleep medicine. These are primarily of use in non-obese patients with mild and moderate disease.  c. An ENT consultation which may be useful to look for specific causes of obstruction and possible treatment options.  d. If patient is intolerant to PAP therapy, consider referral to ENT for evaluation for hypoglossal nerve stimulator. 3. Close follow-up is necessary to ensure success with CPAP or oral appliance therapy for maximum benefit . 4. A follow-up oximetry study on CPAP is recommended to assess the adequacy of therapy and determine the need for supplemental oxygen or the potential need for Bi-level therapy. An arterial blood gas to determine the adequacy of baseline ventilation and oxygenation should also be considered. 5. Healthy sleep recommendations include: adequate nightly sleep (normal 7-9 hrs/night), avoidance of caffeine after noon and alcohol near bedtime, and maintaining a sleep environment that is cool, dark and quiet. 6. Weight loss for overweight patients is recommended. Even modest amounts of weight loss can significantly improve the severity of sleep apnea. 7. Snoring recommendations include: weight loss where appropriate, side sleeping, and avoidance of alcohol before bed. 8. Operation of motor vehicle should be avoided when sleepy.  Report prepared by: Signature: Electronically Signed: Wilbert Bihari, MD, [facility] Diplomat American Board of Sleep Medicine

## 2024-10-07 ENCOUNTER — Ambulatory Visit: Attending: Physician Assistant

## 2024-10-07 DIAGNOSIS — R001 Bradycardia, unspecified: Secondary | ICD-10-CM

## 2024-10-16 ENCOUNTER — Telehealth: Payer: Self-pay | Admitting: *Deleted

## 2024-10-16 DIAGNOSIS — I10 Essential (primary) hypertension: Secondary | ICD-10-CM

## 2024-10-16 DIAGNOSIS — G4733 Obstructive sleep apnea (adult) (pediatric): Secondary | ICD-10-CM

## 2024-10-16 NOTE — Telephone Encounter (Signed)
 The patient has been notified of the result and verbalized understanding.  All questions (if any) were answered. Joshua Dalton Seip, CMA 10/16/2024 12:17 PM    Upon patient request DME selection is [facility]. Patient understands he will be contacted by [facility] to set up his cpap. Patient understands to call if [facility] does not contact him with new setup in a timely manner. Patient understands they will be called once confirmation has been received from [facility] that they have received their new machine to schedule 10 week follow up appointment.   [facility] notified of new cpap order  Please add to airview Patient was grateful for the call and thanked me.

## 2024-10-16 NOTE — Telephone Encounter (Signed)
-----   Message from Wilbert Bihari sent at 10/06/2024  7:40 PM EST ----- Please let patient know that they have sleep apnea and recommend treating with CPAP.  Please order an auto CPAP from 4-15cm H2O with heated humidity and mask of choice.  Order overnight pulse ox on CPAP.  Followup with me in 6 weeks.

## 2024-10-18 ENCOUNTER — Encounter: Payer: Self-pay | Admitting: *Deleted

## 2024-10-18 DIAGNOSIS — G4733 Obstructive sleep apnea (adult) (pediatric): Secondary | ICD-10-CM

## 2024-10-18 DIAGNOSIS — I10 Essential (primary) hypertension: Secondary | ICD-10-CM

## 2024-10-18 NOTE — Telephone Encounter (Signed)
-----   Message from Wilbert Bihari sent at 10/06/2024  7:40 PM EST ----- Please let patient know that they have sleep apnea and recommend treating with CPAP.  Please order an auto CPAP from 4-15cm H2O with heated humidity and mask of choice.  Order overnight pulse ox on CPAP.  Followup with me in 6 weeks.

## 2024-10-18 NOTE — Telephone Encounter (Signed)
 This encounter was created in error - please disregard.

## 2024-10-21 ENCOUNTER — Telehealth: Payer: Self-pay | Admitting: *Deleted

## 2024-10-21 NOTE — Telephone Encounter (Signed)
-----   Message from Wilbert Bihari sent at 10/06/2024  7:40 PM EST ----- Please let patient know that they have sleep apnea and recommend treating with CPAP.  Please order an auto CPAP from 4-15cm H2O with heated humidity and mask of choice.  Order overnight pulse ox on CPAP.  Followup with me in 6 weeks.        Upon patient request DME selection is [facility]. Patient understands he will be contacted by [facility] to set up his cpap. Patient understands to call if [facility] does not contact him with new setup in a timely manner. Patient understands they will be called once confirmation has been received from [facility] that they have received their new machine to schedule 10 week follow up appointment.   [facility] notified of new cpap order  Please add to airview Patient was grateful for the call and thanked me.

## 2024-10-22 DIAGNOSIS — Z483 Aftercare following surgery for neoplasm: Secondary | ICD-10-CM | POA: Diagnosis not present

## 2024-11-04 DIAGNOSIS — Z961 Presence of intraocular lens: Secondary | ICD-10-CM | POA: Diagnosis not present

## 2024-11-04 DIAGNOSIS — H401113 Primary open-angle glaucoma, right eye, severe stage: Secondary | ICD-10-CM | POA: Diagnosis not present

## 2024-11-04 DIAGNOSIS — H401122 Primary open-angle glaucoma, left eye, moderate stage: Secondary | ICD-10-CM | POA: Diagnosis not present

## 2024-11-04 DIAGNOSIS — H53462 Homonymous bilateral field defects, left side: Secondary | ICD-10-CM | POA: Diagnosis not present

## 2024-11-25 ENCOUNTER — Other Ambulatory Visit: Payer: Self-pay | Admitting: Family Medicine

## 2024-12-09 ENCOUNTER — Ambulatory Visit (INDEPENDENT_AMBULATORY_CARE_PROVIDER_SITE_OTHER)

## 2024-12-09 DIAGNOSIS — Z23 Encounter for immunization: Secondary | ICD-10-CM | POA: Diagnosis not present

## 2024-12-20 ENCOUNTER — Other Ambulatory Visit

## 2024-12-20 DIAGNOSIS — I10 Essential (primary) hypertension: Secondary | ICD-10-CM

## 2024-12-20 DIAGNOSIS — R5383 Other fatigue: Secondary | ICD-10-CM

## 2024-12-20 DIAGNOSIS — Z0000 Encounter for general adult medical examination without abnormal findings: Secondary | ICD-10-CM

## 2024-12-20 DIAGNOSIS — E559 Vitamin D deficiency, unspecified: Secondary | ICD-10-CM

## 2024-12-21 LAB — ANEMIA PROFILE B
Basophils Absolute: 0 x10E3/uL (ref 0.0–0.2)
Basos: 1 %
EOS (ABSOLUTE): 0.1 x10E3/uL (ref 0.0–0.4)
Eos: 2 %
Ferritin: 64 ng/mL (ref 30–400)
Folate: >20 ng/mL
Hematocrit: 38.7 % (ref 37.5–51.0)
Hemoglobin: 13 g/dL (ref 13.0–17.7)
Immature Grans (Abs): 0 x10E3/uL (ref 0.0–0.1)
Immature Granulocytes: 0 %
Iron Saturation: 42 % (ref 15–55)
Iron: 111 ug/dL (ref 38–169)
Lymphocytes Absolute: 1.9 x10E3/uL (ref 0.7–3.1)
Lymphs: 35 %
MCH: 32.3 pg (ref 26.6–33.0)
MCHC: 33.6 g/dL (ref 31.5–35.7)
MCV: 96 fL (ref 79–97)
Monocytes Absolute: 0.4 x10E3/uL (ref 0.1–0.9)
Monocytes: 7 %
Neutrophils Absolute: 3 x10E3/uL (ref 1.4–7.0)
Neutrophils: 55 %
Platelets: 157 x10E3/uL (ref 150–450)
RBC: 4.03 x10E6/uL — ABNORMAL LOW (ref 4.14–5.80)
RDW: 12.2 % (ref 11.6–15.4)
Retic Ct Pct: 1.4 % (ref 0.6–2.6)
Total Iron Binding Capacity: 264 ug/dL (ref 250–450)
UIBC: 153 ug/dL (ref 111–343)
Vitamin B-12: 527 pg/mL (ref 232–1245)
WBC: 5.5 x10E3/uL (ref 3.4–10.8)

## 2024-12-21 LAB — CMP14+EGFR
ALT: 11 IU/L (ref 0–44)
AST: 18 IU/L (ref 0–40)
Albumin: 4.3 g/dL (ref 3.7–4.7)
Alkaline Phosphatase: 51 IU/L (ref 48–129)
BUN/Creatinine Ratio: 14 (ref 10–24)
BUN: 16 mg/dL (ref 8–27)
Bilirubin Total: 0.6 mg/dL (ref 0.0–1.2)
CO2: 24 mmol/L (ref 20–29)
Calcium: 9.2 mg/dL (ref 8.6–10.2)
Chloride: 100 mmol/L (ref 96–106)
Creatinine, Ser: 1.11 mg/dL (ref 0.76–1.27)
Globulin, Total: 1.6 g/dL (ref 1.5–4.5)
Glucose: 91 mg/dL (ref 70–99)
Potassium: 4.7 mmol/L (ref 3.5–5.2)
Sodium: 135 mmol/L (ref 134–144)
Total Protein: 5.9 g/dL — ABNORMAL LOW (ref 6.0–8.5)
eGFR: 65 mL/min/1.73

## 2024-12-21 LAB — LIPID PANEL
Chol/HDL Ratio: 2.5 ratio (ref 0.0–5.0)
Cholesterol, Total: 186 mg/dL (ref 100–199)
HDL: 74 mg/dL
LDL Chol Calc (NIH): 96 mg/dL (ref 0–99)
Triglycerides: 88 mg/dL (ref 0–149)
VLDL Cholesterol Cal: 16 mg/dL (ref 5–40)

## 2024-12-21 LAB — VITAMIN D 25 HYDROXY (VIT D DEFICIENCY, FRACTURES): Vit D, 25-Hydroxy: 37.3 ng/mL (ref 30.0–100.0)

## 2024-12-21 LAB — TSH: TSH: 2.72 u[IU]/mL (ref 0.450–4.500)

## 2024-12-23 ENCOUNTER — Ambulatory Visit: Payer: Self-pay | Admitting: Family Medicine

## 2024-12-25 ENCOUNTER — Ambulatory Visit: Payer: Self-pay | Admitting: Family Medicine

## 2024-12-25 ENCOUNTER — Other Ambulatory Visit: Payer: Self-pay

## 2024-12-25 ENCOUNTER — Encounter: Payer: Self-pay | Admitting: Family Medicine

## 2024-12-25 VITALS — BP 124/62 | HR 49 | Temp 98.5°F | Ht 70.0 in | Wt 181.0 lb

## 2024-12-25 DIAGNOSIS — I495 Sick sinus syndrome: Secondary | ICD-10-CM | POA: Diagnosis not present

## 2024-12-25 DIAGNOSIS — I351 Nonrheumatic aortic (valve) insufficiency: Secondary | ICD-10-CM | POA: Diagnosis not present

## 2024-12-25 DIAGNOSIS — R5383 Other fatigue: Secondary | ICD-10-CM | POA: Diagnosis not present

## 2024-12-25 DIAGNOSIS — Z0001 Encounter for general adult medical examination with abnormal findings: Secondary | ICD-10-CM | POA: Diagnosis not present

## 2024-12-25 DIAGNOSIS — H01005 Unspecified blepharitis left lower eyelid: Secondary | ICD-10-CM | POA: Diagnosis not present

## 2024-12-25 DIAGNOSIS — I10 Essential (primary) hypertension: Secondary | ICD-10-CM | POA: Diagnosis not present

## 2024-12-25 DIAGNOSIS — E7800 Pure hypercholesterolemia, unspecified: Secondary | ICD-10-CM | POA: Diagnosis not present

## 2024-12-25 DIAGNOSIS — R3915 Urgency of urination: Secondary | ICD-10-CM | POA: Diagnosis not present

## 2024-12-25 DIAGNOSIS — W57XXXA Bitten or stung by nonvenomous insect and other nonvenomous arthropods, initial encounter: Secondary | ICD-10-CM

## 2024-12-25 DIAGNOSIS — I700 Atherosclerosis of aorta: Secondary | ICD-10-CM

## 2024-12-25 DIAGNOSIS — E559 Vitamin D deficiency, unspecified: Secondary | ICD-10-CM | POA: Diagnosis not present

## 2024-12-25 DIAGNOSIS — R350 Frequency of micturition: Secondary | ICD-10-CM | POA: Diagnosis not present

## 2024-12-25 DIAGNOSIS — N401 Enlarged prostate with lower urinary tract symptoms: Secondary | ICD-10-CM | POA: Diagnosis not present

## 2024-12-25 DIAGNOSIS — Z0000 Encounter for general adult medical examination without abnormal findings: Secondary | ICD-10-CM

## 2024-12-25 LAB — URINALYSIS, ROUTINE W REFLEX MICROSCOPIC
Bilirubin, UA: NEGATIVE
Glucose, UA: NEGATIVE
Ketones, UA: NEGATIVE
Leukocytes,UA: NEGATIVE
Nitrite, UA: NEGATIVE
Protein,UA: NEGATIVE
Specific Gravity, UA: 1.01 (ref 1.005–1.030)
Urobilinogen, Ur: 0.2 mg/dL (ref 0.2–1.0)
pH, UA: 6.5 (ref 5.0–7.5)

## 2024-12-25 LAB — MICROSCOPIC EXAMINATION
Bacteria, UA: NONE SEEN
Epithelial Cells (non renal): NONE SEEN /HPF (ref 0–10)
WBC, UA: NONE SEEN /HPF (ref 0–5)
Yeast, UA: NONE SEEN

## 2024-12-25 MED ORDER — ERYTHROMYCIN 5 MG/GM OP OINT: 1.0000 | TOPICAL_OINTMENT | Freq: Three times a day (TID) | OPHTHALMIC | 0 refills | Status: AC

## 2024-12-25 MED ORDER — DOXYCYCLINE HYCLATE 100 MG PO TABS: ORAL_TABLET | ORAL | 0 refills | Status: AC

## 2024-12-25 MED ORDER — ROSUVASTATIN CALCIUM 10 MG PO TABS: ORAL_TABLET | ORAL | 3 refills | Status: AC

## 2024-12-25 MED ORDER — MELOXICAM 15 MG PO TABS: ORAL_TABLET | ORAL | 99 refills | Status: AC

## 2024-12-25 MED ORDER — TADALAFIL 10 MG PO TABS: 10.0000 mg | ORAL_TABLET | Freq: Every day | ORAL | 0 refills | Status: AC | PRN
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
## 2024-12-26 LAB — MICROALBUMIN / CREATININE URINE RATIO
Creatinine, Urine: 64.3 mg/dL
Microalb/Creat Ratio: 5 mg/g{creat} (ref 0–29)
Microalbumin, Urine: 3 ug/mL

## 2024-12-27 LAB — PSA: Prostate Specific Ag, Serum: 1.7 ng/mL (ref 0.0–4.0)

## 2024-12-27 LAB — SPECIMEN STATUS REPORT

## 2025-01-31 ENCOUNTER — Ambulatory Visit: Payer: Self-pay

## 2025-02-04 ENCOUNTER — Ambulatory Visit

## 2025-04-16 ENCOUNTER — Other Ambulatory Visit

## 2025-12-31 ENCOUNTER — Encounter: Admitting: Family Medicine
